# Patient Record
Sex: Female | Born: 1960 | Race: Black or African American | Hispanic: No | Marital: Single | Smoking: Former smoker
Health system: Southern US, Community
[De-identification: ages and names within clinical notes are randomized; demographics above are authoritative.]

## PROBLEM LIST (undated history)

## (undated) DIAGNOSIS — I5020 Unspecified systolic (congestive) heart failure: Secondary | ICD-10-CM

## (undated) DIAGNOSIS — F32A Depression, unspecified: Secondary | ICD-10-CM

## (undated) DIAGNOSIS — I10 Essential (primary) hypertension: Secondary | ICD-10-CM

## (undated) DIAGNOSIS — E063 Autoimmune thyroiditis: Secondary | ICD-10-CM

## (undated) DIAGNOSIS — I213 ST elevation (STEMI) myocardial infarction of unspecified site: Secondary | ICD-10-CM

## (undated) DIAGNOSIS — F1490 Cocaine use, unspecified, uncomplicated: Secondary | ICD-10-CM

## (undated) DIAGNOSIS — F329 Major depressive disorder, single episode, unspecified: Secondary | ICD-10-CM

## (undated) DIAGNOSIS — Z86010 Personal history of colonic polyps: Secondary | ICD-10-CM

## (undated) DIAGNOSIS — E785 Hyperlipidemia, unspecified: Secondary | ICD-10-CM

## (undated) DIAGNOSIS — T7840XA Allergy, unspecified, initial encounter: Secondary | ICD-10-CM

## (undated) DIAGNOSIS — I4901 Ventricular fibrillation: Secondary | ICD-10-CM

## (undated) HISTORY — DX: Depression, unspecified: F32.A

## (undated) HISTORY — DX: Ventricular fibrillation: I49.01

## (undated) HISTORY — DX: Major depressive disorder, single episode, unspecified: F32.9

## (undated) HISTORY — DX: Personal history of colonic polyps: Z86.010

## (undated) HISTORY — DX: Hyperlipidemia, unspecified: E78.5

## (undated) HISTORY — DX: Allergy, unspecified, initial encounter: T78.40XA

---

## 2000-12-18 ENCOUNTER — Emergency Department (HOSPITAL_COMMUNITY): Admission: EM | Admit: 2000-12-18 | Discharge: 2000-12-19 | Payer: Self-pay | Admitting: Emergency Medicine

## 2000-12-19 ENCOUNTER — Encounter: Payer: Self-pay | Admitting: Emergency Medicine

## 2001-04-22 ENCOUNTER — Emergency Department (HOSPITAL_COMMUNITY): Admission: EM | Admit: 2001-04-22 | Discharge: 2001-04-22 | Payer: Self-pay

## 2001-05-18 ENCOUNTER — Inpatient Hospital Stay (HOSPITAL_COMMUNITY): Admission: AD | Admit: 2001-05-18 | Discharge: 2001-05-18 | Payer: Self-pay | Admitting: *Deleted

## 2002-05-10 ENCOUNTER — Emergency Department (HOSPITAL_COMMUNITY): Admission: EM | Admit: 2002-05-10 | Discharge: 2002-05-10 | Payer: Self-pay | Admitting: Emergency Medicine

## 2008-02-04 ENCOUNTER — Emergency Department (HOSPITAL_COMMUNITY): Admission: EM | Admit: 2008-02-04 | Discharge: 2008-02-04 | Payer: Self-pay | Admitting: Emergency Medicine

## 2009-02-22 ENCOUNTER — Emergency Department (HOSPITAL_COMMUNITY): Admission: EM | Admit: 2009-02-22 | Discharge: 2009-02-22 | Payer: Self-pay | Admitting: Emergency Medicine

## 2010-05-17 ENCOUNTER — Encounter: Payer: Self-pay | Admitting: Family Medicine

## 2010-06-06 ENCOUNTER — Ambulatory Visit: Payer: Self-pay | Admitting: Family Medicine

## 2010-06-06 ENCOUNTER — Encounter: Payer: Self-pay | Admitting: Family Medicine

## 2010-06-06 DIAGNOSIS — F17200 Nicotine dependence, unspecified, uncomplicated: Secondary | ICD-10-CM

## 2010-06-06 DIAGNOSIS — E785 Hyperlipidemia, unspecified: Secondary | ICD-10-CM | POA: Insufficient documentation

## 2010-06-06 DIAGNOSIS — N951 Menopausal and female climacteric states: Secondary | ICD-10-CM

## 2010-06-06 DIAGNOSIS — F329 Major depressive disorder, single episode, unspecified: Secondary | ICD-10-CM

## 2010-06-06 DIAGNOSIS — F8189 Other developmental disorders of scholastic skills: Secondary | ICD-10-CM | POA: Insufficient documentation

## 2010-06-06 HISTORY — DX: Menopausal and female climacteric states: N95.1

## 2010-06-29 ENCOUNTER — Ambulatory Visit: Payer: Self-pay | Admitting: Family Medicine

## 2010-06-29 LAB — CONVERTED CEMR LAB: Pap Smear: NEGATIVE

## 2010-07-04 ENCOUNTER — Encounter: Payer: Self-pay | Admitting: Family Medicine

## 2010-09-26 NOTE — Letter (Signed)
Summary: Generic Letter  [facility]  [address]   [place], [place] [address]   Phone: [phone]  Fax: [phone]    07/04/2010  Brooke Rivera [address] [place], [place]  [address]  Dear [name],  Your pap smear results were normal.  We will repeat the test again in 2 years.    I saw that you missed your lab appointment to have your cholesterol checked.  Please call the office and make an appointment for first thing in the morning to have your lab drawn.  Remember, you should not eat breakfast before this lab test.  This test will help me know if you are on the right amount of cholesterol medicine.    Please also remember to call and have a mammogram scheduled. I will send you another letter with your cholesterol results after you have the labs drawn.  Please contact the office if you have any questions.    Sincerely,   Ardyth Gal MD  Appended Document: Generic Letter mailed

## 2010-09-26 NOTE — Miscellaneous (Signed)
Summary: [facility]  ROI   Imported By: Clydell Hakim 06/08/2010 11:50:58  _____________________________________________________________________  External Attachment:    Type:   Image     Comment:   External Document

## 2010-09-26 NOTE — Assessment & Plan Note (Signed)
Summary: New Patient   Vital Signs:  Patient profile:   50 year old female Height:      60.75 inches Weight:      129.6 pounds BMI:     24.78 Temp:     98.5 degrees F oral Pulse rate:   70 / minute BP sitting:   155 / 80  (right arm) Cuff size:   regular  Vitals Entered By: Garen Grams LPN (June 06, 2010 3:49 PM) CC: New Patient Is Patient Diabetic? No Pain Assessment Patient in pain? no        Primary Brooke Rivera:  Ardyth Gal MD  CC:  New Patient.  History of Present Illness: Brooke Rivera says that she did not like the doctor she used to see as they never scheduled her for a full physical.  She wants to be checked out.  She has no major complaints.    Pt. takes cymbalta because she has struggled with depression previously.  Upon questioning, pt. only takes cymbalta on days when she feels sad, not every day.    She has been smoking since she was 50 years old.  She has thought about quitting but is not ready to commit to it today.   Preventive Screening-Counseling & Management  Alcohol-Tobacco     Smoking Status: current     Smoking Cessation Counseling: yes     Packs/Day: 0.25  Caffeine-Diet-Exercise     Does Patient Exercise: yes  Safety-Violence-Falls     Seat Belt Use: yes     Fall Risk: no falls in past year  Past History:  Past Medical History: Depression Hyperlipidemia Learning disability, on disability  Past Surgical History: None  Family History: Sister with DM II and CVA.  Mother died of ? MRSA bacteremia or Cancer- pt reports she had "something in her blood"  Social History: Pt is on disability, she lives alone, rents her home and uses public transportation.  She is single.  She does not drink alchohol or do drugs.  She smokes 1/4 ppd.  She exercises, walking a lot. Smoking Status:  current Packs/Day:  0.25 Ethnicity:  Black Occupation:  None Education:  9-12 yrs Transportation:  Therapist, music Residence:  Renting Does  Patient Exercise:  yes Fall Risk:  no falls in past year Risk analyst Use:  yes Alcohol:  None Sex Orientation:  Heterosexual  Review of Systems  The patient denies anorexia, weight loss, chest pain, syncope, dyspnea on exertion, peripheral edema, abdominal pain, incontinence, and difficulty walking.    Physical Exam  General:  Well-developed,well-nourished,in no acute distress; alert,appropriate and cooperative throughout examination Head:  Normocephalic and atraumatic without obvious abnormalities. No apparent alopecia or balding. Eyes:  No corneal or conjunctival inflammation noted. EOMI. Perrla.  Nose:  External nasal examination shows no deformity or inflammation. Nasal mucosa are pink and moist without lesions or exudates. Mouth:  Oral mucosa and oropharynx without lesions or exudates.  Teeth in good repair. Neck:  No deformities, masses, or tenderness noted. Lungs:  Normal respiratory effort, chest expands symmetrically. Lungs are clear to auscultation, no crackles or wheezes. Heart:  Normal rate and regular rhythm. S1 and S2 normal without gallop, murmur, click, rub or other extra sounds. Abdomen:  Bowel sounds positive,abdomen soft and non-tender without masses, organomegaly or hernias noted. Msk:  No deformity or scoliosis noted of thoracic or lumbar spine.   Extremities:  No clubbing, cyanosis, edema, or deformity noted with normal full range of motion of all joints.  Impression & Recommendations:  Problem # 1:  DEPRESSION (ICD-311) Discussed with pt it was important to take this every day, not just when she feels sad.   Her updated medication list for this problem includes:    Cymbalta 30 Mg Cpep (Duloxetine hcl) ..... One by mouth daily take every day  Problem # 2:  HYPERLIPIDEMIA (ICD-272.4) Will have pt schedule a CPE, and check cholesterol at that time.  Her updated medication list for this problem includes:    Lovastatin 20 Mg Tabs (Lovastatin) ..... One by mouth  daily  Problem # 3:  TOBACCO USER (ICD-305.1) Encouraged Brooke Rivera to think about quitting smoking and let me know when she decides she is ready.   Problem # 4:  LEARNING DISABILITY (ICD-315.2) Pt seems to have difficulty understanding medications, but seems eager to take care of herself.  I am hoping that I will be able to help her understand how to take medications properly and to get her up to date on her health maintenence exams.   Problem # 5:  Screening Breast Cancer (ICD-V76.10) Gave pt. information and asked her to schedule a mammogram at her convenience.   Complete Medication List: 1)  Lovastatin 20 Mg Tabs (Lovastatin) .... One by mouth daily 2)  Cymbalta 30 Mg Cpep (Duloxetine hcl) .... One by mouth daily take every day 3)  Estradiol-norethindrone Acet 1-0.5 Mg Tabs (Estradiol-norethindrone acet) .... One by mouth daily  Other Orders: Influenza Vaccine NON MCR (33295) Tdap => 54yrs IM (18841) Admin 1st Vaccine (66063)  Patient Instructions: 1)  It was nice to meet you today.  I want you to think about quitting smoking and when you decide you are ready, set a day that you are going to quit. 2)  You should take your cymbalta every day, no matter how you are feeling.  3)  You will get a phone call to schedule a mammogram. 4)  Make an appointment for a Complete physical exam. Please also sign a medical release for so we can get your records and previous labs from the [facility] center.    Immunizations Administered:  Influenza Vaccine # 1:    Vaccine Type: Fluvax Non-MCR    Site: left deltoid    Mfr: GlaxoSmithKline    Dose: 0.5 ml    Route: IM    Given by: Jone Baseman CMA    Exp. Date: 02/21/2011    Lot #: [id number]    VIS given: 03/21/10 version given June 06, 2010.  Tetanus Vaccine:    Vaccine Type: Tdap    Site: right deltoid    Mfr: GlaxoSmithKline    Dose: 0.5 ml    Route: IM    Given by: Jone Baseman CMA    Exp. Date:  06/15/2012    Lot #: [id number]    VIS given: 07/14/08 version given June 06, 2010.  Flu Vaccine Consent Questions:    Do you have a history of severe allergic reactions to this vaccine? no    Any prior history of allergic reactions to egg and/or gelatin? no    Do you have a sensitivity to the preservative Thimersol? no    Do you have a past history of Guillan-Barre Syndrome? no    Do you currently have an acute febrile illness? no    Have you ever had a severe reaction to latex? no    Vaccine information given and explained to patient? yes    Are you currently pregnant? no

## 2010-09-26 NOTE — Consult Note (Signed)
Summary: [facility]  [facility]   Imported By: Bradly Bienenstock 07/04/2010 16:13:18  _____________________________________________________________________  External Attachment:    Type:   Image     Comment:   External Document

## 2010-09-26 NOTE — Assessment & Plan Note (Signed)
Summary: cpe/kh   Vital Signs:  Patient profile:   50 year old female Height:      60.75 inches Weight:      130 pounds BMI:     24.86 Temp:     98.1 degrees F oral Pulse rate:   79 / minute BP sitting:   144 / 86  (right arm) Cuff size:   regular  Vitals Entered By: Jimmy Footman, CMA (June 29, 2010 1:44 PM) CC: cpe w/ pap, Depression Is Patient Diabetic? No Pain Assessment Patient in pain? no        Primary Provider:  Ardyth Gal MD  CC:  cpe w/ pap and Depression.  History of Present Illness: Physical today  GU- bump on the lip of her vagina that has been there for 6-7 months.  Says it itches, but denies pain. Denies vaginal discharge or odor.  Says she has not been sexually active in a year. Denies dysuria.   Depression- taking cymbalta every day, mood better, not feeling sad now.  No SI.   Perimenopause- Hot flashes/Night sweats, making it difficult to sleep ocasionally. Periods still coming but with spotting and irregular.   HLD- pt. says she has been trying to eat healthier. Taking medicine every day. Exercising on pretty days, walks a few miles, says she does not go outside when it its cold out. Denies chest pain, shortness of breath.   Smoking- same, says medicine/nicotine replacement to quit are too expensive.  Not ready to quit.       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 One By Mouth Daily 2)  Cymbalta 30 Mg Cpep (Duloxetine Hcl) .... One By Mouth Daily Take Every Day  Allergies (verified): No Known Drug Allergies  Review of  Systems       See HPI.   Physical Exam  General:  Well-developed,well-nourished,in no acute distress; alert,appropriate and cooperative throughout examination Head:  Normocephalic and atraumatic without obvious abnormalities. No apparent alopecia or balding. Eyes:  No corneal or conjunctival inflammation noted. EOMI. Perrla. Funduscopic exam benign, without hemorrhages, exudates or papilledema. Vision grossly normal. Mouth:  Oral mucosa and oropharynx without lesions or exudates.  Poor dentition. Neck:  No deformities, masses, or tenderness noted. Breasts:  No mass, nodules, thickening, tenderness, bulging, retraction, inflamation, nipple discharge or skin changes noted.   Lungs:  Normal respiratory effort, chest expands symmetrically. Lungs are clear to auscultation, no crackles or wheezes. Heart:  Normal rate and regular rhythm. S1 and S2 normal without gallop, murmur, click, rub or other extra sounds. Abdomen:  Bowel sounds positive,abdomen soft and non-tender without masses, organomegaly or hernias noted. Genitalia:  Normal introitus for age, Sebaceous cyst about 1cm in diameter on edge of hairline on anterior left side of labia majora., no vaginal discharge, mucosa pink and moist, no vaginal or cervical lesions, no vaginal atrophy, no friaility or hemorrhage, normal uterus size and position, no adnexal masses or tenderness   Impression & Recommendations:  Problem # 1:  SCREENING FOR MALIGNANT NEOPLASM OF THE CERVIX (ICD-V76.2) Normal female exam except sebaceous cyst (see below).  Pap smear performed.  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  Pt. denies pain.  Told her it could be drained and excised, she deferred.  Will follow.  Orders: [facility] - Est  40-64 yrs ([address]62)  Problem # 3:  HYPERLIPIDEMIA (ICD-272.4) Pt exercising and trying to eat low cholesterol diet.  Have not been successful in getting records  from previous doctor of last lipid panel.  Will order fasting lipid panel and adjust medication if needed.  Her updated medication list for this problem includes:    Lovastatin 20 Mg Tabs (Lovastatin) ..... One by mouth daily  Orders: [facility] - Est  40-64 yrs ([address]396)Future Orders: Lipid-[facility] ([id number]) ... 06/29/2011  Problem # 4:  HOT FLASHES (ICD-627.2) Pt has previously been on estrogen replacement for hot flashes.  She says they are still bothering her but are not that bad since she has stopped the HRT.  Will not restart at this time since pt. tolerating menopause symptoms without medications.  The following medications were removed from the medication list:    Estradiol-norethindrone Acet 1-0.5 Mg Tabs (Estradiol-norethindrone acet) ..... One by mouth daily  Problem # 5:  DEPRESSION (ICD-311) Improved with medication compliance. Will continue current dosage and monitor.  Her updated medication list for this problem includes:    Cymbalta 30 Mg Cpep (Duloxetine hcl) ..... One by mouth daily take every day  Complete Medication List: 1)  Lovastatin 20 Mg Tabs (Lovastatin) .... One by mouth daily 2)  Cymbalta 30 Mg Cpep (Duloxetine hcl) .... One by mouth daily take every day  Other Orders: Pap Smear-[facility] ([id number])  Patient Instructions: 1)  It was good to see you.  Continue to exercise several times a week, and think about quitting smoking.   2)  I will send you a letter with your Pap results, it should take about two weeks to get the test results back. 3)  I want to check your cholesterol.  Please make an appointment to get fasting labs drawn- that means first thing in the morning with no breakfast before.   4)  Please schedule a mammogram as we discussed last visit.    Orders Added: 1)  Pap Smear-[facility] [[id number]] 2)  Lipid-[facility] [[id number]] 3)  [facility] - Est  40-64 yrs [[id number]]     Prevention & Chronic Care Immunizations   Influenza vaccine: Fluvax Non-MCR  (06/06/2010)     Tetanus booster: 06/06/2010: Tdap    Pneumococcal vaccine: Not documented  Colorectal Screening   Hemoccult: Not documented    Colonoscopy: Not documented  Other Screening   Pap smear: Not documented   Pap smear action/deferral: Ordered  (06/29/2010)    Mammogram: Not documented   Mammogram action/deferral: Ordered  (06/29/2010)   Smoking status: current  (06/29/2010)   Smoking cessation counseling: yes  (06/06/2010)  Lipids   Total Cholesterol: Not documented   LDL: Not documented   LDL Direct: Not documented   HDL: Not documented   Triglycerides: Not documented    SGOT (AST): Not documented   SGPT (ALT): Not documented   Alkaline phosphatase: Not documented   Total bilirubin: Not documented    Lipid flowsheet reviewed?: Yes   Progress toward LDL goal: Unchanged    Stage of readiness to change (lipid management): Action

## 2011-01-02 ENCOUNTER — Ambulatory Visit (INDEPENDENT_AMBULATORY_CARE_PROVIDER_SITE_OTHER): Payer: Medicare Other | Admitting: Family Medicine

## 2011-01-02 ENCOUNTER — Encounter: Payer: Self-pay | Admitting: Family Medicine

## 2011-01-02 VITALS — BP 157/98 | HR 74 | Temp 98.8°F | Ht 61.0 in | Wt 128.1 lb

## 2011-01-02 DIAGNOSIS — I10 Essential (primary) hypertension: Secondary | ICD-10-CM | POA: Insufficient documentation

## 2011-01-02 DIAGNOSIS — N951 Menopausal and female climacteric states: Secondary | ICD-10-CM

## 2011-01-02 DIAGNOSIS — E785 Hyperlipidemia, unspecified: Secondary | ICD-10-CM

## 2011-01-02 DIAGNOSIS — F17200 Nicotine dependence, unspecified, uncomplicated: Secondary | ICD-10-CM

## 2011-01-02 LAB — BASIC METABOLIC PANEL
BUN: 14 mg/dL (ref 6–23)
Chloride: 100 mEq/L (ref 96–112)
Creat: 0.99 mg/dL (ref 0.40–1.20)
Glucose, Bld: 91 mg/dL (ref 70–99)
Potassium: 3.7 mEq/L (ref 3.5–5.3)

## 2011-01-02 LAB — LIPID PANEL
Cholesterol: 251 mg/dL — ABNORMAL HIGH (ref 0–200)
Triglycerides: 169 mg/dL — ABNORMAL HIGH (ref <150)
VLDL: 34 mg/dL (ref 0–40)

## 2011-01-02 MED ORDER — NICOTINE 7 MG/24HR TD PT24: 1.0000 | MEDICATED_PATCH | TRANSDERMAL | Status: AC

## 2011-01-02 MED ORDER — HYDROCHLOROTHIAZIDE 25 MG PO TABS: 25.0000 mg | ORAL_TABLET | Freq: Every day | ORAL | Status: DC

## 2011-01-02 NOTE — Patient Instructions (Signed)
It was good to see you.  I am starting you on a blood pressure medication because have high blood pressure. Your blood pressure today was BP: 157/98 mmHg.  Remember your goal blood pressure is about 120/80.  Please be sure to take your medication every day.  I am going to check your cholesterol, blood sugar, and kidney function today, and I will send you a letter with the results.  Please make a visit to see me in about 3 months to make sure your blood pressure is better.   We talked about quitting smoking today, and you said you are planning to quit next Monday, May 14th.  I have sent a prescription to the pharmacy for nicotine patches.  Quitting smoking is hard, but I know you can do this!

## 2011-01-02 NOTE — Progress Notes (Signed)
  Subjective:    Patient ID: Brooke Rivera, female    DOB: Jul 08, 1961, 50 y.o.   MRN: [id number]  HPI  Pt presents for follow up and to continue to get up to date on her health maintenance.  She says that she never got her cholesterol drawn as she had a death in the family and never got around to it.  She says she has been trying to eat better so her cholesterol would be lower- she has been baking chicken instead of eating fried chicken.   She has never had high blood pressure before.  She denies chest pain, shortness of breath, headaches, palpitations, or other related symptoms.   Pt complains that she continues to have "hot flashes."  She says that at night she is warm and sweats and it keeps her up at night.  She went through menopause nearly ten years ago, and these "hot flashes" have been becoming more frequent within the past six months.  She does not associate the symptoms with any stress or sadness in her life right now.  Tobacco use- patient states she is ready to quit smoking.  She wants to quit the day after mothers day, and wants to try patches.   Pt complains that she continues to have a small cyst on her right labia.  She says it does not hurt but sometimes irritates her when she wipes.  She says she does not have an interest in having it lanced and drained, but just wants to make sure it looks OK to me.   Review of Systems  Constitutional: Negative for fever and unexpected weight change.  HENT: Negative for rhinorrhea.   Eyes: Negative for visual disturbance.  Respiratory: Negative for shortness of breath.   Cardiovascular: Negative for chest pain.  Gastrointestinal: Negative for abdominal pain.  Genitourinary: Negative for vaginal bleeding.  Musculoskeletal: Positive for arthralgias.  Neurological: Negative for dizziness.  Psychiatric/Behavioral: Negative for dysphoric mood.       Objective:   Physical Exam  Constitutional: She appears well-developed and  well-nourished. No distress.  Eyes: Conjunctivae and EOM are normal. Pupils are equal, round, and reactive to light.  Neck: No thyromegaly present.  Cardiovascular: Normal rate and regular rhythm.   No murmur heard. Pulmonary/Chest: Breath sounds normal. No respiratory distress.  Genitourinary: Vagina normal.       Pt has 1 cm diameter fluid filled cyst on left labia, unchanged from last visit          Assessment & Plan:

## 2011-01-03 ENCOUNTER — Encounter: Payer: Self-pay | Admitting: Family Medicine

## 2011-01-03 DIAGNOSIS — I10 Essential (primary) hypertension: Secondary | ICD-10-CM | POA: Insufficient documentation

## 2011-01-03 NOTE — Assessment & Plan Note (Signed)
Pt with two consecutive elevated readings in the office, as well as a family history.  Will start HCTZ and order BMET to have baseline sodium and creatine

## 2011-01-03 NOTE — Assessment & Plan Note (Signed)
Pt wants to quit.  She has set a goal for next Monday, the day after Mother's day.  She wants to use nicotine patches to help. Rx sent to pharmacy.

## 2011-01-03 NOTE — Assessment & Plan Note (Signed)
Patient complains of hot flashes frequently, however reports that she went through menopause about 10 years ago and has had no other signs of hormonal change such as vaginal bleeding.  I feel this may be due to her hypertension, and not hormones.  Will plan to start HTN medication and see if symptoms improve.

## 2011-01-03 NOTE — Assessment & Plan Note (Signed)
Pt with two elevated BP's in the office, and family history of HTN.  Will start HCTZ and have her follow up to ensure bp under better control.

## 2011-01-03 NOTE — Assessment & Plan Note (Addendum)
Pt with diagnosis of HLD from prior PCP however she never started statin.  Will check fasting lipid panel and plan to start medication if appropriate. Will check BMET to follow fasting glucose as well.

## 2011-01-16 ENCOUNTER — Encounter: Payer: Self-pay | Admitting: Family Medicine

## 2011-01-16 ENCOUNTER — Other Ambulatory Visit: Payer: Self-pay | Admitting: Family Medicine

## 2011-01-16 DIAGNOSIS — E785 Hyperlipidemia, unspecified: Secondary | ICD-10-CM

## 2011-01-16 MED ORDER — SIMVASTATIN 40 MG PO TABS: 40.0000 mg | ORAL_TABLET | Freq: Every day | ORAL | Status: DC

## 2011-04-04 ENCOUNTER — Ambulatory Visit: Payer: Medicaid Other | Admitting: Family Medicine

## 2011-04-04 ENCOUNTER — Ambulatory Visit (INDEPENDENT_AMBULATORY_CARE_PROVIDER_SITE_OTHER): Payer: Medicaid Other | Admitting: Family Medicine

## 2011-04-04 ENCOUNTER — Encounter: Payer: Self-pay | Admitting: Family Medicine

## 2011-04-04 VITALS — BP 124/81 | HR 93 | Temp 97.6°F | Wt 127.0 lb

## 2011-04-04 DIAGNOSIS — K219 Gastro-esophageal reflux disease without esophagitis: Secondary | ICD-10-CM

## 2011-04-04 DIAGNOSIS — E785 Hyperlipidemia, unspecified: Secondary | ICD-10-CM

## 2011-04-04 DIAGNOSIS — I10 Essential (primary) hypertension: Secondary | ICD-10-CM

## 2011-04-04 DIAGNOSIS — F17200 Nicotine dependence, unspecified, uncomplicated: Secondary | ICD-10-CM

## 2011-04-04 MED ORDER — NICOTINE 7 MG/24HR TD PT24: 1.0000 | MEDICATED_PATCH | TRANSDERMAL | Status: AC

## 2011-04-04 NOTE — Assessment & Plan Note (Signed)
Advised taking tums for infrequent pain, advised low fat diet, not eating too close to bedtime.  If symptoms worsen would consider omeprazole daily.

## 2011-04-04 NOTE — Assessment & Plan Note (Signed)
Well controlled on current medication, continue HCTZ.

## 2011-04-04 NOTE — Patient Instructions (Signed)
It was good to see you.  Your blood pressure today was BP: 124/81 mmHg.  Remember your goal blood pressure is about 120/80, Manufacturing engineer.  Please be sure to take your medication every day.    I have sent a prescription to your pharmacy for your new cholesterol medication and for nicotine patches.  Please make an appointment to see me in 3-6 months to recheck your cholesterol.

## 2011-04-04 NOTE — Assessment & Plan Note (Signed)
Pt. interested in quitting, Rx nicotine patches. Encouraged her.

## 2011-04-04 NOTE — Progress Notes (Signed)
  Subjective:    Patient ID: Brooke Rivera, female    DOB: [birth year], 50 y.o.   MRN: [id number]  HPI  Patient presents for follow up.  She did not receive my letter about her cholesterol.  Discussed her high cholesterol.  She says she has been trying to stay away from fried foods.   She is not having any side-effects from her blood pressure medications.  No chest pain or shortness of breath or palpitations.  She does complain about some epigastric pain if she eats and then lays down after.  It only happens once or twice a week, and it is usually at night time.  Not exertional.    Patient did not quit smoking as she had hoped to.  She says a pack of cigarettes lasts 4-5 days, but she wants to quit totally.    Review of Systems Negative except HPI.     Objective:   Physical Exam BP 124/81  Pulse 93  Temp(Src) 97.6 F (36.4 C) (Oral)  Wt 127 lb (57.607 kg) General appearance: alert, cooperative and no distress Throat: poor dentition, s/p tooth removal, no lesions or exudates Lungs: clear to auscultation bilaterally Heart: regular rate and rhythm, S1, S2 normal, no murmur, click, rub or gallop Abdomen: soft, non-tender; bowel sounds normal; no masses,  no organomegaly Extremities: extremities normal, atraumatic, no cyanosis or edema Pulses: 2+ and symmetric Skin: Skin color, texture, turgor normal. No rashes or lesions Neurologic: Grossly normal       Assessment & Plan:  HYPERLIPIDEMIA Start HCTZ, gave hand out on diet and exercise for high cholesterol.  Follow up in 3 months to re-check cholesterol.   Hypertension, benign Well controlled on current medication, continue HCTZ.   GERD (gastroesophageal reflux disease) Advised taking tums for infrequent pain, advised low fat diet, not eating too close to bedtime.  If symptoms worsen would consider omeprazole daily.   TOBACCO USER Pt. interested in quitting, Rx nicotine patches. Encouraged her.

## 2011-04-04 NOTE — Assessment & Plan Note (Signed)
Start HCTZ, gave hand out on diet and exercise for high cholesterol.  Follow up in 3 months to re-check cholesterol.

## 2011-05-24 LAB — URINALYSIS, ROUTINE W REFLEX MICROSCOPIC
Glucose, UA: NEGATIVE
Protein, ur: 30 — AB
Urobilinogen, UA: 1

## 2011-05-24 LAB — URINE MICROSCOPIC-ADD ON

## 2011-05-31 ENCOUNTER — Other Ambulatory Visit: Payer: Self-pay | Admitting: Family Medicine

## 2011-05-31 DIAGNOSIS — I10 Essential (primary) hypertension: Secondary | ICD-10-CM

## 2011-05-31 MED ORDER — HYDROCHLOROTHIAZIDE 25 MG PO TABS: 25.0000 mg | ORAL_TABLET | Freq: Every day | ORAL | Status: DC

## 2011-06-05 ENCOUNTER — Other Ambulatory Visit: Payer: Self-pay | Admitting: Family Medicine

## 2011-06-05 DIAGNOSIS — I10 Essential (primary) hypertension: Secondary | ICD-10-CM

## 2011-06-05 MED ORDER — HYDROCHLOROTHIAZIDE 25 MG PO TABS: 25.0000 mg | ORAL_TABLET | Freq: Every day | ORAL | Status: DC

## 2011-07-06 ENCOUNTER — Ambulatory Visit (INDEPENDENT_AMBULATORY_CARE_PROVIDER_SITE_OTHER): Payer: Medicaid Other | Admitting: Family Medicine

## 2011-07-06 ENCOUNTER — Encounter: Payer: Self-pay | Admitting: Family Medicine

## 2011-07-06 ENCOUNTER — Other Ambulatory Visit (HOSPITAL_COMMUNITY)
Admission: RE | Admit: 2011-07-06 | Discharge: 2011-07-06 | Disposition: A | Discharge status: Home / Self Care | Payer: Medicare Other | Source: Ambulatory Visit | Attending: Family Medicine | Admitting: Family Medicine

## 2011-07-06 VITALS — BP 110/74 | HR 88 | Temp 97.9°F | Ht 61.0 in | Wt 129.7 lb

## 2011-07-06 DIAGNOSIS — F3289 Other specified depressive episodes: Secondary | ICD-10-CM

## 2011-07-06 DIAGNOSIS — Z23 Encounter for immunization: Secondary | ICD-10-CM

## 2011-07-06 DIAGNOSIS — I10 Essential (primary) hypertension: Secondary | ICD-10-CM

## 2011-07-06 DIAGNOSIS — Z01419 Encounter for gynecological examination (general) (routine) without abnormal findings: Secondary | ICD-10-CM | POA: Insufficient documentation

## 2011-07-06 DIAGNOSIS — E785 Hyperlipidemia, unspecified: Secondary | ICD-10-CM

## 2011-07-06 DIAGNOSIS — Z0000 Encounter for general adult medical examination without abnormal findings: Secondary | ICD-10-CM

## 2011-07-06 DIAGNOSIS — F329 Major depressive disorder, single episode, unspecified: Secondary | ICD-10-CM

## 2011-07-06 DIAGNOSIS — Z124 Encounter for screening for malignant neoplasm of cervix: Secondary | ICD-10-CM

## 2011-07-06 MED ORDER — DULOXETINE HCL 30 MG PO CPEP: 30.0000 mg | ORAL_CAPSULE | Freq: Every day | ORAL | Status: DC

## 2011-07-06 NOTE — Assessment & Plan Note (Signed)
Pt with poor mood since stopping cymbalta.  Discussed that she should not abruptly stop mediation.  Cymbalta refilled.

## 2011-07-06 NOTE — Assessment & Plan Note (Signed)
Will check Direct LDL, continue Simvastatin.

## 2011-07-06 NOTE — Assessment & Plan Note (Signed)
Pap done today.  Flu Shot today. Recommend Mammogram and Colonoscopy, patient was given hand outs on how to schedule each.

## 2011-07-06 NOTE — Progress Notes (Signed)
  Subjective:    Patient ID: Brooke Rivera, female    DOB: 11-20-1960, 50 y.o.   MRN: [id number]  HPI  Ms. Brooke Rivera comes in for followup and health maintenance. She has never had a mammogram, or colonoscopy, and she is overdue for Pap smear. She denies family history of cancers, and denies any lumps or bumps in her breasts, denies our blood in her stool.  The patient is taking her simvastatin without difficulty. She denies any side effects. She also says she has been trying he healthier diet.  The patient says that she ran out of her Cymbalta. She says that since she stopped taking the medicine, she has had more sad days, where she wants to stay in bed. She would like to restart the Cymbalta.  Ms. Brooke Rivera says she has taken her blood pressure medicine without difficulty. She denies any chest pain, palpitations, dizziness.  Review of Systems  Constitutional: Negative for unexpected weight change.  HENT: Positive for congestion.   Eyes: Negative for visual disturbance.  Respiratory: Negative for shortness of breath.   Cardiovascular: Negative for chest pain.  Gastrointestinal: Negative for blood in stool.  Genitourinary: Negative for vaginal discharge and menstrual problem.  Skin: Negative for rash.  Neurological: Negative for dizziness.  Hematological: Negative for adenopathy.  Psychiatric/Behavioral: Positive for dysphoric mood.       Objective:   Physical Exam  BP 110/74  Pulse 88  Temp(Src) 97.9 F (36.6 C) (Oral)  Ht 5\' 1"  (1.549 m)  Wt 129 lb 11.2 oz (58.832 kg)  BMI 24.51 kg/m2 General appearance: alert, cooperative and no distress Eyes: conjunctivae/corneas clear. PERRL, EOM's intact. Fundi benign. Nose: mild congestion Throat: lips, mucosa, and tongue normal; teeth and gums normal Neck: no adenopathy, no JVD, supple, symmetrical, trachea midline and thyroid not enlarged, symmetric, no tenderness/mass/nodules Lungs: clear to auscultation bilaterally Breasts: normal  appearance, no masses or tenderness Heart: regular rate and rhythm, S1, S2 normal, no murmur, click, rub or gallop Abdomen: soft, non-tender; bowel sounds normal; no masses,  no organomegaly Pelvic: cervix normal in appearance, no adnexal masses or tenderness, no cervical motion tenderness, rectovaginal septum normal, uterus normal size, shape, and consistency and vagina normal without discharge Extremities: extremities normal, atraumatic, no cyanosis or edema      Assessment & Plan:

## 2011-07-06 NOTE — Patient Instructions (Addendum)
It was good to see you today.  I will send you a letter with the results of your pap and the cholesterol test.  I am going to put in a referral for you to have a Colonoscopy screening test.  The office will contact you about this.  Also, you need to have a mammogram.  Please call the [facility] or the Breast center to have that done.

## 2011-07-06 NOTE — Assessment & Plan Note (Signed)
Well controlled on HCTZ. Continue current dose.

## 2011-07-09 ENCOUNTER — Encounter: Payer: Self-pay | Admitting: Family Medicine

## 2011-07-12 ENCOUNTER — Encounter: Payer: Self-pay | Admitting: Family Medicine

## 2011-11-21 ENCOUNTER — Encounter (HOSPITAL_COMMUNITY): Payer: Self-pay | Admitting: *Deleted

## 2011-11-21 ENCOUNTER — Emergency Department (HOSPITAL_COMMUNITY): Payer: Medicare Other

## 2011-11-21 ENCOUNTER — Emergency Department (HOSPITAL_COMMUNITY)
Admission: EM | Admit: 2011-11-21 | Discharge: 2011-11-21 | Disposition: A | Discharge status: Home / Self Care | Payer: Medicare Other | Attending: Emergency Medicine | Admitting: Emergency Medicine

## 2011-11-21 DIAGNOSIS — R000 Tachycardia, unspecified: Secondary | ICD-10-CM | POA: Insufficient documentation

## 2011-11-21 DIAGNOSIS — I10 Essential (primary) hypertension: Secondary | ICD-10-CM | POA: Insufficient documentation

## 2011-11-21 DIAGNOSIS — R10819 Abdominal tenderness, unspecified site: Secondary | ICD-10-CM | POA: Insufficient documentation

## 2011-11-21 DIAGNOSIS — R109 Unspecified abdominal pain: Secondary | ICD-10-CM | POA: Insufficient documentation

## 2011-11-21 DIAGNOSIS — E785 Hyperlipidemia, unspecified: Secondary | ICD-10-CM | POA: Insufficient documentation

## 2011-11-21 HISTORY — DX: Essential (primary) hypertension: I10

## 2011-11-21 LAB — URINE MICROSCOPIC-ADD ON

## 2011-11-21 LAB — URINALYSIS, ROUTINE W REFLEX MICROSCOPIC
Bilirubin Urine: NEGATIVE
Glucose, UA: NEGATIVE mg/dL
Ketones, ur: NEGATIVE mg/dL
Protein, ur: NEGATIVE mg/dL
pH: 5.5 (ref 5.0–8.0)

## 2011-11-21 MED ORDER — DICYCLOMINE HCL 20 MG PO TABS: 20.0000 mg | ORAL_TABLET | Freq: Two times a day (BID) | ORAL | Status: AC

## 2011-11-21 MED ORDER — GI COCKTAIL ~~LOC~~
30.0000 mL | Freq: Once | ORAL | Status: AC
  Administered 2011-11-21: 30 mL via ORAL
  Filled 2011-11-21: qty 30

## 2011-11-21 MED ORDER — FAMOTIDINE 20 MG PO TABS
20.0000 mg | ORAL_TABLET | Freq: Once | ORAL | Status: AC
  Administered 2011-11-21: 20 mg via ORAL
  Filled 2011-11-21: qty 1

## 2011-11-21 NOTE — ED Provider Notes (Signed)
History     CSN: [id number]  Arrival date & time 11/21/11  1020   First MD Initiated Contact with Patient 11/21/11 1206      Chief Complaint  Patient presents with  . Abdominal Pain    (Consider location/radiation/quality/duration/timing/severity/associated sxs/prior treatment) Patient is a 51 y.o. female presenting with abdominal pain. The history is provided by the patient.  Abdominal Pain The primary symptoms of the illness include abdominal pain.   patient presents with lower abdominal pain x2 days. Pain is sharp and has been intermittent. Symptoms not made worse or better with anything and no associated urinary symptoms. No vaginal bleeding or discharge. No vomiting or diarrhea. No prior history of same. No prior surgical history. Does note some constipation and took Pepto-Bismol with no relief  Past Medical History  Diagnosis Date  . Hyperlipidemia   . Hypertension     History reviewed. No pertinent past surgical history.  Family History  Problem Relation Age of Onset  . Hyperlipidemia Mother     History  Substance Use Topics  . Smoking status: Current Everyday Smoker -- 0.3 packs/day    Types: Cigarettes  . Smokeless tobacco: Never Used  . Alcohol Use: Yes     occ    OB History    Grav Para Term Preterm Abortions TAB SAB Ect Mult Living                  Review of Systems  Gastrointestinal: Positive for abdominal pain.  All other systems reviewed and are negative.    Allergies  Review of patient's allergies indicates no known allergies.  Home Medications   Current Outpatient Rx  Name Route Sig Dispense Refill  . DULOXETINE HCL 30 MG PO CPEP Oral Take 1 capsule (30 mg total) by mouth daily. 30 capsule 5  . HYDROCHLOROTHIAZIDE 25 MG PO TABS Oral Take 1 tablet (25 mg total) by mouth daily. 30 tablet 5  . SIMVASTATIN 40 MG PO TABS Oral Take 1 tablet (40 mg total) by mouth at bedtime. 30 tablet 11    BP 129/85  Pulse 102  Temp(Src) 97.5 F (36.4  C) (Oral)  Resp 18  SpO2 100%  Physical Exam  Nursing note and vitals reviewed. Constitutional: She is oriented to person, place, and time. She appears well-developed and well-nourished.  Non-toxic appearance. No distress.  HENT:  Head: Normocephalic and atraumatic.  Eyes: Conjunctivae, EOM and lids are normal. Pupils are equal, round, and reactive to light.  Neck: Normal range of motion. Neck supple. No tracheal deviation present. No mass present.  Cardiovascular: Regular rhythm and normal heart sounds.  Tachycardia present.  Exam reveals no gallop.   No murmur heard. Pulmonary/Chest: Effort normal and breath sounds normal. No stridor. No respiratory distress. She has no decreased breath sounds. She has no wheezes. She has no rhonchi. She has no rales.  Abdominal: Soft. Normal appearance and bowel sounds are normal. She exhibits no distension. There is tenderness in the suprapubic area. There is no rigidity, no rebound, no guarding and no CVA tenderness.  Musculoskeletal: Normal range of motion. She exhibits no edema and no tenderness.  Neurological: She is alert and oriented to person, place, and time. She has normal strength. No cranial nerve deficit or sensory deficit. GCS eye subscore is 4. GCS verbal subscore is 5. GCS motor subscore is 6.  Skin: Skin is warm and dry. No abrasion and no rash noted.  Psychiatric: She has a normal mood and affect. Her speech  is normal and behavior is normal.    ED Course  Procedures (including critical care time)   Labs Reviewed  URINALYSIS, ROUTINE W REFLEX MICROSCOPIC   No results found.   No diagnosis found.    MDM  Labs and x-rays reviewed. Patient's abdomen reexamined, discharge and remains nonsurgical. Will be given prescription for anti-spasmatics and d/c        Toy Baker, MD 11/21/11 1316

## 2011-11-21 NOTE — Discharge Instructions (Signed)

## 2011-11-21 NOTE — Progress Notes (Signed)
Contacted by attending nurse, Silva Bandy, to provide medication assistance. After reviewing patient's chart I informed nurse that we cannot provide assistance because patient has both Medicare and Medicaid.

## 2011-11-21 NOTE — ED Notes (Signed)
Pt is here with lower abd pain for last 2 days.  Pt had some constipation and took Pepto and then pains started.  Pt sts bowel movement runny.  No urinary or vaginal symptoms.  LMP-menopause no menses

## 2012-01-01 ENCOUNTER — Ambulatory Visit: Payer: Medicare Other | Admitting: Family Medicine

## 2012-02-06 ENCOUNTER — Ambulatory Visit (INDEPENDENT_AMBULATORY_CARE_PROVIDER_SITE_OTHER): Payer: Medicare Other | Admitting: Family Medicine

## 2012-02-06 ENCOUNTER — Encounter: Payer: Self-pay | Admitting: Family Medicine

## 2012-02-06 VITALS — BP 128/83 | HR 80 | Temp 98.3°F | Ht 61.0 in | Wt 132.0 lb

## 2012-02-06 DIAGNOSIS — F17200 Nicotine dependence, unspecified, uncomplicated: Secondary | ICD-10-CM

## 2012-02-06 DIAGNOSIS — E785 Hyperlipidemia, unspecified: Secondary | ICD-10-CM

## 2012-02-06 DIAGNOSIS — I10 Essential (primary) hypertension: Secondary | ICD-10-CM

## 2012-02-06 DIAGNOSIS — Z0000 Encounter for general adult medical examination without abnormal findings: Secondary | ICD-10-CM

## 2012-02-06 LAB — BASIC METABOLIC PANEL
CO2: 26 mEq/L (ref 19–32)
Calcium: 9.7 mg/dL (ref 8.4–10.5)
Chloride: 103 mEq/L (ref 96–112)
Creat: 0.87 mg/dL (ref 0.50–1.10)
Glucose, Bld: 94 mg/dL (ref 70–99)

## 2012-02-06 LAB — LIPID PANEL
Cholesterol: 254 mg/dL — ABNORMAL HIGH (ref 0–200)
HDL: 46 mg/dL (ref >39)
Triglycerides: 132 mg/dL (ref <150)

## 2012-02-06 MED ORDER — HYDROCHLOROTHIAZIDE 12.5 MG PO TABS: 12.5000 mg | ORAL_TABLET | Freq: Every day | ORAL | Status: DC

## 2012-02-06 MED ORDER — SIMVASTATIN 40 MG PO TABS: 40.0000 mg | ORAL_TABLET | Freq: Every day | ORAL | Status: DC

## 2012-02-06 NOTE — Assessment & Plan Note (Signed)
BP has been elevated in past, but near normal today off meds.  Discussed risks with patient, she agrees to re-start HCTZ at 12.5 (lower) dose.

## 2012-02-06 NOTE — Patient Instructions (Signed)
It was good to see you.  Please try to schedule your Mammogram.  Also, I will put in a referral for you to see the Gastroenterologist so you can have your screening colonoscopy.    I have refilled your blood pressure and cholesterol medications.  Please get them filled as soon as you can.  I will send you a letter with your lab results.

## 2012-02-06 NOTE — Assessment & Plan Note (Signed)
Continue simvastatin, stressed importance of taking it every day.  Will check fasting labs today.

## 2012-02-06 NOTE — Progress Notes (Signed)
  Subjective:    Patient ID: Brooke Rivera, female    DOB: 14-May-1961, 51 y.o.   MRN: [id number]  HPI  Brooke Rivera comes in for 6 month follow up.    Tobacco use- patient is smoking a pack over 4-5 days.  She says she is trying to slowly cut back, and eventually wants to quit, but is not ready yet.  She is not intersted in nicotine replacement.   HLD- patient says she is sometimes taking her simvastatin.  She is not having side effects from it, but says it is hard for her to afford her medications.  LDL on simvastatin was 110 6 months ago.   HTN- patient self-discontinued HCTZ because she did not think she needed it.  She denies any chest pain, dyspnea, palpitations, or LE edema.    Review of Systems Pertinent items in HPI    Objective:   Physical Exam BP 128/83  Pulse 80  Temp 98.3 F (36.8 C) (Oral)  Ht 5\' 1"  (1.549 m)  Wt 132 lb (59.875 kg)  BMI 24.94 kg/m2 General appearance: alert, cooperative and no distress Eyes: conjunctivae/corneas clear. PERRL, EOM's intact. Fundi benign. Neck: no adenopathy, no JVD, supple, symmetrical, trachea midline and thyroid not enlarged, symmetric, no tenderness/mass/nodules Lungs: clear to auscultation bilaterally Heart: regular rate and rhythm, S1, S2 normal, no murmur, click, rub or gallop Extremities: extremities normal, atraumatic, no cyanosis or edema       Assessment & Plan:

## 2012-02-06 NOTE — Assessment & Plan Note (Signed)
Again, reccommended mammogram and colonoscopy. Will make referral for colonoscopy, she was given info to schedule mammogram.

## 2012-02-06 NOTE — Assessment & Plan Note (Addendum)
Patient slowly decreasing the amount she smokes, not ready to completely quit. Counseling provided today, will continue to encourage her.

## 2012-02-07 ENCOUNTER — Encounter: Payer: Self-pay | Admitting: Family Medicine

## 2012-08-08 ENCOUNTER — Encounter: Payer: Self-pay | Admitting: Family Medicine

## 2012-08-08 ENCOUNTER — Ambulatory Visit (INDEPENDENT_AMBULATORY_CARE_PROVIDER_SITE_OTHER): Payer: Medicare Other | Admitting: Family Medicine

## 2012-08-08 VITALS — BP 121/79 | HR 97 | Temp 97.9°F | Ht 61.0 in | Wt 136.0 lb

## 2012-08-08 DIAGNOSIS — F329 Major depressive disorder, single episode, unspecified: Secondary | ICD-10-CM

## 2012-08-08 DIAGNOSIS — E785 Hyperlipidemia, unspecified: Secondary | ICD-10-CM

## 2012-08-08 DIAGNOSIS — Z23 Encounter for immunization: Secondary | ICD-10-CM

## 2012-08-08 DIAGNOSIS — I10 Essential (primary) hypertension: Secondary | ICD-10-CM

## 2012-08-08 DIAGNOSIS — F17200 Nicotine dependence, unspecified, uncomplicated: Secondary | ICD-10-CM

## 2012-08-08 LAB — BASIC METABOLIC PANEL
BUN: 10 mg/dL (ref 6–23)
CO2: 28 mEq/L (ref 19–32)
Calcium: 10.4 mg/dL (ref 8.4–10.5)
Chloride: 98 mEq/L (ref 96–112)
Creat: 0.9 mg/dL (ref 0.50–1.10)

## 2012-08-08 LAB — LDL CHOLESTEROL, DIRECT: Direct LDL: 119 mg/dL — ABNORMAL HIGH

## 2012-08-08 NOTE — Progress Notes (Signed)
  Subjective:    Patient ID: Brooke Rivera, female    DOB: 10-16-1960, 51 y.o.   MRN: [id number]  HPI  Brooke Rivera comes in for follow up.  She is doing relatively well.    HTN: Taking HCTZ without difficulty.  Denies chest pain, dizziness, palpitations, LE edema.  Patient does not check blood pressures.    HLD: Patient is taking simvastatin without difficulty.  Last lipid profile was about 6 months ago.  Patient is is making lifestyle modifications, watching her diet and exercising.    Depression: not taking cymbalta, but mood has generally improved.  She still has days that she is feeling down, sad, but no SI/HI.    Tobacco abuse: Has been trying to cut back, smoking about 1 pack in 4 days.  Wants to quit.   I have reviewed the patient's medical history in detail and updated the computerized patient record.  Review of Systems Pertinent items in HPI    Objective:   Physical Exam BP 121/79  Pulse 97  Temp 97.9 F (36.6 C) (Oral)  Ht 5\' 1"  (1.549 m)  Wt 136 lb (61.689 kg)  BMI 25.70 kg/m2 General appearance: alert, cooperative and no distress Neck: Thyroid mildly enlarged but non-tender Lungs: clear to auscultation bilaterally Heart: regular rate and rhythm, S1, S2 normal, no murmur, click, rub or gallop Extremities: extremities normal, atraumatic, no cyanosis or edema Pulses: 2+ and symmetric       Assessment & Plan:

## 2012-08-08 NOTE — Patient Instructions (Signed)
It was good to see you.  Your blood pressure today was BP: 121/79 mmHg.  Remember your goal blood pressure is about 130/80.  Please be sure to take your medication every day.  Great job!  Please continue to cut back on your smoking and drinking.  Also, continue to exercise.    I will send you a letter with your lab results, or call you if anything is abnormal.

## 2012-08-08 NOTE — Assessment & Plan Note (Signed)
Well controlled on HCTZ, continue at current dose.

## 2012-08-08 NOTE — Assessment & Plan Note (Signed)
Seems overall improved, continue without medications.

## 2012-08-08 NOTE — Assessment & Plan Note (Signed)
Counseled to quit, offered resources, which she declines.

## 2012-08-08 NOTE — Assessment & Plan Note (Signed)
Will check LDL, encouraged to continue exercise and watching her diet.

## 2012-08-11 ENCOUNTER — Encounter: Payer: Self-pay | Admitting: Family Medicine

## 2012-10-29 ENCOUNTER — Other Ambulatory Visit: Payer: Self-pay | Admitting: *Deleted

## 2012-10-29 DIAGNOSIS — I10 Essential (primary) hypertension: Secondary | ICD-10-CM

## 2012-10-29 MED ORDER — HYDROCHLOROTHIAZIDE 12.5 MG PO TABS: 12.5000 mg | ORAL_TABLET | Freq: Every day | ORAL | Status: DC

## 2013-02-02 ENCOUNTER — Ambulatory Visit: Payer: Medicare Other | Admitting: Family Medicine

## 2013-02-24 ENCOUNTER — Other Ambulatory Visit: Payer: Self-pay | Admitting: Family Medicine

## 2013-03-11 ENCOUNTER — Ambulatory Visit (INDEPENDENT_AMBULATORY_CARE_PROVIDER_SITE_OTHER): Payer: Medicare Other | Admitting: Family Medicine

## 2013-03-11 VITALS — BP 129/84 | HR 73 | Ht 61.0 in | Wt 136.0 lb

## 2013-03-11 DIAGNOSIS — I10 Essential (primary) hypertension: Secondary | ICD-10-CM

## 2013-03-11 DIAGNOSIS — R093 Abnormal sputum: Secondary | ICD-10-CM

## 2013-03-11 DIAGNOSIS — R0989 Other specified symptoms and signs involving the circulatory and respiratory systems: Secondary | ICD-10-CM

## 2013-03-11 DIAGNOSIS — F329 Major depressive disorder, single episode, unspecified: Secondary | ICD-10-CM

## 2013-03-11 DIAGNOSIS — E785 Hyperlipidemia, unspecified: Secondary | ICD-10-CM

## 2013-03-11 DIAGNOSIS — Z0000 Encounter for general adult medical examination without abnormal findings: Secondary | ICD-10-CM

## 2013-03-11 MED ORDER — DULOXETINE HCL 30 MG PO CPEP: 30.0000 mg | ORAL_CAPSULE | Freq: Every day | ORAL | Status: DC

## 2013-03-11 MED ORDER — DESLORATADINE 5 MG PO TABS: 5.0000 mg | ORAL_TABLET | Freq: Every day | ORAL | Status: DC

## 2013-03-11 NOTE — Progress Notes (Signed)
Patient ID: Brooke Rivera, female   DOB: [birth year], 52 y.o.   MRN: [id number]  Subjective:   CC: F/u on Wheezing, depression, HLD, and HTN  HPI:   1. Wheezing - Pt reports feeling like she always has phlegm in her throat and wonders if she has asthma. This has been occurring 2 years. She has never tried allergy medicine, but thinks it is worse when weather changes and sometimes has nasal burning. She has occasional dyspnea and thinks this could be related to smoking. This is worst when she walks a little distance and when going up stairs. Sometimes she wakes up coughing at night. She denies fever, chills, major weight changes, h/o cardiovascular disease, chest pain, leg swelling. Uses 1 pillow to sleep.  2. Depression - Pt wants a refill. She ran out of cymbalta 3 months ago and symptoms have been worse, and she thinks it had been helping. Feels like she does not want to do anything, cries a lot, but denies SI/HI. Just did not think to get it refilled. Does not see psychiatrist or therapist. Cymbalta had been started 2010, and she has never needed to be hospitalized for depression.  3. Hypertension - Pt takes medication regularly except missed 2 days around July 4 holiday. Denies vision changes, dizziness. Does not check BP at home. Gets occasional headaches at home.  4. Hyperlipidemia - Takes medication daily. Denies medication SEs. Last checked 07/2012.   Review of Systems - Per HPI.   SH: - Since 52 years old, smoked 1 pack every 4-5 days. Has thought about quitting before. - Drinks Alcohol - two 40s in 1 week - Drugs - no drug use now; h/o crack that quit 10 uyears ago.   Objective:  Physical Exam BP 129/84  Pulse 73  Ht 5\' 1"  (1.549 m)  Wt 136 lb (61.689 kg)  BMI 25.71 kg/m2 GEN: NAD, slightly down-appearing CV: RRR no m/r/g CTAB no w/r/r. Normal effort, mildly decreased air movement bilateral bases EXTR: LE no edema or tenderness NEURO: Awake, alert, no focal deficits, normal  speech and gait PSYCH: Mood is slightly down-appearing but mostly euthymic, affect euthymic    Assessment:     52 y.o. female with h/o depression, HTN, and HLD here for f/u with complaint of "wheezing."    Plan:     # See problem list for problem-specific plans. - Schedule mammogram and colonoscopy

## 2013-03-11 NOTE — Patient Instructions (Addendum)
It was great to see you!  For your depression, take cymbalta daily and follow up with me in 3-6 weeks.  For your phlegm, try the allergy medicine daily and let me know if it is too expensive. The next time I see you, if it is not working, let me know and we can stop it. - Let me know when you are ready to quit smoking. This will help in all aspects of your health.  Take medications daily as prescribed.  You need a colonoscopy and a mammogram. My nurse can help schedule this.   As you leave, make an appointment to follow up with me in 3-6 weeks.  - Bring all medications in a bag to your visits.  Take care and seek immediate care sooner than our next visit if you develop any concerns. This includes if you have chest pain, shortness of breath, severe depression, or thoughts of wanting to hurt yourself or others.

## 2013-03-14 DIAGNOSIS — R0989 Other specified symptoms and signs involving the circulatory and respiratory systems: Secondary | ICD-10-CM | POA: Insufficient documentation

## 2013-03-14 NOTE — Assessment & Plan Note (Signed)
Stable on HCTZ, well-controlled. - Continue and f/u at next visit. - Recommended checking BP at [facility] occasionally

## 2013-03-14 NOTE — Assessment & Plan Note (Addendum)
PHQ-9 scoring in moderately-severe range. Has been off cymbalta 3 months due to running out. - Re-prescribed cymbalta, and encouraged letting me know prior to running out. - Return precautions reviewed - Return in 3 weeks to f/u, at which point consider increasing medication or referring to therapist based on response

## 2013-03-14 NOTE — Assessment & Plan Note (Addendum)
DDx includes COPD, allergy, GERD. - REcommended pt start taking allergy medication - REcommended letting me know when she would like to stop smoking and I could help with resources

## 2013-03-14 NOTE — Assessment & Plan Note (Addendum)
Stable.  - Continue simvastatin 40mg  daily - Recheck lipid panel in 6 months-1 year

## 2013-04-01 ENCOUNTER — Ambulatory Visit: Payer: Medicare Other | Admitting: Family Medicine

## 2013-06-15 ENCOUNTER — Encounter: Payer: Self-pay | Admitting: Family Medicine

## 2013-06-15 ENCOUNTER — Ambulatory Visit (INDEPENDENT_AMBULATORY_CARE_PROVIDER_SITE_OTHER): Payer: Medicare Other | Admitting: Family Medicine

## 2013-06-15 VITALS — BP 120/80 | HR 80 | Temp 97.7°F | Ht 61.0 in | Wt 133.0 lb

## 2013-06-15 DIAGNOSIS — E785 Hyperlipidemia, unspecified: Secondary | ICD-10-CM

## 2013-06-15 DIAGNOSIS — I10 Essential (primary) hypertension: Secondary | ICD-10-CM

## 2013-06-15 DIAGNOSIS — F329 Major depressive disorder, single episode, unspecified: Secondary | ICD-10-CM

## 2013-06-15 MED ORDER — SIMVASTATIN 40 MG PO TABS: 40.0000 mg | ORAL_TABLET | Freq: Every day | ORAL | Status: DC

## 2013-06-15 MED ORDER — DULOXETINE HCL 30 MG PO CPEP: 30.0000 mg | ORAL_CAPSULE | Freq: Every day | ORAL | Status: DC

## 2013-06-15 MED ORDER — HYDROCHLOROTHIAZIDE 12.5 MG PO TABS: 12.5000 mg | ORAL_TABLET | Freq: Every day | ORAL | Status: DC

## 2013-06-15 NOTE — Patient Instructions (Signed)
It was good to see you today.  I have refilled your blood pressure, cholesterol, and depression medication.  Take this daily.  Follow up in December for lab only visit (be fasting) and come to see me after that visit as an appt.  Please make an appt for a colonoscopy and a mammogram.  Be sure to see the dentist.  Seek immediate care if you develop chest pain, shortness of breath, dizziness, or severe depressive symptoms or thoughts of wanting to harm yourself or others.

## 2013-06-15 NOTE — Progress Notes (Signed)
Patient ID: Brooke Rivera, female   DOB: 04-10-1961, 52 y.o.   MRN: [id number] Subjective:   CC: Follow up  HPI:   Patient is here for follow-up. She reports being on no medications since July.  1. HTN - Patient reports running out of her medications since July. She had had medication but lost it during herrecent move. She has also been stressed recently due to move and admits to not prioritizing taking her medications. She thinks she needs refills today. She denies dyspnea or chest pain. She reports some lightheadedness on standing, though reportedly drinks liquids (water and Koolaid) throughout the day. Also reports "dots" in visual field and headaches x 2 weeks. Does not check BP at home.  2. Depression - Pt reports increased stress recently followed by improved situation due to "personal things that I got away from" which she does not wish to further describe. PHQ-9 today is 18. Reports that sometimes little things bother her but that cymbalta had been helping. She has not been taking this in a few months and thinks she needs refills. Denies SI/HI.  3. Chol - Patient ran out of this medication 3 months ago and would like refills.   For all meds, pt denies side effects.   Review of Systems - Per HPI.  SH: Tobacco use: 3 cig/day x years: 13 Drugs: no h/o drug use EtOH: <1 drink / week (glass beer); no liquor Lives alone currently, stable situation, here in [place]  FH: Cancer in mother though not colon cancer, pt unsure of which  Objective:  Physical Exam BP 120/80  Pulse 80  Temp(Src) 97.7 F (36.5 C) (Oral)  Ht 5\' 1"  (1.549 m)  Wt 133 lb (60.328 kg)  BMI 25.14 kg/m2 GEN: NAD CV: RRR, no m/r/g PULM: CTAB, no w/r/r, normal effort, Smells of tobacco smoke SKIN: Dry skin at ankles EXTR: No LE edema or calf tenderness HEENT: Multiple teeth missing     Assessment:     Brooke Rivera is a 52 y.o. female here for follow up of HTN, HLD, and Depression.    Plan:     # See  problem list for problem-specific plans. - Gave patient phone number to call for mammogram and colonoscopy - Declined flu shot - Recommended dental visit, last visit 1 year ago, reports brushing twice daily

## 2013-06-17 NOTE — Assessment & Plan Note (Addendum)
Moderately severe with PHQ-9 18 today with function "somewhat difficult". - Refilled med as had been off cymbalta for some time.  - Emphasized importance of taking daily and alerting provider PRIOR to medication running out - Return precautions reviewed

## 2013-06-17 NOTE — Assessment & Plan Note (Signed)
Well-controlled - Refilled HCTZ 12.5mg  daily - Encouraged medication compliance

## 2013-06-17 NOTE — Assessment & Plan Note (Signed)
Checked 01/2012 with chol 254 and LDL 182 - Refilled simvastatin 40mg  daily - Return fasting for lab visit for lipids in December, lab ordered - F/u with me afterwards

## 2013-08-04 ENCOUNTER — Other Ambulatory Visit: Payer: Medicare Other

## 2013-08-04 DIAGNOSIS — E785 Hyperlipidemia, unspecified: Secondary | ICD-10-CM

## 2013-08-04 LAB — LIPID PANEL
Cholesterol: 246 mg/dL — ABNORMAL HIGH (ref 0–200)
Total CHOL/HDL Ratio: 4 Ratio
VLDL: 24 mg/dL (ref 0–40)

## 2013-08-04 NOTE — Progress Notes (Signed)
FLP DONE TODAY Brooke Rivera 

## 2013-09-05 ENCOUNTER — Telehealth: Payer: Self-pay | Admitting: Family Medicine

## 2013-09-05 ENCOUNTER — Encounter: Payer: Self-pay | Admitting: Family Medicine

## 2013-09-05 NOTE — Telephone Encounter (Signed)
Error

## 2013-10-07 ENCOUNTER — Ambulatory Visit: Payer: Medicare Other | Admitting: Family Medicine

## 2013-11-25 ENCOUNTER — Telehealth: Payer: Self-pay | Admitting: Family Medicine

## 2013-11-25 NOTE — Telephone Encounter (Signed)
Pt ran out of her cholesterol med and blood pressure med about a month ago. She has been out of town. She has an appt 12-12-13 Can she get some pills to last her until then? Please advise

## 2013-12-16 ENCOUNTER — Ambulatory Visit: Payer: Medicare Other | Admitting: Family Medicine

## 2014-01-01 ENCOUNTER — Ambulatory Visit (INDEPENDENT_AMBULATORY_CARE_PROVIDER_SITE_OTHER): Payer: Medicare Other | Admitting: Family Medicine

## 2014-01-01 ENCOUNTER — Encounter: Payer: Self-pay | Admitting: Family Medicine

## 2014-01-01 VITALS — BP 124/77 | HR 97 | Temp 98.3°F | Wt 126.0 lb

## 2014-01-01 DIAGNOSIS — F329 Major depressive disorder, single episode, unspecified: Secondary | ICD-10-CM

## 2014-01-01 DIAGNOSIS — F3289 Other specified depressive episodes: Secondary | ICD-10-CM

## 2014-01-01 DIAGNOSIS — E785 Hyperlipidemia, unspecified: Secondary | ICD-10-CM

## 2014-01-01 DIAGNOSIS — H53419 Scotoma involving central area, unspecified eye: Secondary | ICD-10-CM

## 2014-01-01 DIAGNOSIS — H53459 Other localized visual field defect, unspecified eye: Secondary | ICD-10-CM

## 2014-01-01 DIAGNOSIS — F17200 Nicotine dependence, unspecified, uncomplicated: Secondary | ICD-10-CM

## 2014-01-01 DIAGNOSIS — I10 Essential (primary) hypertension: Secondary | ICD-10-CM

## 2014-01-01 HISTORY — DX: Scotoma involving central area, unspecified eye: H53.419

## 2014-01-01 MED ORDER — SIMVASTATIN 40 MG PO TABS: 40.0000 mg | ORAL_TABLET | Freq: Every day | ORAL | Status: DC

## 2014-01-01 MED ORDER — DULOXETINE HCL 30 MG PO CPEP: 30.0000 mg | ORAL_CAPSULE | Freq: Every day | ORAL | Status: DC

## 2014-01-01 NOTE — Patient Instructions (Signed)
Good to see you.  - Take your medications daily and let me know 2 weeks prior to running out. I have prescribed your cymbalta and zocor for 6 months.  - F/u with me in 2 months to follow up on your BP and cholesterol.  - If you develop worsening visual spots, headache, or BP outside of clinic that is >130/90, let me know and we will consider restarting HCTZ.   - Continue thinking about mammogram and colonoscopy.  - Also, most importantly, let me know if you are interested in our support with quitting smoking. It is the best thing you can do for your health!  Best,  Hilton Sinclair, MD

## 2014-01-01 NOTE — Progress Notes (Signed)
Patient ID: Brooke Rivera, female   DOB: 1960/09/14, 53 y.o.   MRN: [id number] Subjective:   CC: Folow up HTN and cholesterol  HPI:   HTN  Patient ran out of HCTZ 1 month ago. BP has been normal last multiple visits. She denies headaches, chest pain, dyspnea, or leg swelling. She also denies dizziness or syncope. She reports seeing little dots starting 2 days ago "every now and then" not lasting long. She does not check BP at home.  Cholesterol Pt was previously on mod intensity statin but has stopped taking it about 1 month ago because she ran out of medication.  Depression Pt takes cymbalta 30mg  daily and thinks it works well to control her depression. Denies SI/HI or depressive symptoms. Needs refill today.   Review of Systems - Per HPI.   Meds: reviewed No PMH changes Lives alone in [place].  Disability for learning disability Does not drive, catches bus to visits Smoking status: Smoker, 4 cig/day. 5/10 interest because tried before and failed.    Objective:  Physical Exam BP 124/77  Pulse 97  Temp(Src) 98.3 F (36.8 C) (Oral)  Wt 126 lb (57.153 kg) GEN: NAD, pleasant HEENT: Atraumatic, normocephalic, neck supple, EOMI, sclera clear, PERRL, o/p clear with poor dentition (a few missing teeth)  CV: RRR, no murmurs, rubs, or gallops, 1+ bilateral DP pulses PULM: CTAB, normal effort ABD: Soft, nontender, nondistended, NABS, no organomegaly SKIN: No rash or cyanosis; warm and well-perfused EXTR: No lower extremity edema or calf tenderness PSYCH: Mood and affect euthymic, normal rate and volume of speech NEURO: Awake, alert, no focal deficits grossly, normal speech and gait  Assessment:     Brooke Rivera is a 53 y.o. female here for f/u.    Plan:     HTN BP normal last multiple visits. No symptoms other than occasional scotomata (see below). - No meds for now. - Smoking cessation discussed. - Check BP at drug store; if elevated, call clinic and we can consider  restarting HCTZ. - F/u 2 mo.  Scotomata No obvious eye abnormalities with PERRL and EOMI. Scotomata are intermittent and do not last long.  - If visual changes occur/continue, seek immediate care.  - At f/u, consider eval by ophtho if still occurring.  Cholesterol Previous lipids drawn 07/2013 with risk 8.7%. - Restart moderate intensity statin simvastatin 40mg  qhs. - Recheck lipids 02/2014. Discussed calling ahead of time if medication nearly out.  Depressive disorder NOS Well-controlled on cymbalta 30mg  daily. No SI/HI. - Continue and refilled today for 6 month supply.  Tobacco abuse Continued smoking. Precontemplative. Has tried and failed in the past and is not motivated because feels this would happen again. - Suggested our support and resources and for pt to contact us when interested. - Emphasized health benefits.  # Health Maintenance: Due for Pap smear Nov 2015. - Colonoscopy: Pt has info at home but has not called yet. - Mammogram: Pt has info at home but has not called yet. - Flu shot October.  Follow-up: Follow up in 2 months for chol and HTN check.   Hilton Sinclair, MD [place]

## 2014-01-01 NOTE — Assessment & Plan Note (Signed)
Previous lipids drawn 07/2013 with risk 8.7%. - Restart moderate intensity statin simvastatin 40mg  qhs. - Recheck lipids 02/2014. Discussed calling ahead of time if medication nearly out.

## 2014-01-01 NOTE — Assessment & Plan Note (Signed)
Continued smoking. Precontemplative. Has tried and failed in the past and is not motivated because feels this would happen again. - Suggested our support and resources and for pt to contact us when interested. - Emphasized health benefits.

## 2014-01-01 NOTE — Assessment & Plan Note (Signed)
No obvious eye abnormalities with PERRL and EOMI. Scotomata are intermittent and do not last long.  - If visual changes occur/continue, seek immediate care.  - At f/u, consider eval by ophtho if still occurring.

## 2014-01-01 NOTE — Assessment & Plan Note (Signed)
BP normal last multiple visits. No symptoms other than occasional scotomata (see below). - No meds for now. - Smoking cessation discussed. - Check BP at drug store; if elevated, call clinic and we can consider restarting HCTZ. - F/u 2 mo.

## 2014-01-01 NOTE — Assessment & Plan Note (Signed)
Well-controlled on cymbalta 30mg  daily. No SI/HI. - Continue and refilled today for 6 month supply.

## 2014-06-29 ENCOUNTER — Telehealth: Payer: Self-pay | Admitting: Family Medicine

## 2014-06-29 NOTE — Telephone Encounter (Signed)
Pt called and would like to give her permission for Dr. Dianah Field to call and speak to Denyse Amass at [phone] concerning her medical issues. She is seeking help with her anxiety, and mental health issues. She was turned down by some outside sources and Denyse Amass who is a casework with a rehab ffice is trying to get her qualified again but needs some information/ I did speak to patient and she did giver her permission for the doctor to speak to [place].

## 2014-06-30 NOTE — Telephone Encounter (Signed)
Same response as below. Fleeger, Salome Spotted

## 2014-06-30 NOTE — Telephone Encounter (Signed)
Called listed number and phone just kept ringing and then went to a busy signal. Please call and find out what information Denyse Amass needs from me and I will be happy to help however I can.  Hilton Sinclair, MD

## 2014-09-30 ENCOUNTER — Ambulatory Visit: Payer: Medicare Other | Admitting: Family Medicine

## 2014-11-02 ENCOUNTER — Ambulatory Visit (INDEPENDENT_AMBULATORY_CARE_PROVIDER_SITE_OTHER): Payer: Medicare Other | Admitting: Family Medicine

## 2014-11-02 ENCOUNTER — Encounter: Payer: Self-pay | Admitting: Family Medicine

## 2014-11-02 VITALS — BP 120/77 | HR 92 | Temp 97.9°F | Ht 61.0 in | Wt 123.0 lb

## 2014-11-02 DIAGNOSIS — R221 Localized swelling, mass and lump, neck: Secondary | ICD-10-CM | POA: Diagnosis not present

## 2014-11-02 DIAGNOSIS — E039 Hypothyroidism, unspecified: Secondary | ICD-10-CM | POA: Insufficient documentation

## 2014-11-02 DIAGNOSIS — E079 Disorder of thyroid, unspecified: Secondary | ICD-10-CM | POA: Diagnosis not present

## 2014-11-02 NOTE — Patient Instructions (Signed)
Good to see you.  We are doing a thyroid ultrasound and checking some labwork. I will call with results of these to figure out the next steps.  Best,  Hilton Sinclair, MD

## 2014-11-02 NOTE — Assessment & Plan Note (Signed)
Concerning for thyromegaly given distribution and consistency. More prominent on the right side. No obvious palpable nodule. Reports symptoms of night sweats for about 10-15 years and weight fluctuations 3-4 years. Some obvious concern for malignancy here. Also consider other causes of goiter (thyroiditis, thyroid cysts, thyroid adenoma). No obstructive symptoms. -TSH, free T3, free T4 -Thyroid ultrasound ordered and scheduled. Pending these results, will proceed with TPO abs, thyroid uptake scan, or FNA bx. -Precepted with Dr. Nori Riis.

## 2014-11-02 NOTE — Progress Notes (Signed)
Patient ID: Brooke Rivera, female   DOB: 05-Nov-1960, 54 y.o.   MRN: [id number] Subjective:   CC: Neck swelling   HPI:   Patient presents to same-day clinic with 2 weeks of painless right-sided neck swelling. She describes noticing it randomly while showering 2 weeks ago, thinking it was larger than normal. She reports that is easily mobile. She denies any redness, warmth, purulence, or other symptoms like hoarseness or neck stiffness. She denies any preceding illness/URI. She does have an occasional cough which is normal for her and coughs up thick white mucus. She denies any neck range of motion issues. She does report intermittent weight fluctuation over the past 3-4 years with her baseline being 135 pounds 3-4 years ago but currently 105 to 115 pounds She also reports sweating and hot flashes that have been present since her 40s. She denies vaginal bleeding, diarrhea, constipation.   Review of Systems - Per HPI. PMH - HLD, tobacco use, depressive disorder, HTN, GERD Smoking status: Current every day smoker    Objective:  Physical Exam BP 120/77 mmHg  Pulse 92  Temp(Src) 97.9 F (36.6 C) (Oral)  Ht 5\' 1"  (1.549 m)  Wt 123 lb (55.792 kg)  BMI 23.25 kg/m2 GEN: NAD HEENT: Thyromegaly about 2 x 5 cm more prominent on the right side, nontender, easily mobile; no obvious palpable nodule; no hoarseness      Assessment:     Brooke Rivera is a 54 y.o. female here for evaluation of neck swelling .    Plan:     # See problem list and after visit summary for problem-specific plans.   Follow-up: Follow up - will call patient with results.   Hilton Sinclair, MD [place]

## 2014-11-03 ENCOUNTER — Other Ambulatory Visit: Payer: Medicare Other

## 2014-11-03 ENCOUNTER — Ambulatory Visit
Admission: RE | Admit: 2014-11-03 | Discharge: 2014-11-03 | Disposition: A | Discharge status: Home / Self Care | Payer: Medicare Other | Source: Ambulatory Visit | Attending: Family Medicine | Admitting: Family Medicine

## 2014-11-03 DIAGNOSIS — E049 Nontoxic goiter, unspecified: Secondary | ICD-10-CM | POA: Diagnosis not present

## 2014-11-03 DIAGNOSIS — E079 Disorder of thyroid, unspecified: Secondary | ICD-10-CM | POA: Diagnosis not present

## 2014-11-03 LAB — T4, FREE: Free T4: 0.96 ng/dL (ref 0.80–1.80)

## 2014-11-03 LAB — T3, FREE: T3 FREE: 2.8 pg/mL (ref 2.3–4.2)

## 2014-11-03 LAB — TSH: TSH: 4.463 u[IU]/mL (ref 0.350–4.500)

## 2014-11-03 NOTE — Progress Notes (Signed)
[facility] phlebotomist drew:  TSH, F-T3, F-T4

## 2014-11-09 ENCOUNTER — Telehealth: Payer: Self-pay | Admitting: Family Medicine

## 2014-11-09 NOTE — Telephone Encounter (Signed)
Called to discuss thyroid studies with patient. Let her know US showed diffusely enlarged thyroid gland (heterogeneous) with no obvious nodule. TSH high normal and free T3 and free T4 low normal. Pt having some sweating more t han normal x 2 weeks and feels constipated. Otherwise, only symptom is neck swelling that she reported. Let her know to f/u in 2 weeks for repeat TSH, free T3 and free T4, which we could follow with TPO abs if abnormal or symptomatic. Return sooner if worsened symptoms or immediately if any dyspnea or hoarseness suggesting growth. Pt voiced understanding.  Hilton Sinclair, MD

## 2014-12-08 ENCOUNTER — Ambulatory Visit (INDEPENDENT_AMBULATORY_CARE_PROVIDER_SITE_OTHER): Payer: Medicare Other | Admitting: Family Medicine

## 2014-12-08 ENCOUNTER — Encounter: Payer: Self-pay | Admitting: Family Medicine

## 2014-12-08 ENCOUNTER — Other Ambulatory Visit (HOSPITAL_COMMUNITY)
Admission: RE | Admit: 2014-12-08 | Discharge: 2014-12-08 | Disposition: A | Discharge status: Home / Self Care | Payer: Medicare Other | Source: Ambulatory Visit | Attending: Family Medicine | Admitting: Family Medicine

## 2014-12-08 VITALS — BP 125/86 | HR 61 | Temp 98.0°F | Ht 61.0 in | Wt 126.8 lb

## 2014-12-08 DIAGNOSIS — Z8659 Personal history of other mental and behavioral disorders: Secondary | ICD-10-CM

## 2014-12-08 DIAGNOSIS — Z124 Encounter for screening for malignant neoplasm of cervix: Secondary | ICD-10-CM | POA: Diagnosis not present

## 2014-12-08 DIAGNOSIS — Z202 Contact with and (suspected) exposure to infections with a predominantly sexual mode of transmission: Secondary | ICD-10-CM | POA: Diagnosis not present

## 2014-12-08 DIAGNOSIS — E010 Iodine-deficiency related diffuse (endemic) goiter: Secondary | ICD-10-CM

## 2014-12-08 DIAGNOSIS — N898 Other specified noninflammatory disorders of vagina: Secondary | ICD-10-CM | POA: Diagnosis not present

## 2014-12-08 DIAGNOSIS — F32A Depression, unspecified: Secondary | ICD-10-CM

## 2014-12-08 DIAGNOSIS — Z1151 Encounter for screening for human papillomavirus (HPV): Secondary | ICD-10-CM | POA: Diagnosis not present

## 2014-12-08 DIAGNOSIS — Z113 Encounter for screening for infections with a predominantly sexual mode of transmission: Secondary | ICD-10-CM | POA: Diagnosis not present

## 2014-12-08 DIAGNOSIS — R634 Abnormal weight loss: Secondary | ICD-10-CM | POA: Diagnosis not present

## 2014-12-08 DIAGNOSIS — E049 Nontoxic goiter, unspecified: Secondary | ICD-10-CM

## 2014-12-08 DIAGNOSIS — Z20828 Contact with and (suspected) exposure to other viral communicable diseases: Secondary | ICD-10-CM | POA: Diagnosis not present

## 2014-12-08 DIAGNOSIS — Z0189 Encounter for other specified special examinations: Secondary | ICD-10-CM

## 2014-12-08 DIAGNOSIS — F329 Major depressive disorder, single episode, unspecified: Secondary | ICD-10-CM

## 2014-12-08 DIAGNOSIS — R309 Painful micturition, unspecified: Secondary | ICD-10-CM | POA: Diagnosis not present

## 2014-12-08 DIAGNOSIS — Z0000 Encounter for general adult medical examination without abnormal findings: Secondary | ICD-10-CM

## 2014-12-08 LAB — POCT URINALYSIS DIPSTICK
BILIRUBIN UA: NEGATIVE
GLUCOSE UA: NEGATIVE
Ketones, UA: NEGATIVE
NITRITE UA: NEGATIVE
PH UA: 5.5
Protein, UA: 30
Spec Grav, UA: 1.025
Urobilinogen, UA: 0.2

## 2014-12-08 LAB — POCT UA - MICROSCOPIC ONLY: WBC, Ur, HPF, POC: >20

## 2014-12-08 LAB — T3, FREE: T3, Free: 2.7 pg/mL (ref 2.3–4.2)

## 2014-12-08 LAB — TSH: TSH: 15.642 u[IU]/mL — ABNORMAL HIGH (ref 0.350–4.500)

## 2014-12-08 LAB — T4, FREE: FREE T4: 0.67 ng/dL — AB (ref 0.80–1.80)

## 2014-12-08 MED ORDER — FLUCONAZOLE 150 MG PO TABS: 150.0000 mg | ORAL_TABLET | Freq: Once | ORAL | Status: DC

## 2014-12-08 MED ORDER — DULOXETINE HCL 30 MG PO CPEP: 30.0000 mg | ORAL_CAPSULE | Freq: Every day | ORAL | Status: DC

## 2014-12-08 MED ORDER — METRONIDAZOLE 500 MG PO TABS: 2000.0000 mg | ORAL_TABLET | Freq: Once | ORAL | Status: DC

## 2014-12-08 NOTE — Assessment & Plan Note (Signed)
PHQ9 score 15 - moderately severe depression. No SI or HI. Previously well-controlled on cymbalta which she has not been taking for some time due to running out. She would like to restart this. -Cymbalta reordered. -[email or web address] and Dr. Tod Persia card provided to patient and urged to make appointment for therapy. -Home health RN ordered per patient request due to inability to read and concern that she will not manage her medications appropriately -Follow-up thyroid studies reordered today. -Follow-up 1-2 weeks with me.
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
## 2014-12-08 NOTE — Patient Instructions (Signed)
It was good to see you today.  We are doing thyroid lab studies today and I will call you with the results. Seek immediate care if any shortness of breath. Otherwise follow-up with me in 1-2 weeks.  For sadness that you've been feeling, this is most likely related to depression. Make sure you're taking Cymbalta daily. Look on [email or web address] to find a therapist or consider meeting with Dr. Gwenlyn Saran here. I will give you her business card.  For your vaginal discharge, you have trichomonas in your urine sample. You also had yeast. I have sent in prescriptions for both of these. Make sure you take them. You should not take alcohol with these medications. We are also doing other tests and I will call if there is anything abnormal with them.  Be sure to follow up with me for a visit dedicated to a well woman visit. We had many other issues to talk about today and were unable to do this.  Best!  Hilton Sinclair, MD

## 2014-12-08 NOTE — Assessment & Plan Note (Addendum)
Mild hyperthyroid symptoms and mild increase in size of thyroid enlargement, still no palpable nodule. Normal vital signs. No shortness of breath. -Given borderline results of thyroid studies, will repeat today and check TPO-ABS given reported symptoms  -reasons for immediate evaluation including difficulty breathing or rapidly enlarging mass discussed. -Otherwise, follow up in 1 week. If mass effect, would consider thyroidectomy; if thyroid function abnormality, would consider referral to endocrinology.

## 2014-12-08 NOTE — Assessment & Plan Note (Signed)
Vaginal discharge and itching with yeast and Trichomonas seen on urinalysis. Many squamous cells so unable to evaluate UTI, but no dysuria or back pain. No fevers, chills, or pelvic pain. -GC, chlamydia, HIV, and RPR tested today. -Metronidazole prescribed to treat Trichomonas, Diflucan prescribed for yeast. -As patient cannot read, although printed instructions, also described instructions at length including that she would need to pick up and take these medications. Discussed avoiding alcohol. -F/u 1 week if no improvement. At that time, could trial repeat UA.

## 2014-12-09 ENCOUNTER — Telehealth: Payer: Self-pay | Admitting: *Deleted

## 2014-12-09 LAB — RPR

## 2014-12-09 LAB — GC/CHLAMYDIA PROBE AMP (~~LOC~~) NOT AT ARMC
CHLAMYDIA, DNA PROBE: NEGATIVE
Neisseria Gonorrhea: NEGATIVE

## 2014-12-09 LAB — HIV ANTIBODY (ROUTINE TESTING W REFLEX): HIV 1&2 Ab, 4th Generation: NONREACTIVE

## 2014-12-09 LAB — CYTOLOGY - PAP

## 2014-12-09 LAB — THYROID PEROXIDASE ANTIBODY: Thyroperoxidase Ab SerPl-aCnc: >900 IU/mL — ABNORMAL HIGH (ref <9)

## 2014-12-09 NOTE — Telephone Encounter (Signed)
Covering for Dr. Dianah Field, today. Called Jayda back to discuss; based on chart review, it appears Dr. Darene Lamer will continue to follow pt with home health services / provide orders. Orders placed by Dr. Darene Lamer (resident) under attending Dr. Lindell Noe based on [facility]; confirmed this with Jayda. Will forward message / number to Dr. Darene Lamer so she can call Alvis Lemmings back if there are any issues. --CMS

## 2014-12-09 NOTE — Telephone Encounter (Signed)
Jayda from [place] called to verify if Dianah Field will be provider to follow patient for home health services and providing orders. Please give her a call at [phone]. Derl Barrow, RN

## 2014-12-10 ENCOUNTER — Telehealth: Payer: Self-pay | Admitting: *Deleted

## 2014-12-10 ENCOUNTER — Other Ambulatory Visit: Payer: Self-pay

## 2014-12-10 DIAGNOSIS — R309 Painful micturition, unspecified: Secondary | ICD-10-CM | POA: Diagnosis not present

## 2014-12-10 DIAGNOSIS — R221 Localized swelling, mass and lump, neck: Secondary | ICD-10-CM | POA: Diagnosis not present

## 2014-12-10 DIAGNOSIS — N898 Other specified noninflammatory disorders of vagina: Secondary | ICD-10-CM

## 2014-12-10 MED ORDER — METRONIDAZOLE 500 MG PO TABS: 2000.0000 mg | ORAL_TABLET | Freq: Once | ORAL | Status: DC

## 2014-12-10 NOTE — Telephone Encounter (Signed)
Received call from [facility] pt was unaware that she was to take the metronidazole ALL four pills at one time, she has only been taking one a day.  Nira Conn wants to know if MD wants her to take the other two pills now or if a new rx needs to be sent in.  MD paged x 1. Jilleen Essner, Salome Spotted

## 2014-12-10 NOTE — Telephone Encounter (Signed)
MD is not available at this time. Precepted with Dr. Erin Hearing because of DX indication for metronidazole, he will call in 2 additional pills and pt should take those 2 and new rx @ the same time.  Heather @ [place] informed and she is at pts house now and will relay message. Jeannie Mallinger, Salome Spotted

## 2014-12-10 NOTE — Telephone Encounter (Signed)
Received a voice message from Mikle Bosworth stating pt is not able to pick up the two additional pills of metronidazole.  Nira Conn is also requesting verbal order for medical social worker for transportation and Chiropractor, speech therapy for swallowing assessment due to swelling of neck and occupational therapy for ADL safety.  There is a Education officer, museum trying to help patient get the two extra metronidazole pills.  Please give her a call at [phone].  Requesting a call back today.  Derl Barrow, RN

## 2014-12-10 NOTE — Patient Outreach (Signed)
[facility]) Care Management  12/10/2014  Brooke Rivera 17-Jun-1961 [id number]    Referral to [facility] for Community Case Management.   This RNCM was successful in making contact with patient via telephone, however, patient states she was busy with Nurse from [place].  However, upon further assessment, patient's needs would be better met by telephonic Disease Management.  Patient's case was assigned to Mariann Laster, RN to follow.

## 2014-12-10 NOTE — Telephone Encounter (Signed)
Rec take 4 tabs all at once.  Will send in 2 more tabs LC

## 2014-12-10 NOTE — Telephone Encounter (Signed)
Covering for Dr. Dianah Field. Reviewed pt's chart and called Heather back. Pt is unable to get the two additional metronidazole pills but has 2 left due to cost. Provided instructions to take the remaining two pills today (to give her a total of 3 taken today) and to follow up as needed if she has any further vaginitis-related issues. Provided verbal orders for Home Health as requested in [facility]. [place]'s note, above; per Nira Conn, this is in part to get case management assistance for financial help for medications, including Cymbalta. Will route this message to Dr. Shary Key, so she will be aware and can look out for any faxed home health orders to be signed. --CMS

## 2014-12-12 ENCOUNTER — Telehealth: Payer: Self-pay | Admitting: Family Medicine

## 2014-12-12 DIAGNOSIS — E039 Hypothyroidism, unspecified: Secondary | ICD-10-CM

## 2014-12-12 NOTE — Telephone Encounter (Addendum)
Called to discuss with patient:  TSH now high (15.6 from 4.6 1 mo ago), free T4 low (0.67 from 0.96 1 mo ago), free T3 low normal at 2.7, TPO-Abs high(>900).  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 Thyroid US (obtained due to large asymmetric goiter): ~6.5x3 right lobe, ~5x3 left lobe, thin septa bilaterally, no nodularity seen, isthmus ~1cm length - no suspicious features. Precepted with Dr Erin Hearing. Will start 41mcg levothyroxine daily and monitor symptoms. Pt has appt 26th when we will follow up her symptoms. We can recheck TSH and free T4 in 4-6 weeks. Hold off on surgery/FNA for now. Discussed reasons for immediate eval (rapid growth, difficulty breathing).  HIV, RPR GC/Chlamydia all neg. Normal pap smear with neg cotesting (though pos for trichomonas, being treated).  Hilton Sinclair, MD

## 2014-12-12 NOTE — Telephone Encounter (Signed)
Thank you Dr Venetia Maxon! Hilton Sinclair, MD

## 2014-12-13 MED ORDER — LEVOTHYROXINE SODIUM 25 MCG PO TABS: 25.0000 ug | ORAL_TABLET | Freq: Every day | ORAL | Status: DC

## 2014-12-14 DIAGNOSIS — R221 Localized swelling, mass and lump, neck: Secondary | ICD-10-CM | POA: Diagnosis not present

## 2014-12-14 DIAGNOSIS — R309 Painful micturition, unspecified: Secondary | ICD-10-CM | POA: Diagnosis not present

## 2014-12-15 DIAGNOSIS — E780 Pure hypercholesterolemia: Secondary | ICD-10-CM | POA: Diagnosis not present

## 2014-12-15 DIAGNOSIS — E069 Thyroiditis, unspecified: Secondary | ICD-10-CM | POA: Diagnosis not present

## 2014-12-15 DIAGNOSIS — R221 Localized swelling, mass and lump, neck: Secondary | ICD-10-CM | POA: Diagnosis not present

## 2014-12-15 DIAGNOSIS — R309 Painful micturition, unspecified: Secondary | ICD-10-CM | POA: Diagnosis not present

## 2014-12-15 DIAGNOSIS — F329 Major depressive disorder, single episode, unspecified: Secondary | ICD-10-CM | POA: Diagnosis not present

## 2014-12-15 DIAGNOSIS — I10 Essential (primary) hypertension: Secondary | ICD-10-CM | POA: Diagnosis not present

## 2014-12-15 NOTE — Patient Outreach (Signed)
[facility]) Care Management  12/15/2014  Betsie Peckman May 30, 1961 [id number]    Referral received from Primary Care MD, Dr. Dwyane Dee. Issue:  Patient in Not eligible for [facility] Care Management services at this time.   Assess patient to connect to resources.  Patient needs help with medications as patient cannot read and has poor understanding of med needs.  Issue:  Inbound message from Fran Lowes, RN,  [facility] nurse requesting call back regarding: patient having difficulty with affording medications.   Chart Review: Primary MD:  Dr. Gwen Her. [facility]. Medicare:  Yes MCD:  yes  THN (Next Generation Case)  Outreach call to [place] Nurse, Fran Lowes, RN [place] states patient went to pharmacy and had a prescriptions ready for thyroid medication, Cymbalta and antibiotic but only picked up the antibiotic.   RN CM discussed issues and recommended the following assessment:  -Confirm patient is still active with Medicare and Medicaid.  If patient is 54yo and disabled; should still have Medicare option.  -Advised Medicaid co-pays should only be $3-4.00 and should be affordable for patient.   -Consult pharmacy for review of prescribed medications which patient is not picking up or taking compliantly.  -Check Medicaid 2016 drug formulary to review any medications on list that may not be covered and consult with MD for possible change to other covered medication.  -Search website:  needymeds.org for any possible medication assistance needs. -RN assess self-management process for home medication management and assist with weekly medication box during interim to allow RN time to assess patient's ability and self-management interventions.  Consider pharmacy bubble packs from pharmacy.   -Request order from Primary MD for home health order for medication management and assess compliance  with keeping all scheduled MD appt's.   -RN assess for Barrier to care:  Transportation - Is patient able to get to pharmacy to pick up medications?  Consider pharmacy with home delivery options.  Consider SW referral for Transportation needs.  Request SW order for primary MD  -SW assessment  for  other resources which may free up needed money to cover cost of medications.   -SW assessment for financial assessment.    -SW assessment and assistance with low income subsidy (LIS) application form to assist with medication needs.  -SW to assist patient with form completion due to BARRIER TO CARE:  ([place]) -Please add SW assessment for transportation needs if home health identifies transportation issues.   Other:  Depression:  MD ordered resumption of Cymbalta on 12/08/2014 per patient request due to medication previously ordered but patient had not been taking.   MD recommended:  Psychologytoday.com and Dr. Tod Persia card provided to patient and was urged to make appointment for therapy.  (Did patient make this appt? And keep appt?).    RN CM left voice mail message requesting call back from [place] to provide additional update following MR review.   RN CM followed up with [place] contact, Bethena Roys [phone] who will provide [place] update to Nurse [facility].  RN CM updated Primary MD of the above consult note and case management recommendations.  Request to MD to request updated home health order following MD review and orders for the following needs:   Please add new home health order as follows:  - RN to assess, monitor and teach medication management.  - RN to assess, monitor and teach weekly medication box.  - RN  to assess for other pharmacy interventions such as pharmacy dosing bubble packs  -RN to assess, monitor and teach MD appt compliance.    -SW Referral  -SW assessment for other resources which may free up needed money to cover cost of medications.   -SW  assessment for financial assessment   -SW assessment and assistance with low income subsidy (LIS) application form to assist with medication needs.   -SW to assist patient with form completion due to BARRIER TO CARE: ([place])  -Please add SW assessment for transportation needs if home health identifies transportation issues.   Other: Depression: MD ordered resumption of Cymbalta on 12/08/2014 per patient request due to medication previously ordered but patient had not been taking. MD recommended: Psychologytoday.com and Dr. Tod Persia card provided to patient and was urged to make appointment for therapy. (Did patient make this appt? And keep appt?).    Mariann Laster, RN, BSN, [facility], CCM  [employer] Management Coordinator [phone] Office [phone] Direct [phone] Cell

## 2014-12-16 ENCOUNTER — Telehealth: Payer: Self-pay

## 2014-12-16 ENCOUNTER — Telehealth: Payer: Self-pay | Admitting: Family Medicine

## 2014-12-16 DIAGNOSIS — R221 Localized swelling, mass and lump, neck: Secondary | ICD-10-CM | POA: Diagnosis not present

## 2014-12-16 DIAGNOSIS — R309 Painful micturition, unspecified: Secondary | ICD-10-CM | POA: Diagnosis not present

## 2014-12-16 NOTE — Progress Notes (Signed)
Patient ID: Brooke Rivera, female   DOB: [birth year], 54 y.o.   MRN: [id number]  Case management agency called and notified front office that they did a phone visit on this patient.  Hilton Sinclair, MD

## 2014-12-16 NOTE — Telephone Encounter (Signed)
Needs a script for a shower chair to an DME of choice; plan of care: Caprice Red with [facility] says his plan is twice a week for one week for  energy conservation and adaptable equipment training; no need to call back unless she disagrees; pt not interested in exercises in home making training / thanks Fonda Kinder, ASA

## 2014-12-16 NOTE — Progress Notes (Signed)
Patient ID: Brooke Rivera, female   DOB: Oct 23, 1960, 54 y.o.   MRN: [id number]   Thank you,  I approve of these orders. Can you place them under my name?   Thank you,  Hilton Sinclair, MD PGY-3, [place]

## 2014-12-16 NOTE — Patient Outreach (Signed)
[facility]) Care Management  12/16/2014  Crystalyn Delia 30-Nov-1960 [id number]   Primary Care MD (Correction) Hilton Sinclair, MD [place]  [place] [address] Phone: [phone] Fax: [phone]  RN CM contacted Primary MD to confirm active with patient - last appt 12/08/14 - next appt 12/21/14 RN CM updated office contact:  Livia Snellen to notify MD that [facility] CM consult completed and please review Epic note 12/15/2014.   RN CM instructed will also send in-basket note to MD with home health recommendations to provide further assistance with needs in this case.   RN CM notified Home Health provider: Nurse Mikle Bosworth of Primary MD update.   Mariann Laster, RN, BSN, [facility], CCM  [employer] Management Coordinator [phone] Office [phone] Direct [phone] Cell

## 2014-12-16 NOTE — Telephone Encounter (Signed)
LM for Caprice Red to call back.  Please inform of message below.  Aries Townley,CMA

## 2014-12-16 NOTE — Telephone Encounter (Signed)
I am unclear about the dx why she would need a shower chair. Please call Tommi Emery to find this out. Thanks! Hilton Sinclair, MD

## 2014-12-16 NOTE — Patient Outreach (Signed)
[place] [facility]) Care Management  12/16/2014  Brooke Rivera September 21, 1960 [id number]   Epic in-basket communication with Dr. Verdis Frederickson T. Dianah Field, [place] provider:  Alvis Lemmings updated per MD request.  MD will update Home Health order as follows:  Please add new home health order as follows:  - RN to assess, monitor and teach medication management.  - RN to assess, monitor and teach weekly medication box.  - RN to assess for other pharmacy interventions such as pharmacy dosing bubble packs  -RN to assess, monitor and teach MD appt compliance.    -SW Referral  -SW assessment for other resources which may free up needed money to cover cost of medications.   -SW assessment for financial assessment   -SW assessment and assistance with low income subsidy (LIS) application form to assist with medication needs.   -SW to assist patient with form completion due to BARRIER TO CARE: ([place])  -Please add SW assessment for transportation needs if home health identifies transportation issues.   Other: Depression: MD ordered resumption of Cymbalta on 12/08/2014 per patient request due to medication previously ordered but patient had not been taking. MD recommended: [email or web address] and Dr. Tod Persia card provided to patient and was urged to make appointment for therapy. (Did patient make this appt? And keep appt?).   RN CM faxed copies via EPIC of [facility] consult notes dated 12/15/2014 and 12/16/2014 to home health provider: [facility] [address]  [place], [place] [address] [phone] fax [phone] office  Mariann Laster, RN, BSN, [facility], [place] Management Care Management Coordinator [phone] Office [phone] Direct [phone] Cell

## 2014-12-17 ENCOUNTER — Telehealth: Payer: Self-pay | Admitting: Family Medicine

## 2014-12-17 ENCOUNTER — Telehealth: Payer: Self-pay

## 2014-12-17 NOTE — Telephone Encounter (Signed)
I don't understand - needs a verbal order that it is okay for OT to see her for her last visit? If so, that is fine with me. [place] Team please help me clarify.  Hilton Sinclair, MD

## 2014-12-17 NOTE — Patient Outreach (Signed)
[place] [facility]) Care Management  12/17/2014  Jenniah Bhavsar 1960-11-22 [id number]   Care Coordination Note:  Inbound (call return) from  Contact: Arbutus Leas with [facility]  Issue: Review and assess for eligibility for community services.   Joelene Millin confirms patient is eligible for services but currently has MCD card activated through [facility] with practice name [facility].  States unable to confirm if patient has been seen by that practice.  States patient needs to contact her DSS case worker to change the following on her card:   Provider name:  Dr. Dalbert Mayotte;  [place]:  Mikel Cella to [place] and Practice name change: [facility] to [facility].  Joelene Millin will contact [place] to determine activity and determine if patient can be followed until changes are made and by which county.  Joelene Millin will follow-up and contact RN CM with updates next week.   Mariann Laster, RN, BSN, [facility], CCM  [employer] [employer] Management Coordinator [phone] Office [phone] Direct [phone] Cell

## 2014-12-17 NOTE — Telephone Encounter (Signed)
Need verbal orders: pt declined OT today. Needs ok to see her next week for her last visit

## 2014-12-17 NOTE — Patient Outreach (Signed)
[place] [facility]) Care Management  12/17/2014  Shanesha Bednarz [birth year] [id number]   Care Coordination Note:  Outreach call  Contact:  Arbutus Leas with [facility]  Issue:  Review and  assess for eligibility for community services.   RN CM left voice mail message requesting call back to discuss case.   Mariann Laster, RN, BSN, [facility], CCM  [facility] [employer] Management Coordinator [phone] Office [phone] Direct [phone] Cell

## 2014-12-17 NOTE — Patient Outreach (Signed)
[facility]) Care Management  12/17/2014  Brooke Rivera [birth year] [id number]   Care Coordination Note:   Inbound call from [place], home health nurse [place].  States unable to except order for new home health services from Dr. Hilton Sinclair who is identified as a resident MD.  States her record indicates Primary MD as Dr. Dalbert Mayotte, [place].   RN CM advised Home Health Nurse to contact Dr. Lindell Noe for confirmation and to finalize order for [facility] services.   Mariann Laster, RN, BSN, [facility], CCM  [facility] [employer] Management Coordinator [phone] Office [phone] Direct [phone] Cell

## 2014-12-20 NOTE — Telephone Encounter (Signed)
LM for Brooke Rivera to call us back to verify.  Gave verbal ok for this week but still need clarification on this order. Sena Clouatre,CMA

## 2014-12-20 NOTE — Telephone Encounter (Signed)
Caprice Red is aware. Deepti Gunawan,CMA

## 2014-12-21 ENCOUNTER — Ambulatory Visit (INDEPENDENT_AMBULATORY_CARE_PROVIDER_SITE_OTHER): Payer: Medicare Other | Admitting: Family Medicine

## 2014-12-21 ENCOUNTER — Encounter: Payer: Self-pay | Admitting: Family Medicine

## 2014-12-21 VITALS — BP 132/82 | HR 78 | Temp 97.7°F | Ht 61.0 in | Wt 125.0 lb

## 2014-12-21 DIAGNOSIS — E049 Nontoxic goiter, unspecified: Secondary | ICD-10-CM

## 2014-12-21 DIAGNOSIS — E010 Iodine-deficiency related diffuse (endemic) goiter: Secondary | ICD-10-CM

## 2014-12-21 DIAGNOSIS — R221 Localized swelling, mass and lump, neck: Secondary | ICD-10-CM | POA: Diagnosis not present

## 2014-12-21 DIAGNOSIS — R309 Painful micturition, unspecified: Secondary | ICD-10-CM | POA: Diagnosis not present

## 2014-12-21 DIAGNOSIS — E039 Hypothyroidism, unspecified: Secondary | ICD-10-CM | POA: Diagnosis not present

## 2014-12-21 DIAGNOSIS — F329 Major depressive disorder, single episode, unspecified: Secondary | ICD-10-CM

## 2014-12-21 DIAGNOSIS — F32A Depression, unspecified: Secondary | ICD-10-CM

## 2014-12-21 DIAGNOSIS — Z8659 Personal history of other mental and behavioral disorders: Secondary | ICD-10-CM

## 2014-12-21 MED ORDER — LEVOTHYROXINE SODIUM 50 MCG PO TABS: 50.0000 ug | ORAL_TABLET | Freq: Every day | ORAL | Status: DC

## 2014-12-21 MED ORDER — DULOXETINE HCL 30 MG PO CPEP: 30.0000 mg | ORAL_CAPSULE | Freq: Every day | ORAL | Status: DC

## 2014-12-21 NOTE — Assessment & Plan Note (Signed)
Depressive symptoms mildly worsened, no SI or HI. -Represcribed Cymbalta as patient stated there is some issue with initial prescription. -Follow-up in 2 weeks.

## 2014-12-21 NOTE — Patient Outreach (Signed)
[facility]) Care Management  12/21/2014  Brooke Rivera 27-Oct-1960 [id number]   Care Coordination Note: Interdisciplinary Team Meeting at Primary MD office during patient MD appt at 10:00. Next Generation Case Referral Date:  12/10/2014 Triage Consult call with MD Dr. Conni Slipper on 12/16/2014    RN CM met with office SW, Hunt Oris and provided update on patient's current plan of care and MDs home health order update on 12/16/2014.  RN CM advised of update from home health nurse that identified barrier to care is lack of consistent and available caregiver in the home to provide assistance with reading medications to assist with medication adherence needs.  RN CM provided update on [facility] referral who confirmed they will be able to provide community resources and will arrange for nurse [place] to contact patient to schedule home visit.  RN CM requested that Hunt Oris, SW speak with MD and request additional order be sent to home health SW to follow-up on resources in the community to address adult literacy needs.  Constance Holster stated she would contact home health provider:  Alvis Lemmings with this request.   RN CM will transition / refer case to [place] Case Management services.  RN CM advised Constance Holster SW services provided by Alvis Lemmings at this time and [facility] SW is not active at this time to avoid duplication of services.   RN CM discussed Depression plan of care:  MDs recommendation to see Dr. Gwenlyn Saran.  Constance Holster confirms Dr. Gwenlyn Saran is the Psychologist at [facility] and will make other referral if Dr. Gwenlyn Saran is unable to see patient.   Resources at this time:   Home health services with Alvis Lemmings, Nurse Pati Gallo for [facility] pending with [place].  Referral to [place] Case Management services on 12/21/2014  RN CM notified Alvis Lemmings of the above updates via [employer].   Mariann Laster, RN, BSN, [facility], CCM  [facility] [employer] Management Coordinator [phone] Office [phone] Direct [phone] Cell

## 2014-12-21 NOTE — Progress Notes (Signed)
Patient ID: Brooke Rivera, female   DOB: 1960-09-06, 54 y.o.   MRN: [id number] Subjective:   CC: Follow-up  HPI:   Thyroid symptoms The thyroid symptoms about at the same, however thyroid feels increasingly enlarged. She has some increased change of her voice. She denies any airway compromise symptoms except she feels slightly odd when she lays flat. She just picked up the Synthroid 3 days ago and has been taking it for the past 2 days. Denies any adverse effects. Denies fevers, chills. Reports continued night sweats and weight fluctuation.  Depressive symptoms Patient was unable to pick up Cymbalta because she was told by the pharmacy that it was not ready although I prescribed at last visit. She reports depressive symptoms have mildly worsened. She denies SI or HI.  Review of Systems - Per HPI.  PMH: Depressive disorder, thyromegaly, learning disability, hypertension, hyperlipidemia, GERD    Objective:  Physical Exam BP 132/82 mmHg  Pulse 78  Temp(Src) 97.7 F (36.5 C) (Oral)  Ht 5\' 1"  (1.549 m)  Wt 125 lb (56.7 kg)  BMI 23.63 kg/m2  SpO2 98% GEN: NAD Cardiovascular: Regular rate and rhythm Abdomen: Soft, nontender HEENT: Atraumatic, normocephalic, thyroid enlarged 15-16 cm, grossly more enlarged on the right, firm throughout but no nodule still palpated, no tenderness or erythema or warmth. Voice slightly muffled.  Pulm; Normal effort, No stridor or respiratory distress.  Extremities: No lower extremity edema or calf tenderness Psych: Mood and affect mildly flattened, no SI/HI    Assessment:     Brooke Rivera is a 54 y.o. female here for f/u of thyroiditis and depressive symptoms.    Plan:     # See problem list and after visit summary for problem-specific plans. -Regarding home health, spoke with social worker normal. She states patient is likely able to qualify for help with literacy classes. She is helping arrange this through home health [place]. She also has  P4CC help and home health at this time.  # Health Maintenance: Needs f/u for well woman visit.  Follow-up: Follow up in 2 weeks again for f/u of thyroid and depressive symptoms.    Hilton Sinclair, MD [place]

## 2014-12-21 NOTE — Patient Instructions (Signed)
Start taking 50 mcg of the levothyroxine daily instead of 25. I have reordered for once you need a refill. Follow up with me in 2 weeks. Just before our visit (in about 1.5 weeks), get another thyroid ultrasound. If you have trouble breathing, seek immediate care. Take ibuprofen 4-600mg  three times daily with food for 1-2 weeks.  Best,  Hilton Sinclair, MD

## 2014-12-21 NOTE — Patient Outreach (Addendum)
[place] [facility]) Care Management  12/21/2014  Matisha Termine 09-Nov-1960 [id number]    Care Coordination:  Phone call to [place] [place] to confirm fax #.  [address]  [place], [place] [address] [phone] office [phone] fax   [facility] Contact note 12/21/2014 faxed to [facility], Nurse Nira Conn.  Referral sent to Lurline Del to assign [place] services   Mariann Laster, RN, BSN, [facility], [place] Management Care Management Coordinator [phone] Office [phone] Direct [phone] Cell

## 2014-12-21 NOTE — Assessment & Plan Note (Addendum)
Thyromegaly/diffuse goiter with no nodules palpated or seen on ultrasound. Thyroid studies consistent with Hashimoto's thyroiditis. Enlargement is becoming frustrating to patient, changing voice slightly, but has not caused any respiratory symptoms. AFVSS. -Patient has only been taking Synthroid 25 g for 2 days. Will increase to 50 g and urged to take daily. -Can trial NSAIDs x 2 weeks. -Follow-up in 2 weeks. Ordered another thyroid ultrasound to be performed just prior to this follow-up. At that time, decide if refer to endo or ENT surgery, though not at this time as hashimoto's basically untreated and no airway compromise with regular course of thyroiditis. -Return precautions for immediate evaluation including respiratory distress reviewed  -Precepted with Dr. Lindell Noe

## 2014-12-22 DIAGNOSIS — R309 Painful micturition, unspecified: Secondary | ICD-10-CM | POA: Diagnosis not present

## 2014-12-22 DIAGNOSIS — R221 Localized swelling, mass and lump, neck: Secondary | ICD-10-CM | POA: Diagnosis not present

## 2014-12-23 ENCOUNTER — Other Ambulatory Visit: Payer: Medicare Other

## 2014-12-23 NOTE — Patient Outreach (Signed)
Initital telephone contact for initial community case management assessment.  Patient readily participated in assessment by answering questions and responding to assessments. Patient states she is currently in a Psycoschol Rehabilitation Class with [place].  Patient and this RNCM agreed to home visit on Tuesday, Dec 28, 2014 at 2pm for completion of initial assessment, care planning.

## 2014-12-27 ENCOUNTER — Other Ambulatory Visit (HOSPITAL_COMMUNITY): Payer: Medicare Other

## 2014-12-28 ENCOUNTER — Ambulatory Visit: Payer: Self-pay

## 2014-12-28 ENCOUNTER — Other Ambulatory Visit: Payer: Self-pay

## 2014-12-28 NOTE — Patient Outreach (Signed)
[facility]) Care Management  12/28/2014  Claudette Wermuth [birth year] [id number]   This RNCM arrived at patient's home for scheduled home visit. Patient did not answer the door.  Call was placed telephone number of record.   Patient answered, identified herself by giving date of birth, address.  Patient then stated she forgot about the scheduled home visit for today.  She stated she was not at home, was out paying bills. Patient and this RNCM agreed to talk later in the week to schedule home visit.

## 2014-12-30 ENCOUNTER — Other Ambulatory Visit: Payer: Self-pay

## 2014-12-30 ENCOUNTER — Ambulatory Visit: Payer: Medicare Other

## 2014-12-30 DIAGNOSIS — R309 Painful micturition, unspecified: Secondary | ICD-10-CM | POA: Diagnosis not present

## 2014-12-30 DIAGNOSIS — R221 Localized swelling, mass and lump, neck: Secondary | ICD-10-CM | POA: Diagnosis not present

## 2014-12-30 NOTE — Patient Outreach (Signed)
Unsuccessful attempt made to contact patient to schedule home visit originally scheduled for 12/28/2014, however, patient was unavailable.  Outgoing message indicated patient was not accepting calls at this time.  Will attempt to contact patient on Monday, May 9th.

## 2014-12-31 ENCOUNTER — Ambulatory Visit (HOSPITAL_COMMUNITY)
Admission: RE | Admit: 2014-12-31 | Discharge: 2014-12-31 | Disposition: A | Discharge status: Home / Self Care | Payer: Medicare Other | Source: Ambulatory Visit | Attending: Family Medicine | Admitting: Family Medicine

## 2014-12-31 DIAGNOSIS — E010 Iodine-deficiency related diffuse (endemic) goiter: Secondary | ICD-10-CM | POA: Insufficient documentation

## 2014-12-31 DIAGNOSIS — E049 Nontoxic goiter, unspecified: Secondary | ICD-10-CM

## 2014-12-31 DIAGNOSIS — E039 Hypothyroidism, unspecified: Secondary | ICD-10-CM | POA: Diagnosis not present

## 2014-12-31 DIAGNOSIS — E048 Other specified nontoxic goiter: Secondary | ICD-10-CM | POA: Diagnosis present

## 2015-01-02 ENCOUNTER — Telehealth: Payer: Self-pay | Admitting: Family Medicine

## 2015-01-02 NOTE — Telephone Encounter (Signed)
Called to discuss thyroid US which showed stable thyromegaly, diffuse heterogeneity of echotexture slightly more prominent reflecting likely evolving chronic thyroiditis. She was unavailable and could not leave VM. Will discuss at her f/u appt with me scheduled 5/10.  Hilton Sinclair, MD

## 2015-01-03 ENCOUNTER — Other Ambulatory Visit: Payer: Self-pay

## 2015-01-03 NOTE — Patient Outreach (Signed)
Unsuccessful attempt x2 made to contact patient to schedule home visit as patient was not available during time of previously scheduled home visit. HIPPA compliant message left for patient with this RNCM's contact information. If no return call, this RNCM will make another attempt to contact patient on Friday, Jan 07, 2015

## 2015-01-04 ENCOUNTER — Ambulatory Visit: Payer: Medicare Other | Admitting: Family Medicine

## 2015-01-06 ENCOUNTER — Other Ambulatory Visit: Payer: Medicare Other

## 2015-01-07 ENCOUNTER — Other Ambulatory Visit: Payer: Self-pay

## 2015-01-07 NOTE — Patient Outreach (Signed)
[facility] home visit scheduled with patient for Jan 13, 2015 at 1230 pm to discuss

## 2015-01-12 NOTE — Progress Notes (Signed)
I was preceptor for this visit.  Tiffany Talarico, MD 

## 2015-01-13 ENCOUNTER — Other Ambulatory Visit: Payer: Self-pay

## 2015-01-13 DIAGNOSIS — I10 Essential (primary) hypertension: Secondary | ICD-10-CM

## 2015-01-13 NOTE — Patient Outreach (Signed)
[facility]) Care Management  01/13/2015  Brooke Rivera 03/14/1961 [id number]   This RNCM completed initial home visit to assess community case management needs. Patient states she has been without her medications since our last conversation because she cannot afford her medication co-pays.  Patient states her disability card expired, card is used to cover the cost of medicaton co-pays.      Referrals made as a result of assessment during this home visit: In home aide service-patient is in need of custodial care assist Outpatient therapy counseling-patient is currently in a Psychosocial Rehabilitation Program, however, patient states she does not get individual sessions which she has currently requested.    Pharmacy consult-[facility] consult to assist with obtaining her medications.  Patient states she is out of medications and is out of medications.    Literacy program offered through [employer] is illiterate, is interested in learning to read and write.

## 2015-01-20 ENCOUNTER — Encounter: Payer: Self-pay | Admitting: Clinical

## 2015-01-20 ENCOUNTER — Other Ambulatory Visit: Payer: Self-pay | Admitting: Pharmacist

## 2015-01-20 DIAGNOSIS — F332 Major depressive disorder, recurrent severe without psychotic features: Secondary | ICD-10-CM | POA: Diagnosis not present

## 2015-01-20 NOTE — Progress Notes (Signed)
CSW has placed PCS form in PCP box as suggested by [facility], RN Loni Muse who is working with pt.  Hunt Oris, MSW, [place]

## 2015-01-20 NOTE — Patient Outreach (Signed)
[place]) Care Management  [place]   01/20/2015  Emmanuel Ercole 1960/10/06 [id number]  Subjective: Brooke Rivera is a 54 y.o. female who was referred to [place] for medication assistance. Patient reports to [place], that she has a difficult time affording her medications and that she is currently out of all of them. Patient has Medicaid.  I called patient today to follow up. She reports that she has no money right now because she lost her card. She had to borrow money from someone to pay her rent. She doesn't have the money for the $3 copays until she gets her card, which she thinks will be next week.   Objective:   Current Medications: Current Outpatient Prescriptions  Medication Sig Dispense Refill  . desloratadine (CLARINEX) 5 MG tablet Take 1 tablet (5 mg total) by mouth daily. (Patient not taking: Reported on 01/13/2015) 30 tablet 2  . DULoxetine (CYMBALTA) 30 MG capsule Take 1 capsule (30 mg total) by mouth daily. (Patient not taking: Reported on 01/13/2015) 90 capsule 1  . fluconazole (DIFLUCAN) 150 MG tablet Take 1 tablet (150 mg total) by mouth once. Repeat in 3 days if symptoms not gone (Patient not taking: Reported on 01/13/2015) 2 tablet 0  . hydrochlorothiazide (HYDRODIURIL) 12.5 MG tablet Take 1 tablet (12.5 mg total) by mouth daily. 30 tablet 5  . levothyroxine (SYNTHROID, LEVOTHROID) 50 MCG tablet Take 1 tablet (50 mcg total) by mouth daily before breakfast. (Patient not taking: Reported on 01/13/2015) 60 tablet 1  . metroNIDAZOLE (FLAGYL) 500 MG tablet Take 4 tablets (2,000 mg total) by mouth once. (Patient not taking: Reported on 01/13/2015) 2 tablet 0  . simvastatin (ZOCOR) 40 MG tablet Take 1 tablet (40 mg total) by mouth at bedtime. (Patient not taking: Reported on 01/13/2015) 90 tablet 1   No current facility-administered medications for this visit.    Functional Status: No flowsheet data  found.  Fall/Depression Screening: PHQ 2/9 Scores 01/13/2015 12/21/2014 12/08/2014 11/02/2014 06/15/2013 03/14/2013  PHQ - 2 Score 1 4 4 2 4 4   PHQ- 9 Score - 12 15 - 18 19    Assessment: 1. Medication assistance: patient can typically afford her medications but currently does not have her card to pay for the medications. She should have her card next week.  Plan: 1. Medication assistance: will discuss with Dr. Dianah Field as to whether we should wait until the patient has funds next week to get her medications or if we have any indigent funds available at [place] to assist patient with her medications. Will follow up with patient once a plan of action is decided upon.   Nicoletta Ba, PharmD, [place] [facility] Resident [phone]

## 2015-01-23 NOTE — Progress Notes (Signed)
Working with Nicoletta Ba to help patient obtain medications. She will be contacted early this coming week and if still no funds, we will check with Indigent Funds from our clinic to see if we can help her with 1 month of rx's.  Hilton Sinclair, MD

## 2015-01-25 ENCOUNTER — Other Ambulatory Visit: Payer: Self-pay | Admitting: Pharmacist

## 2015-01-25 ENCOUNTER — Telehealth: Payer: Self-pay | Admitting: Family Medicine

## 2015-01-25 DIAGNOSIS — Z599 Problem related to housing and economic circumstances, unspecified: Secondary | ICD-10-CM

## 2015-01-25 DIAGNOSIS — Z598 Other problems related to housing and economic circumstances: Secondary | ICD-10-CM

## 2015-01-25 DIAGNOSIS — F332 Major depressive disorder, recurrent severe without psychotic features: Secondary | ICD-10-CM | POA: Diagnosis not present

## 2015-01-25 DIAGNOSIS — F32A Depression, unspecified: Secondary | ICD-10-CM

## 2015-01-25 DIAGNOSIS — E038 Other specified hypothyroidism: Secondary | ICD-10-CM

## 2015-01-25 DIAGNOSIS — F329 Major depressive disorder, single episode, unspecified: Secondary | ICD-10-CM

## 2015-01-25 DIAGNOSIS — F819 Developmental disorder of scholastic skills, unspecified: Secondary | ICD-10-CM

## 2015-01-25 NOTE — Patient Outreach (Signed)
[place] [facility]) Care Management  Brooke Rivera   01/25/2015  Brooke Rivera August 10, 1961 [id number]  Subjective: Brooke Rivera is a 54 y.o. female who was referred to [place] for medication assistance. I have discussed the case with Dr. Dianah Field and we decided to use the clinic indigent funds to help the patient with her medications. However, I spoke to the patient today and she reports that she has no transportation to come to [place] or to go to the pharmacy. She reports that her daughter was going to help her go to the pharmacy later this week.   Objective:   Current Medications: Current Outpatient Prescriptions  Medication Sig Dispense Refill  . desloratadine (CLARINEX) 5 MG tablet Take 1 tablet (5 mg total) by mouth daily. (Patient not taking: Reported on 01/13/2015) 30 tablet 2  . DULoxetine (CYMBALTA) 30 MG capsule Take 1 capsule (30 mg total) by mouth daily. (Patient not taking: Reported on 01/13/2015) 90 capsule 1  . fluconazole (DIFLUCAN) 150 MG tablet Take 1 tablet (150 mg total) by mouth once. Repeat in 3 days if symptoms not gone (Patient not taking: Reported on 01/13/2015) 2 tablet 0  . hydrochlorothiazide (HYDRODIURIL) 12.5 MG tablet Take 1 tablet (12.5 mg total) by mouth daily. 30 tablet 5  . levothyroxine (SYNTHROID, LEVOTHROID) 50 MCG tablet Take 1 tablet (50 mcg total) by mouth daily before breakfast. (Patient not taking: Reported on 01/13/2015) 60 tablet 1  . metroNIDAZOLE (FLAGYL) 500 MG tablet Take 4 tablets (2,000 mg total) by mouth once. (Patient not taking: Reported on 01/13/2015) 2 tablet 0  . simvastatin (ZOCOR) 40 MG tablet Take 1 tablet (40 mg total) by mouth at bedtime. (Patient not taking: Reported on 01/13/2015) 90 tablet 1   No current facility-administered medications for this visit.    Functional Status: No flowsheet data found.  Fall/Depression Screening: PHQ 2/9 Scores 01/13/2015  12/21/2014 12/08/2014 11/02/2014 06/15/2013 03/14/2013  PHQ - 2 Score 1 4 4 2 4 4   PHQ- 9 Score - 12 15 - 18 19    Assessment: 1. Medication assistance: patient can typically afford her medications but currently does not have her card to pay for the medications. She should have her card this week but reports that she still does not have it. She also does not have transportation to come to the clinic or to go to the pharmacy.  Plan: 1. Medication assistance: will discuss with Dr. Dianah Field as to how to proceed. Will also share the information with Brooke Rivera, Social Worker, for further assistance. Told patient that I would call her back once I have heard back from the physician.   Nicoletta Ba, PharmD, [place] [facility] Resident [phone]

## 2015-01-25 NOTE — Telephone Encounter (Signed)
HH RN, aide, and SW orders placed with face-to-face.  Hilton Sinclair, MD

## 2015-01-25 NOTE — Patient Outreach (Signed)
[place] [facility]) Care Management  01/25/2015  Tyronda Vizcarrondo March 23, 1961 [id number]   This RNCM was successful in making telephone contact with patient to update her on application for [employer] [place] offered through [facility] Program.  Patient advised that application has been completed by Hunt Oris, [place] and was awaiting signature by patient's primary care physician.  Patient and RNCM agreed on home visit on Friday, January 28, 2015 for assistance in completing application for [employer] offered by [facility].

## 2015-01-26 ENCOUNTER — Telehealth: Payer: Self-pay | Admitting: Family Medicine

## 2015-01-26 ENCOUNTER — Encounter: Payer: Self-pay | Admitting: Pharmacist

## 2015-01-26 ENCOUNTER — Encounter: Payer: Self-pay | Admitting: Family Medicine

## 2015-01-26 NOTE — Addendum Note (Signed)
Addended by: Conni Slipper T on: 01/26/2015 02:51 PM   Modules accepted: Orders

## 2015-01-26 NOTE — Progress Notes (Signed)
I used the clinic indigent funds to pick up two of the patient's prescriptions (duloxetine and levothyroxine) at [facility] (the patient's preferred pharmacy). I delivered both of the medications to the patient's home and gave them directly to the patient. I offered medication counseling on both of her medications but she refused. I requested that she follow up with Dr. Dianah Field as soon as she was able to.   I took with me $6.00 from the clinic funds but the prescriptions only cost $2.40. I will return the change to the funds.  Nicoletta Ba, PharmD, BCPS [phone]

## 2015-01-26 NOTE — Telephone Encounter (Signed)
Error

## 2015-01-26 NOTE — Progress Notes (Signed)
Completed and returned to Hunt Oris.  Hilton Sinclair, MD

## 2015-01-26 NOTE — Progress Notes (Signed)
Thank you so much!  Hilton Sinclair, MD

## 2015-01-26 NOTE — Progress Notes (Signed)
Thank you so much Erline Levine for your willingness to help this patient receive her medications.  Hunt Oris, MSW, [place]

## 2015-01-27 DIAGNOSIS — F332 Major depressive disorder, recurrent severe without psychotic features: Secondary | ICD-10-CM | POA: Diagnosis not present

## 2015-01-28 ENCOUNTER — Other Ambulatory Visit: Payer: Self-pay

## 2015-01-28 NOTE — Progress Notes (Signed)
PCS form faxed to [employer].  Hunt Oris, MSW, [place]

## 2015-01-28 NOTE — Patient Outreach (Signed)
[facility]) Care Management  01/28/2015  Brooke Rivera 1961/08/10 [id number]   Arrive at patient's home for scheduled, routine home visit. Patient did not answer door. Call made to her number as listed, [phone]. Patient identified herself using HIPPA identifiers. Patient told this RNCM she was at bank getting money out, she would be at home in 5 minutes. This RNCM waited 20 minutes before calling patient again. Patient stated she was out trying to pay bills.  This RNCM discussed with patient the content of visit was discuss with her the intent to make a referral to [facility]'s literacy program.  Patient was in agreement and expressed thanks for the referral. Discussed with patient this RNCM's intent to discharge since case management goals  Have been met. Patient stated agreement and noted she would call if further case management needs arise.    Plan: Discharge patient by sending message in basket to Lurline Del with rationale for discharge being all case management goals have been met.

## 2015-01-31 ENCOUNTER — Encounter: Payer: Self-pay | Admitting: Family Medicine

## 2015-01-31 ENCOUNTER — Telehealth: Payer: Self-pay

## 2015-01-31 DIAGNOSIS — F332 Major depressive disorder, recurrent severe without psychotic features: Secondary | ICD-10-CM | POA: Diagnosis not present

## 2015-01-31 NOTE — Patient Outreach (Signed)
[facility]) Care Management  01/31/2015  Brooke Rivera 1960/11/24 [id number]   Telephonic Care Coordination Note:  Inbound call received from [facility], Contact:  Oneita Kras [phone] Mariann Laster states plan to follow-up with patient to make sure patient continues to get needed medications.   Patient has Medicaid and should be able to get medications with low co-pay.  [place] completed on 01/25/2015.  H/o clinic indigent funds used for needed meds on 01/26/2015.   H/o transportation has been an issue for patient in picking up meds.  H/o patient discharged from [place] services on 01/28/2015   RN CM advised that based on history;  patient would benefit for continued Case Management intervention with [facility] to monitor continued compliance with medication compliance. Mariann Laster agrees to plan and confirms access to Epic MR.   Mariann Laster, RN, BSN, [facility], CCM  [employer] Management Coordinator [phone] Office [phone] Direct [phone] Cell

## 2015-02-04 DIAGNOSIS — F332 Major depressive disorder, recurrent severe without psychotic features: Secondary | ICD-10-CM | POA: Diagnosis not present

## 2015-02-08 NOTE — Patient Outreach (Signed)
[facility]) Care Management  02/08/2015  Lincoln Kleiner 12-16-1960 [id number]   Notification from Erenest Rasher, RN to close case due to goal met.  Ronnell Freshwater. [place], [place] Management [place] Assistant Phone: [phone] Fax: [phone]

## 2015-02-21 DIAGNOSIS — F332 Major depressive disorder, recurrent severe without psychotic features: Secondary | ICD-10-CM | POA: Diagnosis not present

## 2015-02-24 DIAGNOSIS — F332 Major depressive disorder, recurrent severe without psychotic features: Secondary | ICD-10-CM | POA: Diagnosis not present

## 2015-03-07 DIAGNOSIS — F332 Major depressive disorder, recurrent severe without psychotic features: Secondary | ICD-10-CM | POA: Diagnosis not present

## 2015-03-10 DIAGNOSIS — F332 Major depressive disorder, recurrent severe without psychotic features: Secondary | ICD-10-CM | POA: Diagnosis not present

## 2015-03-14 DIAGNOSIS — F332 Major depressive disorder, recurrent severe without psychotic features: Secondary | ICD-10-CM | POA: Diagnosis not present

## 2015-03-18 DIAGNOSIS — F332 Major depressive disorder, recurrent severe without psychotic features: Secondary | ICD-10-CM | POA: Diagnosis not present

## 2015-03-21 DIAGNOSIS — F332 Major depressive disorder, recurrent severe without psychotic features: Secondary | ICD-10-CM | POA: Diagnosis not present

## 2015-03-25 DIAGNOSIS — F332 Major depressive disorder, recurrent severe without psychotic features: Secondary | ICD-10-CM | POA: Diagnosis not present

## 2015-03-28 DIAGNOSIS — F332 Major depressive disorder, recurrent severe without psychotic features: Secondary | ICD-10-CM | POA: Diagnosis not present

## 2015-03-31 DIAGNOSIS — F332 Major depressive disorder, recurrent severe without psychotic features: Secondary | ICD-10-CM | POA: Diagnosis not present

## 2015-04-13 ENCOUNTER — Other Ambulatory Visit: Payer: Self-pay | Admitting: Pharmacist

## 2015-04-13 NOTE — Patient Outreach (Signed)
[facility]) Care Management  [place]   04/13/2015  Brooke Rivera [birth year] [id number]  Brooke Rivera is a 54 y.o. female who was being followed by [place] for medication assistance. No further pharmacy issues have been identified. Will close out of pharmacy program and send a letter to patient and provider.  Nicoletta Ba, PharmD, [facility] [phone]

## 2015-04-18 DIAGNOSIS — F332 Major depressive disorder, recurrent severe without psychotic features: Secondary | ICD-10-CM | POA: Diagnosis not present

## 2015-04-21 DIAGNOSIS — F332 Major depressive disorder, recurrent severe without psychotic features: Secondary | ICD-10-CM | POA: Diagnosis not present

## 2015-04-25 DIAGNOSIS — F332 Major depressive disorder, recurrent severe without psychotic features: Secondary | ICD-10-CM | POA: Diagnosis not present

## 2015-04-28 DIAGNOSIS — F332 Major depressive disorder, recurrent severe without psychotic features: Secondary | ICD-10-CM | POA: Diagnosis not present

## 2015-05-05 ENCOUNTER — Encounter: Payer: Self-pay | Admitting: Obstetrics and Gynecology

## 2015-05-05 ENCOUNTER — Ambulatory Visit (INDEPENDENT_AMBULATORY_CARE_PROVIDER_SITE_OTHER): Payer: Medicare Other | Admitting: Obstetrics and Gynecology

## 2015-05-05 VITALS — BP 117/95 | HR 78 | Temp 98.1°F | Ht 61.0 in | Wt 124.3 lb

## 2015-05-05 DIAGNOSIS — R1310 Dysphagia, unspecified: Secondary | ICD-10-CM | POA: Diagnosis not present

## 2015-05-05 DIAGNOSIS — E039 Hypothyroidism, unspecified: Secondary | ICD-10-CM | POA: Diagnosis not present

## 2015-05-05 DIAGNOSIS — E049 Nontoxic goiter, unspecified: Secondary | ICD-10-CM

## 2015-05-05 DIAGNOSIS — E042 Nontoxic multinodular goiter: Secondary | ICD-10-CM | POA: Diagnosis not present

## 2015-05-05 MED ORDER — LEVOTHYROXINE SODIUM 75 MCG PO TABS: 75.0000 ug | ORAL_TABLET | Freq: Every day | ORAL | Status: DC

## 2015-05-05 NOTE — Patient Instructions (Signed)
Please take medication daily until told otherwise  Someone will call you about an appointment in the next couple of weeks  Monitor for signs of worsening symptoms such as worsening ability to swallow, weight loss, fever, difficulty breathing and seek help immediately  Will contact you about your test results   Goiter Goiter is an enlarged thyroid gland. The thyroid gland sits at the base of the front of the neck. The gland produces hormones that regulate mood, body temperature, pulse rate, and digestion. Most goiters are painless and are not a cause for serious concern. Goiters and conditions that cause goiters can be treated if necessary.  CAUSES  Common causes of goiter include:  Graves disease (causes too much hormone to be produced [hyperthyroidism]).  Hashimoto disease (causes too little hormone to be produced [hypothyroidism]).  Thyroiditis (inflammation of the thyroid sometimes caused by virus or pregnancy).  Nodular goiter (small bumps form; sometimes called toxic nodular goiter).  Pregnancy.  Thyroid cancer (very few goiters with nodules are cancerous).  Certain medications.  Radiation exposure.  Iodine deficiency (more common in developing countries in inland populations). RISK FACTORS Risk factors for goiter include:  A family history of goiter.  Female gender.  Inadequate iodine in the diet.  Age older than 76 years. SYMPTOMS  Many goiters do not cause symptoms. When symptoms do occur, they may include:  Swelling in the lower part of the neck. This swelling can range from a very small bump to a large lump.  A tight feeling in the throat.  A hoarse voice. Less commonly, a goiter may result in:  Coughing.  Wheezing.  Difficulty swallowing.  Difficulty breathing.  Bulging neck veins.  Dizziness. When a goiter is the result of hyperthyroidism, symptoms may include:  Rapid or irregular heartbeat.  Sickness in your stomach  (nausea).  Vomiting.  Diarrhea.  Shaking.  Irritable feeling.  Bulging eyes.  Weight loss.  Heat sensitivity.  Anxiety. When a goiter is the result of hypothyroidism, symptoms may include:  Tiredness.  Dry skin.  Constipation.  Weight gain.  Irregular menstrual cycle.  Depressed mood.  Sensitivity to cold. DIAGNOSIS  Tests used to diagnose goiter include:  A physical exam.  Blood tests, including thyroid hormone levels and antibody testing.  Ultrasonography, computerized X-ray scan (computed tomography, CT) or computerized magnetic scan (magnetic resonance imaging, MRI).  Thyroid scan (imaging along with safe radioactive injection).  Tissue sample taken (biopsy) of nodules. This is sometimes done to confirm that the nodules are not cancerous. TREATMENT  Treatment will depend on the cause of the goiter. Treatment may include:  Monitoring. In some cases, no treatment is necessary, and your doctor will monitor your condition at regular checkups.  Medications and supplements. Thyroid medication (thyroid hormone replacement) is available for hyperthyroidism and hypothyroidism.  If inflammation is the cause, over-the-counter medication or steroid medication may be recommended.  Goiters caused by iodine deficiency can be treated with iodine supplements or changes in diet.  Radioactive iodine treatment. Radioactive iodine is injected into the blood. It travels to the thyroid gland, kills thyroid cells, and reduces the size of the gland. This is only used when the thyroid gland is overactive. Lifelong thyroid hormone medication is often necessary after this treatment.  Surgery. A procedure to remove all or part of the gland may be recommended in severe cases or when cancer is the cause. Hormones can be taken to replace the hormones normally produced by the thyroid. HOME CARE INSTRUCTIONS   Take  medications as directed.  Follow your caregiver's recommendations for  any dietary changes.  Follow up with your caregiver for further examination and testing, as directed. PREVENTION   If you have a family history of goiter, discuss screening with your doctor.  Make sure you are getting enough iodine in your diet.  Use of iodized table salt can help prevent iodine deficiency. Document Released: 01/31/2010 Document Revised: 12/28/2013 Document Reviewed: 01/31/2010 [facility] Patient Information 2015 [place], [place]. This information is not intended to replace advice given to you by your health care provider. Make sure you discuss any questions you have with your health care provider.

## 2015-05-05 NOTE — Progress Notes (Signed)
     Subjective: Chief Complaint  Patient presents with  . Thyroid Problem    HPI: Brooke Rivera is a 54 y.o. presenting to clinic today to discuss the following:  #Thyroid: -patient states she feels her thyroid is worsening -says that it is harder now and larger -She has stopped taking her synthroid due to feeling like it was not helping her symtpoms -- stopped taking in June -harder for her to swallow -says that she is starting to get more headaches and when she turns her head a certain way she feels throbbing in her neck -thinks her voice is changing -symptoms: fatigue, cold -weight stable by our vitals but patient thinks she has been losing weight -denies: fevers or chills, tremors, airway compromise   ROS in HPI.  Past Medical, Surgical, Social, and Family History Reviewed & Updated per EMR.   Objective: BP 117/95 mmHg  Pulse 78  Temp(Src) 98.1 F (36.7 C) (Oral)  Ht 5\' 1"  (1.549 m)  Wt 124 lb 4.8 oz (56.382 kg)  BMI 23.50 kg/m2  Physical Exam  Constitutional: She is well-developed, well-nourished, and in no distress.  HENT:  Head: Normocephalic and atraumatic.  Mouth/Throat: Oropharynx is clear and moist and mucous membranes are normal.  Neck: Decreased range of motion present. Thyroid mass and thyromegaly present.  Thyroid enlarged, grossly more enlarged on the right, firm throughout with possible nodule, no tenderness or erythema or warmth. Voice slightly muffled.   Cardiovascular: Normal rate.   Neurological: She is alert. She has normal sensation and normal strength. She displays no tremor.    Assessment/Plan: Please see problem based Assessment and Plan   Orders Placed This Encounter  Procedures  . TSH  . T4, free  . T3, Free  . Ambulatory referral to ENT    Referral Priority:  Routine    Referral Type:  Consultation    Referral Reason:  Specialty Services Required    Requested Specialty:  Otolaryngology    Number of Visits Requested:  1     Meds ordered this encounter  Medications  . levothyroxine (SYNTHROID, LEVOTHROID) 75 MCG tablet    Sig: Take 1 tablet (75 mcg total) by mouth daily before breakfast.    Dispense:  30 tablet    Refill:  0    Luiz Blare, DO PGY-2, [place]

## 2015-05-06 LAB — T4, FREE: Free T4: 0.75 ng/dL — ABNORMAL LOW (ref 0.80–1.80)

## 2015-05-06 LAB — TSH: TSH: 9.213 u[IU]/mL — AB (ref 0.350–4.500)

## 2015-05-06 LAB — T3, FREE: T3 FREE: 2.3 pg/mL (ref 2.3–4.2)

## 2015-05-09 DIAGNOSIS — F332 Major depressive disorder, recurrent severe without psychotic features: Secondary | ICD-10-CM | POA: Diagnosis not present

## 2015-05-10 ENCOUNTER — Encounter: Payer: Self-pay | Admitting: Obstetrics and Gynecology

## 2015-05-10 NOTE — Assessment & Plan Note (Signed)
Patient with diffuse goiter with possible nodules. Thyroid US done suggestive of Hashimoto's thyroiditis on 11/2014. She was started on synthroid but has not been compliant. Now with possible enlargement and obstructing symptoms (voice changes, headaches, difficulty swallowing but without respiratory compromise). Vitals stable. Cannot rule out possible malignancy. -Will recollect thyroid studies -encouraged patient to continue medication. Increased Synthroid to 52mcg -will place referral to ENT at this time for possible thyroid removal and further work-up. -return precautions discussed

## 2015-05-12 DIAGNOSIS — F332 Major depressive disorder, recurrent severe without psychotic features: Secondary | ICD-10-CM | POA: Diagnosis not present

## 2015-05-16 DIAGNOSIS — F332 Major depressive disorder, recurrent severe without psychotic features: Secondary | ICD-10-CM | POA: Diagnosis not present

## 2015-05-19 DIAGNOSIS — F332 Major depressive disorder, recurrent severe without psychotic features: Secondary | ICD-10-CM | POA: Diagnosis not present

## 2015-05-23 DIAGNOSIS — F332 Major depressive disorder, recurrent severe without psychotic features: Secondary | ICD-10-CM | POA: Diagnosis not present

## 2015-05-27 DIAGNOSIS — F332 Major depressive disorder, recurrent severe without psychotic features: Secondary | ICD-10-CM | POA: Diagnosis not present

## 2015-05-30 DIAGNOSIS — F332 Major depressive disorder, recurrent severe without psychotic features: Secondary | ICD-10-CM | POA: Diagnosis not present

## 2015-06-03 DIAGNOSIS — F332 Major depressive disorder, recurrent severe without psychotic features: Secondary | ICD-10-CM | POA: Diagnosis not present

## 2015-06-16 ENCOUNTER — Other Ambulatory Visit (HOSPITAL_COMMUNITY): Payer: Self-pay | Admitting: Otolaryngology

## 2015-06-16 DIAGNOSIS — R1310 Dysphagia, unspecified: Secondary | ICD-10-CM | POA: Diagnosis not present

## 2015-06-16 DIAGNOSIS — E049 Nontoxic goiter, unspecified: Secondary | ICD-10-CM | POA: Diagnosis not present

## 2015-06-16 DIAGNOSIS — R0600 Dyspnea, unspecified: Secondary | ICD-10-CM | POA: Diagnosis not present

## 2015-07-01 ENCOUNTER — Encounter (HOSPITAL_COMMUNITY): Payer: Self-pay

## 2015-07-01 ENCOUNTER — Encounter (HOSPITAL_COMMUNITY)
Admission: RE | Admit: 2015-07-01 | Discharge: 2015-07-01 | Disposition: A | Discharge status: Home / Self Care | Payer: Medicare Other | Source: Ambulatory Visit | Attending: Anesthesiology | Admitting: Anesthesiology

## 2015-07-01 ENCOUNTER — Encounter (HOSPITAL_COMMUNITY)
Admission: RE | Admit: 2015-07-01 | Discharge: 2015-07-01 | Disposition: A | Discharge status: Home / Self Care | Payer: Medicare Other | Source: Ambulatory Visit | Attending: Otolaryngology | Admitting: Otolaryngology

## 2015-07-01 DIAGNOSIS — F17200 Nicotine dependence, unspecified, uncomplicated: Secondary | ICD-10-CM | POA: Diagnosis not present

## 2015-07-01 DIAGNOSIS — Z01818 Encounter for other preprocedural examination: Secondary | ICD-10-CM | POA: Insufficient documentation

## 2015-07-01 DIAGNOSIS — E049 Nontoxic goiter, unspecified: Secondary | ICD-10-CM | POA: Insufficient documentation

## 2015-07-01 DIAGNOSIS — J9811 Atelectasis: Secondary | ICD-10-CM | POA: Diagnosis not present

## 2015-07-01 DIAGNOSIS — Z9889 Other specified postprocedural states: Secondary | ICD-10-CM | POA: Diagnosis not present

## 2015-07-01 HISTORY — DX: Autoimmune thyroiditis: E06.3

## 2015-07-01 LAB — CBC
HCT: 39.9 % (ref 36.0–46.0)
HEMOGLOBIN: 12.5 g/dL (ref 12.0–15.0)
MCH: 22.9 pg — ABNORMAL LOW (ref 26.0–34.0)
MCHC: 31.3 g/dL (ref 30.0–36.0)
MCV: 73.1 fL — ABNORMAL LOW (ref 78.0–100.0)
Platelets: 492 10*3/uL — ABNORMAL HIGH (ref 150–400)
RBC: 5.46 MIL/uL — AB (ref 3.87–5.11)
RDW: 16.2 % — ABNORMAL HIGH (ref 11.5–15.5)
WBC: 9.4 10*3/uL (ref 4.0–10.5)

## 2015-07-01 LAB — BASIC METABOLIC PANEL
ANION GAP: 7 (ref 5–15)
BUN: 8 mg/dL (ref 6–20)
CALCIUM: 9.1 mg/dL (ref 8.9–10.3)
CO2: 30 mmol/L (ref 22–32)
Chloride: 101 mmol/L (ref 101–111)
Creatinine, Ser: 1 mg/dL (ref 0.44–1.00)
Glucose, Bld: 90 mg/dL (ref 65–99)
Potassium: 4 mmol/L (ref 3.5–5.1)
SODIUM: 138 mmol/L (ref 135–145)

## 2015-07-01 NOTE — Pre-Procedure Instructions (Addendum)
Brooke Rivera  07/01/2015      [facility] [place] [place] - [address] [place] [address] Phone: [phone] Fax: [phone]    Your procedure is scheduled on 07/08/15.  Report to [facility] short stay admitting at  [place].M.  Call this number if you have problems the morning of surgery:  [phone]   Remember:  Do not eat food or drink liquids after midnight.  Take these medicines the morning of surgery with A SIP OF WATER levothyroxine   STOP all herbel meds, nsaids (aleve,naproxen,advil,ibuprofen) 5 days prior to surgery starting 07/03/15 including vitamins, aspirin  Do not wear jewelry, make-up or nail polish.  Do not wear lotions, powders, or perfumes.  You may wear deodorant.  Do not shave 48 hours prior to surgery.  Men may shave face and neck.  Do not bring valuables to the hospital.  [facility] is not responsible for any belongings or valuables.  Contacts, dentures or bridgework may not be worn into surgery.  Leave your suitcase in the car.  After surgery it may be brought to your room.  For patients admitted to the hospital, discharge time will be determined by your treatment team.  Patients discharged the day of surgery will not be allowed to drive home.   Name and phone number of your driver:    Special instructions:   Special Instructions: [place] - Preparing for Surgery  Before surgery, you can play an important role.  Because skin is not sterile, your skin needs to be as free of germs as possible.  You can reduce the number of germs on you skin by washing with CHG (chlorahexidine gluconate) soap before surgery.  CHG is an antiseptic cleaner which kills germs and bonds with the skin to continue killing germs even after washing.  Please DO NOT use if you have an allergy to CHG or antibacterial soaps.  If your skin becomes reddened/irritated stop using the CHG and inform your nurse when you arrive at  Short Stay.  Do not shave (including legs and underarms) for at least 48 hours prior to the first CHG shower.  You may shave your face.  Please follow these instructions carefully:   1.  Shower with CHG Soap the night before surgery and the morning of Surgery.  2.  If you choose to wash your hair, wash your hair first as usual with your normal shampoo.  3.  After you shampoo, rinse your hair and body thoroughly to remove the Shampoo.  4.  Use CHG as you would any other liquid soap.  You can apply chg directly  to the skin and wash gently with scrungie or a clean washcloth.  5.  Apply the CHG Soap to your body ONLY FROM THE NECK DOWN.  Do not use on open wounds or open sores.  Avoid contact with your eyes ears, mouth and genitals (private parts).  Wash genitals (private parts)       with your normal soap.  6.  Wash thoroughly, paying special attention to the area where your surgery will be performed.  7.  Thoroughly rinse your body with warm water from the neck down.  8.  DO NOT shower/wash with your normal soap after using and rinsing off the CHG Soap.  9.  Pat yourself dry with a clean towel.            10.  Wear clean pajamas.  11.  Place clean sheets on your bed the night of your first shower and do not sleep with pets.  Day of Surgery  Do not apply any lotions/deodorants the morning of surgery.  Please wear clean clothes to the hospital/surgery center.  Please read over the following fact sheets that you were given. Pain Booklet, Coughing and Deep Breathing and Surgical Site Infection Prevention

## 2015-07-06 NOTE — H&P (Signed)
07/08/2015 9:16 AM  Brooke Rivera  PREOPERATIVE HISTORY AND PHYSICAL  CHIEF COMPLAINT: compressive thyroid goiter  HISTORY: This is a 54 year old who presents with compressive thyroid goiter.  She now presents for total thyroidectomy.  Dr. Simeon Craft, [place] has discussed the risks (bleeding, death, infection, perioperative MI, vocal cord paralysis, need for tracheotomy, hoarseness, hypocalcemia,  etc.), benefits, and alternatives of this procedure. The patient understands the risks and would like to proceed with the procedure. The chances of success of the procedure are >50% and the patient understands this. I personally performed an examination of the patient within 24 hours of the procedure.  PAST MEDICAL HISTORY: Past Medical History  Diagnosis Date  . Hyperlipidemia   . Hypertension   . Goiter, lymphadenoid     thyroid  . Depression     rx not taking at this time  .   PAST SURGICAL HISTORY: No past surgical history on file.  MEDICATIONS: No current facility-administered medications on file prior to encounter.   Current Outpatient Prescriptions on File Prior to Encounter  Medication Sig Dispense Refill  . desloratadine (CLARINEX) 5 MG tablet Take 1 tablet (5 mg total) by mouth daily. (Patient not taking: Reported on 01/13/2015) 30 tablet 2  . DULoxetine (CYMBALTA) 30 MG capsule Take 1 capsule (30 mg total) by mouth daily. (Patient not taking: Reported on 01/13/2015) 90 capsule 1  . hydrochlorothiazide (HYDRODIURIL) 12.5 MG tablet Take 1 tablet (12.5 mg total) by mouth daily. (Patient taking differently: Take 12.5 mg by mouth daily. None taken in 2-3 months  Dr knows per patient) 30 tablet 5  . levothyroxine (SYNTHROID, LEVOTHROID) 75 MCG tablet Take 1 tablet (75 mcg total) by mouth daily before breakfast. (Patient not taking: Reported on 06/30/2015) 30 tablet 0  . simvastatin (ZOCOR) 40 MG tablet Take 1 tablet (40 mg total) by mouth at bedtime. (Patient not taking: Reported on  01/13/2015) 90 tablet 1    ALLERGIES: Allergies  Allergen Reactions  . Other Other (See Comments)    Redness face ,itching with cabbage    SOCIAL HISTORY: Social History   Social History  . Marital Status: Single    Spouse Name: N/A  . Number of Children: N/A  . Years of Education: N/A   Occupational History  . Not on file.   Social History Main Topics  . Smoking status: Current Every Day Smoker -- 0.25 packs/day    Types: Cigarettes  . Smokeless tobacco: Never Used  . Alcohol Use: 8.4 oz/week    14 Cans of beer per week     Comment: occ per patient on 07/01/15  . Drug Use: No  . Sexual Activity: Not on file   Other Topics Concern  . Not on file   Social History Narrative   FAMILY HISTORY: Family History  Problem Relation Age of Onset  . Hyperlipidemia Mother   . Cancer Mother   . Cancer Father     REVIEW OF SYSTEMS:  HEENT: dysphagia, neck tightness, otherwise per HPI x 12 systems   PHYSICAL EXAM:  GENERAL:  nad VITAL SIGNS:   Filed Vitals:   07/08/15 0900  BP: 166/80  Pulse: 73  Temp: 97.6 F (36.4 C)  Resp: 18   SKIN: warm, dry  HEENT/NECK/LYMPH::  Oral cavity clear, large, symmetric, very firm bilaterally enlarged thyroid lobes ABDOMEN: soft  MUSCULOSKELETAL: normal strength PSYCH:  normal affect NEUROLOGIC:  Cn 2-12 intact and symmetric  DIAGNOSTIC STUDIES: thyroid ultrasound shows fairly symmetric ~ 6cm thyroid lobes with no  focal nodules.  ASSESSMENT AND PLAN: Plan to proceed with total thyroidectomy for compressive thyroid goiter. Patient understands the risks, benefits, and alternatives. Informed written consent signed witnessed and on chart 07/08/15  9:18 AM  Brooke Rivera

## 2015-07-08 ENCOUNTER — Encounter (HOSPITAL_COMMUNITY): Payer: Self-pay | Admitting: Anesthesiology

## 2015-07-08 ENCOUNTER — Observation Stay (HOSPITAL_COMMUNITY)
Admission: RE | Admit: 2015-07-08 | Discharge: 2015-07-09 | Disposition: A | Discharge status: Home / Self Care | Payer: Medicare Other | Source: Ambulatory Visit | Attending: Otolaryngology | Admitting: Otolaryngology

## 2015-07-08 ENCOUNTER — Inpatient Hospital Stay (HOSPITAL_COMMUNITY): Payer: Medicare Other | Admitting: Anesthesiology

## 2015-07-08 ENCOUNTER — Observation Stay (HOSPITAL_COMMUNITY): Payer: Medicare Other

## 2015-07-08 ENCOUNTER — Encounter (HOSPITAL_COMMUNITY)
Admission: RE | Disposition: A | Discharge status: Home / Self Care | Payer: Self-pay | Source: Ambulatory Visit | Attending: Otolaryngology

## 2015-07-08 DIAGNOSIS — F17210 Nicotine dependence, cigarettes, uncomplicated: Secondary | ICD-10-CM | POA: Diagnosis not present

## 2015-07-08 DIAGNOSIS — Z9889 Other specified postprocedural states: Secondary | ICD-10-CM

## 2015-07-08 DIAGNOSIS — I10 Essential (primary) hypertension: Secondary | ICD-10-CM | POA: Insufficient documentation

## 2015-07-08 DIAGNOSIS — E063 Autoimmune thyroiditis: Secondary | ICD-10-CM | POA: Diagnosis not present

## 2015-07-08 DIAGNOSIS — E785 Hyperlipidemia, unspecified: Secondary | ICD-10-CM | POA: Diagnosis not present

## 2015-07-08 DIAGNOSIS — J9811 Atelectasis: Secondary | ICD-10-CM | POA: Diagnosis not present

## 2015-07-08 DIAGNOSIS — E049 Nontoxic goiter, unspecified: Secondary | ICD-10-CM | POA: Diagnosis present

## 2015-07-08 DIAGNOSIS — E042 Nontoxic multinodular goiter: Principal | ICD-10-CM | POA: Insufficient documentation

## 2015-07-08 HISTORY — PX: THYROIDECTOMY: SHX17

## 2015-07-08 LAB — COMPREHENSIVE METABOLIC PANEL
ALT: 18 U/L (ref 14–54)
ANION GAP: 7 (ref 5–15)
AST: 23 U/L (ref 15–41)
Albumin: 3.6 g/dL (ref 3.5–5.0)
Alkaline Phosphatase: 67 U/L (ref 38–126)
BUN: 7 mg/dL (ref 6–20)
CALCIUM: 8.8 mg/dL — AB (ref 8.9–10.3)
CHLORIDE: 102 mmol/L (ref 101–111)
CO2: 27 mmol/L (ref 22–32)
Creatinine, Ser: 0.97 mg/dL (ref 0.44–1.00)
GFR calc non Af Amer: >60 mL/min (ref >60)
Glucose, Bld: 147 mg/dL — ABNORMAL HIGH (ref 65–99)
POTASSIUM: 3.8 mmol/L (ref 3.5–5.1)
SODIUM: 136 mmol/L (ref 135–145)
Total Bilirubin: 0.2 mg/dL — ABNORMAL LOW (ref 0.3–1.2)
Total Protein: 7.9 g/dL (ref 6.5–8.1)

## 2015-07-08 LAB — CBC
HEMATOCRIT: 38.4 % (ref 36.0–46.0)
HEMOGLOBIN: 12.3 g/dL (ref 12.0–15.0)
MCH: 23.2 pg — ABNORMAL LOW (ref 26.0–34.0)
MCHC: 32 g/dL (ref 30.0–36.0)
MCV: 72.5 fL — ABNORMAL LOW (ref 78.0–100.0)
Platelets: 452 10*3/uL — ABNORMAL HIGH (ref 150–400)
RBC: 5.3 MIL/uL — ABNORMAL HIGH (ref 3.87–5.11)
RDW: 16.3 % — AB (ref 11.5–15.5)
WBC: 14 10*3/uL — AB (ref 4.0–10.5)

## 2015-07-08 LAB — PHOSPHORUS: PHOSPHORUS: 3.8 mg/dL (ref 2.5–4.6)

## 2015-07-08 LAB — MAGNESIUM: Magnesium: 1.9 mg/dL (ref 1.7–2.4)

## 2015-07-08 SURGERY — THYROIDECTOMY
Anesthesia: General | Site: Neck | Laterality: Bilateral

## 2015-07-08 MED ORDER — ACETAMINOPHEN 650 MG RE SUPP: 650.0000 mg | Freq: Four times a day (QID) | RECTAL | Status: DC | PRN

## 2015-07-08 MED ORDER — DIPHENHYDRAMINE HCL 50 MG/ML IJ SOLN: 25.0000 mg | Freq: Four times a day (QID) | INTRAMUSCULAR | Status: DC | PRN

## 2015-07-08 MED ORDER — MIDAZOLAM HCL 5 MG/5ML IJ SOLN
INTRAMUSCULAR | Status: DC | PRN
  Administered 2015-07-08: 2 mg via INTRAVENOUS

## 2015-07-08 MED ORDER — HEMOSTATIC AGENTS (NO CHARGE) OPTIME
TOPICAL | Status: DC | PRN
  Administered 2015-07-08: 1 via TOPICAL

## 2015-07-08 MED ORDER — DEXAMETHASONE SODIUM PHOSPHATE 10 MG/ML IJ SOLN
10.0000 mg | Freq: Once | INTRAMUSCULAR | Status: AC
  Administered 2015-07-09: 10 mg via INTRAVENOUS
  Filled 2015-07-08: qty 1

## 2015-07-08 MED ORDER — ACETAMINOPHEN 325 MG PO TABS: 650.0000 mg | ORAL_TABLET | Freq: Four times a day (QID) | ORAL | Status: DC | PRN

## 2015-07-08 MED ORDER — FENTANYL CITRATE (PF) 250 MCG/5ML IJ SOLN
INTRAMUSCULAR | Status: AC
  Filled 2015-07-08: qty 5

## 2015-07-08 MED ORDER — MORPHINE SULFATE (PF) 2 MG/ML IV SOLN: 2.0000 mg | INTRAVENOUS | Status: DC | PRN

## 2015-07-08 MED ORDER — LACTATED RINGERS IV SOLN
INTRAVENOUS | Status: DC | PRN
  Administered 2015-07-08 (×2): via INTRAVENOUS

## 2015-07-08 MED ORDER — 0.9 % SODIUM CHLORIDE (POUR BTL) OPTIME
TOPICAL | Status: DC | PRN
  Administered 2015-07-08: 1000 mL

## 2015-07-08 MED ORDER — IBUPROFEN 600 MG PO TABS: 600.0000 mg | ORAL_TABLET | Freq: Four times a day (QID) | ORAL | Status: DC | PRN

## 2015-07-08 MED ORDER — PROPOFOL 10 MG/ML IV BOLUS
INTRAVENOUS | Status: AC
  Filled 2015-07-08: qty 20

## 2015-07-08 MED ORDER — HYDROCODONE-ACETAMINOPHEN 5-325 MG PO TABS
1.0000 | ORAL_TABLET | ORAL | Status: DC | PRN
  Administered 2015-07-08 (×2): 2 via ORAL
  Filled 2015-07-08 (×2): qty 2

## 2015-07-08 MED ORDER — LIDOCAINE-EPINEPHRINE 1 %-1:100000 IJ SOLN
INTRAMUSCULAR | Status: AC
  Filled 2015-07-08: qty 1

## 2015-07-08 MED ORDER — DIPHENHYDRAMINE HCL 25 MG PO CAPS
25.0000 mg | ORAL_CAPSULE | Freq: Four times a day (QID) | ORAL | Status: DC | PRN
  Administered 2015-07-09: 25 mg via ORAL
  Filled 2015-07-08: qty 1

## 2015-07-08 MED ORDER — PROPOFOL 10 MG/ML IV BOLUS
INTRAVENOUS | Status: DC | PRN
  Administered 2015-07-08: 50 mg via INTRAVENOUS
  Administered 2015-07-08: 150 mg via INTRAVENOUS
  Administered 2015-07-08: 30 mg via INTRAVENOUS

## 2015-07-08 MED ORDER — MIDAZOLAM HCL 2 MG/2ML IJ SOLN
INTRAMUSCULAR | Status: AC
  Filled 2015-07-08: qty 4

## 2015-07-08 MED ORDER — BISACODYL 5 MG PO TBEC: 5.0000 mg | DELAYED_RELEASE_TABLET | Freq: Every day | ORAL | Status: DC | PRN

## 2015-07-08 MED ORDER — ARTIFICIAL TEARS OP OINT
TOPICAL_OINTMENT | OPHTHALMIC | Status: DC | PRN
  Administered 2015-07-08: 1 via OPHTHALMIC

## 2015-07-08 MED ORDER — ONDANSETRON HCL 4 MG/2ML IJ SOLN: 4.0000 mg | Freq: Four times a day (QID) | INTRAMUSCULAR | Status: DC | PRN

## 2015-07-08 MED ORDER — CEFAZOLIN SODIUM 1-5 GM-% IV SOLN
1.0000 g | Freq: Three times a day (TID) | INTRAVENOUS | Status: AC
  Administered 2015-07-08 – 2015-07-09 (×3): 1 g via INTRAVENOUS
  Filled 2015-07-08 (×4): qty 50

## 2015-07-08 MED ORDER — CALCIUM CARBONATE-VITAMIN D 500-200 MG-UNIT PO TABS
2.0000 | ORAL_TABLET | Freq: Three times a day (TID) | ORAL | Status: DC
  Administered 2015-07-08 – 2015-07-09 (×3): 2 via ORAL
  Filled 2015-07-08 (×3): qty 2

## 2015-07-08 MED ORDER — LACTATED RINGERS IV SOLN
INTRAVENOUS | Status: DC
  Administered 2015-07-08: 10:00:00 via INTRAVENOUS

## 2015-07-08 MED ORDER — CLINDAMYCIN PHOSPHATE 600 MG/50ML IV SOLN
600.0000 mg | Freq: Once | INTRAVENOUS | Status: AC
  Administered 2015-07-08: 600 mg via INTRAVENOUS
  Filled 2015-07-08 (×2): qty 50

## 2015-07-08 MED ORDER — ONDANSETRON HCL 4 MG/2ML IJ SOLN
INTRAMUSCULAR | Status: DC | PRN
  Administered 2015-07-08: 4 mg via INTRAVENOUS

## 2015-07-08 MED ORDER — HYDROCHLOROTHIAZIDE 25 MG PO TABS
12.5000 mg | ORAL_TABLET | Freq: Every day | ORAL | Status: DC
  Administered 2015-07-08 – 2015-07-09 (×2): 12.5 mg via ORAL
  Filled 2015-07-08 (×2): qty 1

## 2015-07-08 MED ORDER — LIDOCAINE HCL (CARDIAC) 20 MG/ML IV SOLN
INTRAVENOUS | Status: DC | PRN
  Administered 2015-07-08: 70 mg via INTRAVENOUS

## 2015-07-08 MED ORDER — FENTANYL CITRATE (PF) 100 MCG/2ML IJ SOLN
INTRAMUSCULAR | Status: DC | PRN
  Administered 2015-07-08 (×2): 100 ug via INTRAVENOUS
  Administered 2015-07-08 (×2): 50 ug via INTRAVENOUS
  Administered 2015-07-08: 100 ug via INTRAVENOUS

## 2015-07-08 MED ORDER — ONDANSETRON 4 MG PO TBDP: 4.0000 mg | ORAL_TABLET | Freq: Four times a day (QID) | ORAL | Status: DC | PRN

## 2015-07-08 MED ORDER — SIMVASTATIN 40 MG PO TABS
40.0000 mg | ORAL_TABLET | Freq: Every day | ORAL | Status: DC
  Administered 2015-07-08: 40 mg via ORAL
  Filled 2015-07-08: qty 1

## 2015-07-08 MED ORDER — SUCCINYLCHOLINE CHLORIDE 20 MG/ML IJ SOLN
INTRAMUSCULAR | Status: DC | PRN
  Administered 2015-07-08: 110 mg via INTRAVENOUS

## 2015-07-08 MED ORDER — KCL IN DEXTROSE-NACL 10-5-0.45 MEQ/L-%-% IV SOLN
INTRAVENOUS | Status: DC
  Administered 2015-07-08: 17:00:00 via INTRAVENOUS
  Administered 2015-07-09: 1 mL via INTRAVENOUS
  Filled 2015-07-08 (×3): qty 1000

## 2015-07-08 MED ORDER — LIDOCAINE-EPINEPHRINE 1 %-1:100000 IJ SOLN
INTRAMUSCULAR | Status: DC | PRN
  Administered 2015-07-08: 20 mL

## 2015-07-08 MED ORDER — DEXAMETHASONE SODIUM PHOSPHATE 10 MG/ML IJ SOLN
10.0000 mg | Freq: Once | INTRAMUSCULAR | Status: AC
  Administered 2015-07-08: 10 mg via INTRAVENOUS
  Filled 2015-07-08: qty 1

## 2015-07-08 MED ORDER — SODIUM CHLORIDE 0.9 % IV SOLN
10.0000 mg | INTRAVENOUS | Status: DC | PRN
  Administered 2015-07-08: 20 ug/min via INTRAVENOUS

## 2015-07-08 MED ORDER — LEVOTHYROXINE SODIUM 75 MCG PO TABS
75.0000 ug | ORAL_TABLET | Freq: Every day | ORAL | Status: DC
  Administered 2015-07-09: 75 ug via ORAL
  Filled 2015-07-08 (×2): qty 1
  Filled 2015-07-08: qty 3

## 2015-07-08 SURGICAL SUPPLY — 72 items
APPLIER CLIP 9.375 SM OPEN (CLIP) ×3
ATTRACTOMAT 16X20 MAGNETIC DRP (DRAPES) IMPLANT
BLADE SURG 15 STRL LF DISP TIS (BLADE) IMPLANT
BLADE SURG 15 STRL SS (BLADE)
BLADE SURG ROTATE 9660 (MISCELLANEOUS) IMPLANT
CANISTER SUCTION 2500CC (MISCELLANEOUS) ×3 IMPLANT
CLEANER TIP ELECTROSURG 2X2 (MISCELLANEOUS) ×3 IMPLANT
CLIP APPLIE 9.375 SM OPEN (CLIP) ×1 IMPLANT
CONT SPEC 4OZ CLIKSEAL STRL BL (MISCELLANEOUS) ×3 IMPLANT
CORDS BIPOLAR (ELECTRODE) ×3 IMPLANT
COVER SURGICAL LIGHT HANDLE (MISCELLANEOUS) ×3 IMPLANT
CRADLE DONUT ADULT HEAD (MISCELLANEOUS) IMPLANT
DERMABOND ADVANCED (GAUZE/BANDAGES/DRESSINGS) ×2
DERMABOND ADVANCED .7 DNX12 (GAUZE/BANDAGES/DRESSINGS) ×1 IMPLANT
DRAIN CHANNEL 15F RND FF W/TCR (WOUND CARE) IMPLANT
DRAIN HEMOVAC 1/8 X 5 (WOUND CARE) ×6 IMPLANT
DRAIN JACKSON PRATT 10MM FLAT (MISCELLANEOUS) IMPLANT
DRAIN JACKSON RD 7FR 3/32 (WOUND CARE) IMPLANT
DRAPE PROXIMA HALF (DRAPES) IMPLANT
ELECT COATED BLADE 2.86 ST (ELECTRODE) ×3 IMPLANT
ELECT REM PT RETURN 9FT ADLT (ELECTROSURGICAL) ×3
ELECTRODE REM PT RTRN 9FT ADLT (ELECTROSURGICAL) ×1 IMPLANT
EVACUATOR SILICONE 100CC (DRAIN) ×6 IMPLANT
GAUZE SPONGE 4X4 16PLY XRAY LF (GAUZE/BANDAGES/DRESSINGS) ×3 IMPLANT
GLOVE BIO SURGEON STRL SZ 6.5 (GLOVE) ×6 IMPLANT
GLOVE BIO SURGEONS STRL SZ 6.5 (GLOVE) ×3
GLOVE BIOGEL PI IND STRL 6.5 (GLOVE) ×3 IMPLANT
GLOVE BIOGEL PI IND STRL 7.5 (GLOVE) ×1 IMPLANT
GLOVE BIOGEL PI IND STRL 8.5 (GLOVE) ×1 IMPLANT
GLOVE BIOGEL PI INDICATOR 6.5 (GLOVE) ×6
GLOVE BIOGEL PI INDICATOR 7.5 (GLOVE) ×2
GLOVE BIOGEL PI INDICATOR 8.5 (GLOVE) ×2
GLOVE SURG SS PI 6.5 STRL IVOR (GLOVE) ×6 IMPLANT
GLOVE SURG SS PI 7.5 STRL IVOR (GLOVE) ×3 IMPLANT
GLOVE SURG SS PI 8.5 STRL IVOR (GLOVE) ×2
GLOVE SURG SS PI 8.5 STRL STRW (GLOVE) ×1 IMPLANT
GOWN STRL REUS W/ TWL LRG LVL3 (GOWN DISPOSABLE) ×3 IMPLANT
GOWN STRL REUS W/TWL LRG LVL3 (GOWN DISPOSABLE) ×6
HEMOSTAT SURGICEL 2X14 (HEMOSTASIS) ×3 IMPLANT
KIT BASIN OR (CUSTOM PROCEDURE TRAY) ×3 IMPLANT
KIT ROOM TURNOVER OR (KITS) ×3 IMPLANT
LOCATOR NERVE 3 VOLT (DISPOSABLE) IMPLANT
MARKER SKIN DUAL TIP RULER LAB (MISCELLANEOUS) ×3 IMPLANT
NEEDLE HYPO 25GX1X1/2 BEV (NEEDLE) ×3 IMPLANT
NS IRRIG 1000ML POUR BTL (IV SOLUTION) ×3 IMPLANT
PAD ARMBOARD 7.5X6 YLW CONV (MISCELLANEOUS) ×6 IMPLANT
PENCIL BUTTON HOLSTER BLD 10FT (ELECTRODE) ×3 IMPLANT
SHEARS HARMONIC 9CM CVD (BLADE) ×3 IMPLANT
SPONGE GAUZE 4X4 12PLY STER LF (GAUZE/BANDAGES/DRESSINGS) ×3 IMPLANT
SPONGE INTESTINAL PEANUT (DISPOSABLE) IMPLANT
STAPLER VISISTAT 35W (STAPLE) ×3 IMPLANT
SUT CHROMIC 4 0 PS 2 18 (SUTURE) ×6 IMPLANT
SUT ETHILON 3 0 FSL (SUTURE) ×6 IMPLANT
SUT ETHILON 3 0 PS 1 (SUTURE) ×3 IMPLANT
SUT MNCRL AB 4-0 PS2 18 (SUTURE) IMPLANT
SUT PLAIN 5 0 P 3 18 (SUTURE) ×3 IMPLANT
SUT SILK 2 0 (SUTURE) ×4
SUT SILK 2 0 SH CR/8 (SUTURE) ×6 IMPLANT
SUT SILK 2-0 18XBRD TIE 12 (SUTURE) ×2 IMPLANT
SUT VIC AB 3-0 SH 27 (SUTURE) ×2
SUT VIC AB 3-0 SH 27X BRD (SUTURE) ×1 IMPLANT
SUT VIC AB 3-0 SH 8-18 (SUTURE) ×6 IMPLANT
SUT VICRYL 4 0 EN S 48 D7796 (SUTURE) IMPLANT
SYRINGE 10CC LL (SYRINGE) ×3 IMPLANT
TOWEL OR 17X24 6PK STRL BLUE (TOWEL DISPOSABLE) ×3 IMPLANT
TRAY ENT MC OR (CUSTOM PROCEDURE TRAY) ×3 IMPLANT
TRAY FOLEY CATH 16FR SILVER (SET/KITS/TRAYS/PACK) IMPLANT
TUBE ENDOTRAC EMG 7X10.2 (MISCELLANEOUS) IMPLANT
TUBE ENDOTRAC EMG 8X11.3 (MISCELLANEOUS) IMPLANT
TUBE ENDOTRACH  EMG 6MMTUBE EN (MISCELLANEOUS)
TUBE ENDOTRACH EMG 6MMTUBE EN (MISCELLANEOUS) IMPLANT
WATER STERILE IRR 1000ML POUR (IV SOLUTION) ×3 IMPLANT

## 2015-07-08 NOTE — Op Note (Signed)
07/08/2015 Surgeon: Ruby Cola Assistant:Louise Sallee Provencal, PA-C Procedure performed: [phone] total thyroidectomy with identification and preservation of the recurrent laryngeal nerve bilaterally Preoperative Diagnosis: compressive thyroid goiter Postoperative Diagnosis: compressive thyroid goiter EBL: less than 233mL Anesthesia: General Via Endotracheal with nerve monitoring ETtube Complications: none Operative Findings: identified and preserved the right and left recurrent laryngeal nerves, identified and preserved the right inferior and the left superior and inferior parathyroids. firm and diffusely enlarged thyroid gland Specimens: right and left thyroid lobes and pretracheal lymph node. Drains: bilateral 93mm flat JP drains  Operative Details: The patient was brought to the operating room and prepped and draped in the usual sterile fashion. A low collar incision was made in the standard fashion in a natural skin crease and was carried down through the platysma muscles. The strap muscles were divided in the midline until the thyroid was identified. Next dissection first proceeded with dissection of the right lobe of the thyroid. This was done from inferior pole to superior pole fashion using blunt dissection and harmonic scalpel to identify and divide the inferior thyroid arteries. I identified and preserved the right inferior parathyroid glands.I hugged the capsule and dissected the right lobe superiorly. The right thyroid lobe was then reflected up until it was attached to the trachea and ligament of Gwenlyn Found was identified. I identified and preserved the right recurrent laryngeal nerve and traced this up until it inserted into the right cricothyroid membrane. I then used the Bovie and harmonic to remove the right lobe of the thyroid from the anterior trachea and I passed off the right thyroid lobe. There was a small weak area in the tracheal cartilage in the right side of the second tracheal ring  on the right from the compression from the thyroid lobe that I reinforced with a running 3-0 vicryl suture. No full-thickness opening in the trachea was noted. I then switched to the left thyroid lobe. Dissection continued the same way as the right lobe proceeding from inferior to superior pole fashion using blunt dissection and the harmonic. I identified and divided the left inferior and superior thyroid arteries/veins, and I identified and preserved the left inferior and superior parathyroid glands. Dissection continued medially until reaching the trachea and identifying the  ligament of Berry. I identified and preserved the left recurrent laryngeal nerve and dissected and preserved this as it inserted into the left cricothyroid membrane. Once the left lobe of the thyroid was freed from the anterior trachea, it was passed from the field as left thyroid lobe. I also removed a midline pretracheal node and sent this for pathology. Surgicel was placed in the wound bilaterally after I again confirmed that the bilateral recurrent laryngeal nerves were intact and stimulating normally. Hemostasis was noted bilaterally. The surgicel was left in place and the strap muscles were reapproximated loosely with interrupted 3-0 Vicryl sutures. The platysma was reapproximated as well with several interrupted 3-0 Vicryl sutures and the skin was closed with dermabond. Bilateral 10 mm flat JP drains were placed deep to the strap muscles bilaterally and brought out inferior to the skin incision. The patient was awakened, extubated, and taken to post-anesthesia care unit in stable condition.   Dr. Thomes Lolling MDPhD was present and performed the entire procedure.   2:46 PM  07/08/2015  Ruby Cola, MD

## 2015-07-08 NOTE — Anesthesia Preprocedure Evaluation (Signed)
Anesthesia Evaluation  Patient identified by MRN, date of birth, ID band Patient awake    Reviewed: Allergy & Precautions, NPO status , Patient's Chart, lab work & pertinent test results  Airway Mallampati: II  TM Distance: >3 FB Neck ROM: Full    Dental   Pulmonary Current Smoker,    breath sounds clear to auscultation       Cardiovascular hypertension,  Rhythm:Regular Rate:Normal     Neuro/Psych    GI/Hepatic Neg liver ROS, GERD  ,  Endo/Other  Hypothyroidism   Renal/GU negative Renal ROS     Musculoskeletal   Abdominal   Peds  Hematology   Anesthesia Other Findings   Reproductive/Obstetrics                             Anesthesia Physical Anesthesia Plan  ASA: III  Anesthesia Plan: General   Post-op Pain Management:    Induction: Intravenous  Airway Management Planned: Oral ETT  Additional Equipment:   Intra-op Plan:   Post-operative Plan: Extubation in OR  Informed Consent:   Dental advisory given  Plan Discussed with: Anesthesiologist and CRNA  Anesthesia Plan Comments:         Anesthesia Quick Evaluation

## 2015-07-08 NOTE — Transfer of Care (Signed)
Immediate Anesthesia Transfer of Care Note  Patient: Brooke Rivera  Procedure(s) Performed: Procedure(s): BILATERAL THYROIDECTOMY (Bilateral)  Patient Location: PACU  Anesthesia Type:General  Level of Consciousness: awake, sedated, patient cooperative and responds to stimulation  Airway & Oxygen Therapy: Patient Spontanous Breathing and Patient connected to face mask oxygen  Post-op Assessment: Report given to RN, Post -op Vital signs reviewed and stable, Patient moving all extremities and Patient moving all extremities X 4  Post vital signs: Reviewed and stable  Last Vitals:  Filed Vitals:   07/08/15 0900  BP: 166/80  Pulse: 73  Temp: 36.4 C  Resp: 18    Complications: No apparent anesthesia complications

## 2015-07-08 NOTE — Progress Notes (Signed)
Subjective: S/p total thyroidectomy for compressive goiter. Awake, alert, conversant  Objective: Vital signs in last 24 hours: Temp:  [97.6 F (36.4 C)-97.9 F (36.6 C)] 97.9 F (36.6 C) (11/11 1438) Pulse Rate:  [73-94] 90 (11/11 1452) Resp:  [18-23] 18 (11/11 1452) BP: (143-166)/(77-84) 143/84 mmHg (11/11 1452) SpO2:  [100 %] 100 % (11/11 1452) Weight:  [58.514 kg (129 lb)] 58.514 kg (129 lb) (11/11 0900)  Neck supple, JP drains holding bulb suction bilaterally with sanguinous drainage, incision clean dry and intact, able to speak in complete sentences  @LABLAST2 (wbc:2,hgb:2,hct:2,plt:2) No results for input(s): NA, K, CL, CO2, GLUCOSE, BUN, CREATININE, CALCIUM in the last 72 hours.  Medications:  Scheduled Meds: . calcium-vitamin D  2 tablet Oral TID  .  ceFAZolin (ANCEF) IV  1 g Intravenous 3 times per day  . hydrochlorothiazide  12.5 mg Oral Daily  . [START ON 07/09/2015] levothyroxine  75 mcg Oral QAC breakfast  . simvastatin  40 mg Oral QHS   Continuous Infusions: . dextrose 5 % and 0.45 % NaCl with KCl 10 mEq/L    . lactated ringers 50 mL/hr at 07/08/15 0954   PRN Meds:.acetaminophen **OR** acetaminophen, bisacodyl, diphenhydrAMINE **OR** diphenhydrAMINE, HYDROcodone-acetaminophen, ibuprofen, morphine injection, ondansetron **OR** ondansetron (ZOFRAN) IV  Assessment/Plan: Stable s/p total thyroidectomy for compressive thyroid goiter with intraoperative identification and preservation of the recurrent laryngeal nerve bilaterally. Started home synthroid dose and added TID calcium with vitamin D. Labs today and in the morning to monitor calcium levels. Her Rx for hydrocodone is on chart, Rx for synthroid 111mcg daily, antibiotic, and zofran PRN sent to pharmacy. Will monitor JP output and home when calcium level stable and drain output sufficiently low.   LOS: 0 days   Ruby Cola 07/08/2015, 3:11 PM

## 2015-07-08 NOTE — Progress Notes (Signed)
07/08/2015 5:58 PM  [name] [id number]  Post-Op Check    Temp:  [97.6 F (36.4 C)-98 F (36.7 C)] 98 F (36.7 C) (11/11 1530) Pulse Rate:  [73-94] 80 (11/11 1530) Resp:  [18-23] 21 (11/11 1530) BP: (143-166)/(77-90) 145/83 mmHg (11/11 1521) SpO2:  [100 %] 100 % (11/11 1530) Weight:  [58.514 kg (129 lb)] 58.514 kg (129 lb) (11/11 0900),     Intake/Output Summary (Last 24 hours) at 07/08/15 1758 Last data filed at 07/08/15 1719  Gross per 24 hour  Intake 2028.33 ml  Output    721 ml  Net 1307.33 ml   Drain: 20 ml  Results for orders placed or performed during the hospital encounter of 07/08/15 (from the past 24 hour(s))  CBC     Status: Abnormal   Collection Time: 07/08/15  5:20 PM  Result Value Ref Range   WBC 14.0 (H) 4.0 - 10.5 K/uL   RBC 5.30 (H) 3.87 - 5.11 MIL/uL   Hemoglobin 12.3 12.0 - 15.0 g/dL   HCT 38.4 36.0 - 46.0 %   MCV 72.5 (L) 78.0 - 100.0 fL   MCH 23.2 (L) 26.0 - 34.0 pg   MCHC 32.0 30.0 - 36.0 g/dL   RDW 16.3 (H) 11.5 - 15.5 %   Platelets 452 (H) 150 - 400 K/uL    SUBJECTIVE:  Mild pain. No SOB.  Ambulatory.  spont void  OBJECTIVE:  Voice very raspy but no stridor.  Neck flat, drains functioning  IMPRESSION:  Satisfactory check  PLAN:  Possible drains out and discharge in AM.  MOnitor calcium  Brooke Rivera

## 2015-07-08 NOTE — Discharge Instructions (Signed)
Follow up with Dr. Simeon Craft in 1 week. Incision has dermabond so keep dry for 48 hours then may gently clean with soap and water. Rx for 173mcg daily synthroid dose, antibiotic, and zofran sent to pharmacy, hydrocodone Rx is on patient chart. Start over the counter Oscal with D TID (calcium with vitamin D 500/200 tablet) 2 tablets three times daily.call if any facial or hand/muscle spasms or pain, or other signs or symptoms of low calcium levels.

## 2015-07-08 NOTE — Anesthesia Procedure Notes (Addendum)
Procedure Name: Intubation Date/Time: 07/08/2015 11:21 AM Performed by: Jacquiline Doe A Pre-anesthesia Checklist: Patient identified, Timeout performed, Patient being monitored, Emergency Drugs available and Suction available Patient Re-evaluated:Patient Re-evaluated prior to inductionOxygen Delivery Method: Circle system utilized Preoxygenation: Pre-oxygenation with 100% oxygen Intubation Type: IV induction and Cricoid Pressure applied Ventilation: Mask ventilation without difficulty Laryngoscope Size: Mac and 3 Grade View: Grade I Tube type: Oral (NIM OETT .) Tube size: 7.0 mm Number of attempts: 1 Airway Equipment and Method: Stylet Placement Confirmation: ETT inserted through vocal cords under direct vision,  breath sounds checked- equal and bilateral and positive ETCO2 Secured at: 21 cm Tube secured with: Tape Dental Injury: Teeth and Oropharynx as per pre-operative assessment  Comments: NIM OETT .

## 2015-07-08 NOTE — Anesthesia Postprocedure Evaluation (Signed)
  Anesthesia Post-op Note  Patient: Brooke Rivera  Procedure(s) Performed: Procedure(s): BILATERAL THYROIDECTOMY (Bilateral)  Patient Location: PACU  Anesthesia Type:General  Level of Consciousness: awake  Airway and Oxygen Therapy: Patient Spontanous Breathing  Post-op Pain: mild  Post-op Assessment: Post-op Vital signs reviewed              Post-op Vital Signs: Reviewed  Last Vitals:  Filed Vitals:   07/08/15 1452  BP: 143/84  Pulse: 90  Temp:   Resp: 18    Complications: No apparent anesthesia complications

## 2015-07-09 DIAGNOSIS — E785 Hyperlipidemia, unspecified: Secondary | ICD-10-CM | POA: Diagnosis not present

## 2015-07-09 DIAGNOSIS — E042 Nontoxic multinodular goiter: Secondary | ICD-10-CM | POA: Diagnosis not present

## 2015-07-09 DIAGNOSIS — I10 Essential (primary) hypertension: Secondary | ICD-10-CM | POA: Diagnosis not present

## 2015-07-09 DIAGNOSIS — F17210 Nicotine dependence, cigarettes, uncomplicated: Secondary | ICD-10-CM | POA: Diagnosis not present

## 2015-07-09 LAB — COMPREHENSIVE METABOLIC PANEL
ALT: 17 U/L (ref 14–54)
AST: 20 U/L (ref 15–41)
Albumin: 3.6 g/dL (ref 3.5–5.0)
Alkaline Phosphatase: 68 U/L (ref 38–126)
Anion gap: 8 (ref 5–15)
CHLORIDE: 98 mmol/L — AB (ref 101–111)
CO2: 31 mmol/L (ref 22–32)
CREATININE: 0.9 mg/dL (ref 0.44–1.00)
Calcium: 9.8 mg/dL (ref 8.9–10.3)
GFR calc Af Amer: >60 mL/min (ref >60)
GFR calc non Af Amer: >60 mL/min (ref >60)
Glucose, Bld: 100 mg/dL — ABNORMAL HIGH (ref 65–99)
Potassium: 4.8 mmol/L (ref 3.5–5.1)
SODIUM: 137 mmol/L (ref 135–145)
Total Bilirubin: 0.3 mg/dL (ref 0.3–1.2)
Total Protein: 8.5 g/dL — ABNORMAL HIGH (ref 6.5–8.1)

## 2015-07-09 LAB — CBC
HEMATOCRIT: 40.9 % (ref 36.0–46.0)
HEMOGLOBIN: 13 g/dL (ref 12.0–15.0)
MCH: 23.3 pg — AB (ref 26.0–34.0)
MCHC: 31.8 g/dL (ref 30.0–36.0)
MCV: 73.3 fL — ABNORMAL LOW (ref 78.0–100.0)
Platelets: 472 10*3/uL — ABNORMAL HIGH (ref 150–400)
RBC: 5.58 MIL/uL — ABNORMAL HIGH (ref 3.87–5.11)
RDW: 16.4 % — ABNORMAL HIGH (ref 11.5–15.5)
WBC: 13.7 10*3/uL — ABNORMAL HIGH (ref 4.0–10.5)

## 2015-07-09 LAB — PHOSPHORUS: Phosphorus: 4.2 mg/dL (ref 2.5–4.6)

## 2015-07-09 LAB — MAGNESIUM: Magnesium: 2 mg/dL (ref 1.7–2.4)

## 2015-07-09 NOTE — Progress Notes (Signed)
Pt scheduled for discharge home this pm. Discharge education provided, no concerns voiced

## 2015-07-09 NOTE — Discharge Summary (Signed)
07/09/2015 12:49 PM  [name] [id number]  Post-Op Day 1    Temp:  [97.8 F (36.6 C)-98.5 F (36.9 C)] 98.5 F (36.9 C) (11/12 0507) Pulse Rate:  [64-94] 72 (11/12 0507) Resp:  [16-23] 18 (11/12 0507) BP: (143-183)/(77-90) 164/87 mmHg (11/12 0507) SpO2:  [96 %-100 %] 100 % (11/12 0507) Weight:  [60.1 kg (132 lb 7.9 oz)] 60.1 kg (132 lb 7.9 oz) (11/11 1550),     Intake/Output Summary (Last 24 hours) at 07/09/15 1249 Last data filed at 07/09/15 0730  Gross per 24 hour  Intake 3389.58 ml  Output   3941 ml  Net -551.42 ml   Drain:  90 ml  Results for orders placed or performed during the hospital encounter of 07/08/15 (from the past 24 hour(s))  Comprehensive metabolic panel     Status: Abnormal   Collection Time: 07/08/15  5:20 PM  Result Value Ref Range   Sodium 136 135 - 145 mmol/L   Potassium 3.8 3.5 - 5.1 mmol/L   Chloride 102 101 - 111 mmol/L   CO2 27 22 - 32 mmol/L   Glucose, Bld 147 (H) 65 - 99 mg/dL   BUN 7 6 - 20 mg/dL   Creatinine, Ser 0.97 0.44 - 1.00 mg/dL   Calcium 8.8 (L) 8.9 - 10.3 mg/dL   Total Protein 7.9 6.5 - 8.1 g/dL   Albumin 3.6 3.5 - 5.0 g/dL   AST 23 15 - 41 U/L   ALT 18 14 - 54 U/L   Alkaline Phosphatase 67 38 - 126 U/L   Total Bilirubin 0.2 (L) 0.3 - 1.2 mg/dL   GFR calc non Af Amer >60 >60 mL/min   GFR calc Af Amer >60 >60 mL/min   Anion gap 7 5 - 15  Magnesium     Status: None   Collection Time: 07/08/15  5:20 PM  Result Value Ref Range   Magnesium 1.9 1.7 - 2.4 mg/dL  Phosphorus     Status: None   Collection Time: 07/08/15  5:20 PM  Result Value Ref Range   Phosphorus 3.8 2.5 - 4.6 mg/dL  CBC     Status: Abnormal   Collection Time: 07/08/15  5:20 PM  Result Value Ref Range   WBC 14.0 (H) 4.0 - 10.5 K/uL   RBC 5.30 (H) 3.87 - 5.11 MIL/uL   Hemoglobin 12.3 12.0 - 15.0 g/dL   HCT 38.4 36.0 - 46.0 %   MCV 72.5 (L) 78.0 - 100.0 fL   MCH 23.2 (L) 26.0 - 34.0 pg   MCHC 32.0 30.0 - 36.0 g/dL   RDW 16.3 (H) 11.5 - 15.5 %   Platelets 452 (H) 150 - 400 K/uL  Comprehensive metabolic panel     Status: Abnormal   Collection Time: 07/09/15  4:49 AM  Result Value Ref Range   Sodium 137 135 - 145 mmol/L   Potassium 4.8 3.5 - 5.1 mmol/L   Chloride 98 (L) 101 - 111 mmol/L   CO2 31 22 - 32 mmol/L   Glucose, Bld 100 (H) 65 - 99 mg/dL   BUN <5 (L) 6 - 20 mg/dL   Creatinine, Ser 0.90 0.44 - 1.00 mg/dL   Calcium 9.8 8.9 - 10.3 mg/dL   Total Protein 8.5 (H) 6.5 - 8.1 g/dL   Albumin 3.6 3.5 - 5.0 g/dL   AST 20 15 - 41 U/L   ALT 17 14 - 54 U/L   Alkaline Phosphatase 68 38 - 126 U/L   Total Bilirubin  0.3 0.3 - 1.2 mg/dL   GFR calc non Af Amer >60 >60 mL/min   GFR calc Af Amer >60 >60 mL/min   Anion gap 8 5 - 15  Magnesium     Status: None   Collection Time: 07/09/15  4:49 AM  Result Value Ref Range   Magnesium 2.0 1.7 - 2.4 mg/dL  Phosphorus     Status: None   Collection Time: 07/09/15  4:49 AM  Result Value Ref Range   Phosphorus 4.2 2.5 - 4.6 mg/dL  CBC     Status: Abnormal   Collection Time: 07/09/15  4:49 AM  Result Value Ref Range   WBC 13.7 (H) 4.0 - 10.5 K/uL   RBC 5.58 (H) 3.87 - 5.11 MIL/uL   Hemoglobin 13.0 12.0 - 15.0 g/dL   HCT 40.9 36.0 - 46.0 %   MCV 73.3 (L) 78.0 - 100.0 fL   MCH 23.3 (L) 26.0 - 34.0 pg   MCHC 31.8 30.0 - 36.0 g/dL   RDW 16.4 (H) 11.5 - 15.5 %   Platelets 472 (H) 150 - 400 K/uL    SUBJECTIVE:  Min pain.  Breathing OK. Voice is baseline per patient.  Voiding OK.  OBJECTIVE:  Neck wound flat.  Voice raspy but not stridorous.  Drains removed without difficulty.  IMPRESSION:  Satisfactory check  PLAN:  Discharge to home and care of family  Admit: 11 NOV Discharge:  12 NOV Final Diagnosis:  Multinodular goiter Procedure:  Total Thyroidectomy, 11 NOV Comp:  None Cond: ambulatory, taking po diet.  Breathing well. Ca++ stable Recheck: 1 week Dr, Simeon Craft Rx:  Hydrocodone, Calcium supplements Instructions written and given  Hosp Course:  Underwent total thyroidectomy on day  of admission.  Observed overnight. Min drainage.  Min pain.  Taking po.  Voiding well.  Voice raspy but baseline per patient. No breathing difficulty.  AM Ca++ 9.8.  Drains removed and pt discharged on POD 1.    Jodi Marble

## 2015-07-09 NOTE — Progress Notes (Signed)
Pt discharged home this pm. Condition stable

## 2015-07-11 ENCOUNTER — Encounter (HOSPITAL_COMMUNITY): Payer: Self-pay | Admitting: Otolaryngology

## 2015-08-01 DIAGNOSIS — E063 Autoimmune thyroiditis: Secondary | ICD-10-CM | POA: Diagnosis not present

## 2015-08-01 DIAGNOSIS — J3800 Paralysis of vocal cords and larynx, unspecified: Secondary | ICD-10-CM | POA: Diagnosis not present

## 2015-08-01 DIAGNOSIS — R490 Dysphonia: Secondary | ICD-10-CM | POA: Diagnosis not present

## 2015-08-25 DIAGNOSIS — F332 Major depressive disorder, recurrent severe without psychotic features: Secondary | ICD-10-CM | POA: Diagnosis not present

## 2015-08-31 ENCOUNTER — Ambulatory Visit: Payer: Medicare Other | Admitting: Obstetrics and Gynecology

## 2015-09-08 DIAGNOSIS — F332 Major depressive disorder, recurrent severe without psychotic features: Secondary | ICD-10-CM | POA: Diagnosis not present

## 2015-09-15 DIAGNOSIS — F332 Major depressive disorder, recurrent severe without psychotic features: Secondary | ICD-10-CM | POA: Diagnosis not present

## 2015-09-30 ENCOUNTER — Ambulatory Visit: Payer: Medicare Other | Admitting: Obstetrics and Gynecology

## 2015-11-09 ENCOUNTER — Other Ambulatory Visit: Payer: Self-pay | Admitting: Obstetrics and Gynecology

## 2015-11-09 MED ORDER — LEVOTHYROXINE SODIUM 100 MCG PO TABS: 100.0000 ug | ORAL_TABLET | Freq: Every day | ORAL | Status: DC

## 2015-11-09 NOTE — Telephone Encounter (Signed)
Pt needs refill on her thyroid medicine. She doesn't know the name of the medication.  Uses the [facility]

## 2015-11-09 NOTE — Telephone Encounter (Signed)
Spoke with patient and she "thinks she is on 14mcg".  Will forward to MD.  Patient has been scheduled for 11-18-15 at 1:30pm. [name] Hartsell,CMA

## 2015-11-09 NOTE — Telephone Encounter (Signed)
Please call patient and ask her to follow-up with me. Unsure of what dose she is taking of synthroid since her surgery. Also dose may need to be adjusted so she should be following up with someone every few months. Dr. Simeon Craft wanted to see her after surgery.

## 2015-11-18 ENCOUNTER — Ambulatory Visit (INDEPENDENT_AMBULATORY_CARE_PROVIDER_SITE_OTHER): Payer: Medicare Other | Admitting: Obstetrics and Gynecology

## 2015-11-18 ENCOUNTER — Encounter: Payer: Self-pay | Admitting: Obstetrics and Gynecology

## 2015-11-18 VITALS — BP 153/84 | HR 75 | Temp 97.8°F | Wt 137.9 lb

## 2015-11-18 DIAGNOSIS — F17200 Nicotine dependence, unspecified, uncomplicated: Secondary | ICD-10-CM

## 2015-11-18 DIAGNOSIS — E890 Postprocedural hypothyroidism: Secondary | ICD-10-CM

## 2015-11-18 NOTE — Assessment & Plan Note (Signed)
Patient s/p thyroidectomy in Nov. 2016 for diffuse goiter. She has not followed up with surgeon but states follow-up in April. Asymptomatic. Possible post-op complication includes voice changes; concern for laryngeal nerve damage during surgery. Continue synthroid 136mcg. Will obtain TSH and PTH today. Adjust medications as indicated based on results.

## 2015-11-18 NOTE — Assessment & Plan Note (Signed)
Current everyday smoker. Not ready to quit and not motivated. Voice changes concerning for possible laryngeal cancer but lower on differential after neck surgery. Quit line information given. Emphasized health benefits of quitting.

## 2015-11-18 NOTE — Progress Notes (Signed)
     Subjective: Chief Complaint  Patient presents with  . Follow-up    Thyroid     HPI: Brooke Rivera is a 55 y.o. presenting to clinic today to discuss the following:  #Thyroid Follow-up: -S/p thyroidectomy 06/2015 -has not had follow-up with surgeon -still taking synthroid dose that was given prior to surgery- 178mcg -ever since surgery has been having voice changes which is concerning to her -has gained some weight; says this is due to improved appetitie ROS: denies fatigue, cold sensitivity, palpatations, tremors  #Tobacco Dependence:  -Current every day smoker; states a pack last 3-4d -not ready to quit  ROS noted in HPI.  Past Medical, Surgical, Social, and Family History Reviewed & Updated per EMR.   Objective: BP 150/88 mmHg  Pulse 77  Temp(Src) 97.8 F (36.6 C) (Oral)  Wt 137 lb 14.4 oz (62.551 kg) Vitals and nursing notes reviewed  Physical Exam  Constitutional: She is well-developed, well-nourished, and in no distress.  HENT:  Mouth/Throat: Oropharynx is clear and moist.  Neck: Normal range of motion. Neck supple.  Surgical scar on neck well-healed  Cardiovascular: Normal rate, regular rhythm and normal heart sounds.   Pulmonary/Chest: Effort normal and breath sounds normal.    Assessment/Plan: Please see problem based Assessment and Plan   Orders Placed This Encounter  Procedures  . TSH  . PTH, Intact and Calcium     Luiz Blare, DO 11/18/2015, 1:47 PM PGY-2, [place]

## 2015-11-18 NOTE — Patient Instructions (Addendum)
Will contact you about lab results and any medication adjustments Consider smoking cessation - Quit line 1-800-QUIT-NOW.  Follow-up with surgeon  Hypothyroidism Hypothyroidism is a disorder of the thyroid. The thyroid is a large gland that is located in the lower front of the neck. The thyroid releases hormones that control how the body works. With hypothyroidism, the thyroid does not make enough of these hormones. CAUSES Causes of hypothyroidism may include:  Viral infections.  Pregnancy.  Your own defense system (immune system) attacking your thyroid.  Certain medicines.  Birth defects.  Past radiation treatments to your head or neck.  Past treatment with radioactive iodine.  Past surgical removal of part or all of your thyroid.  Problems with the gland that is located in the center of your brain (pituitary). SIGNS AND SYMPTOMS Signs and symptoms of hypothyroidism may include:  Feeling as though you have no energy (lethargy).  Inability to tolerate cold.  Weight gain that is not explained by a change in diet or exercise habits.  Dry skin.  Coarse hair.  Menstrual irregularity.  Slowing of thought processes.  Constipation.  Sadness or depression. DIAGNOSIS  Your health care provider may diagnose hypothyroidism with blood tests and ultrasound tests. TREATMENT Hypothyroidism is treated with medicine that replaces the hormones that your body does not make. After you begin treatment, it may take several weeks for symptoms to go away. HOME CARE INSTRUCTIONS   Take medicines only as directed by your health care provider.  If you start taking any new medicines, tell your health care provider.  Keep all follow-up visits as directed by your health care provider. This is important. As your condition improves, your dosage needs may change. You will need to have blood tests regularly so that your health care provider can watch your condition. SEEK MEDICAL CARE  IF:  Your symptoms do not get better with treatment.  You are taking thyroid replacement medicine and:  You sweat excessively.  You have tremors.  You feel anxious.  You lose weight rapidly.  You cannot tolerate heat.  You have emotional swings.  You have diarrhea.  You feel weak. SEEK IMMEDIATE MEDICAL CARE IF:   You develop chest pain.  You develop an irregular heartbeat.  You develop a rapid heartbeat.   This information is not intended to replace advice given to you by your health care provider. Make sure you discuss any questions you have with your health care provider.   Document Released: 08/13/2005 Document Revised: 09/03/2014 Document Reviewed: 12/29/2013 Elsevier Interactive Patient Education [employer].

## 2015-11-18 NOTE — Progress Notes (Addendum)
Dr. Gerarda Fraction requested a [place].   Presenting Issue: Ms. Plett presents with tobacco use  Report of symptoms: No mental health issues reported; patient has some distress about difficulty speaking following surgery on her throat.  Duration of CURRENT symptoms: Not assessed.  Age of onset of first mood disturbance: Not assessed  Impact on function: None.  Psychiatric History - Diagnoses: Tobacco use; depressive disorder; learning disorder (per chart) - Hospitalizations:None - Pharmacotherapy: None - Outpatient therapy: None  Family history of psychiatric issues: Not assessed.  Current and history of substance use: Patient reports smoking one pack every 3-4 days for "a long time"; patient was unable to give a more specific about her use history. Patient also consumes alcohol 2-3 nights per week, and tends to consume approximately 3 beers when she drinks.  Medical conditions that might explain or contribute to symptoms: Not assessed.  Assessment / Plan / Recommendations:Patient and [facility] discussed history and circumstances of patient's tobacco use. Patient reported that she only tends to smoke when she drinks alcohol, which she usually does at a bar. When she drinks, she typically consumes about 3 beers, and drinks at the bar. She reported that she typically goes to the bar and smokes outside with a small group of friends. [facility] used motivational interviewing strategies to enhance motivation for change. Patient reported that she smokes because it is a habit (she was unable to report any other motivating factors), and that she knows that it is "bad for you". However, she did not endorse significant motivation to quit, and stated "if I quit, I quit, and if I don't, I don't". [facility] provided education about negative effects of smoking on the throat and vocal cords. Patient was surprised to learn that smoking can affect the vocal cords. [facility] provided information for following up if  patient endorses desire to quit in the future.

## 2015-11-19 LAB — TSH: TSH: 92.13 m[IU]/L — AB

## 2015-11-21 ENCOUNTER — Other Ambulatory Visit: Payer: Self-pay | Admitting: Obstetrics and Gynecology

## 2015-11-21 ENCOUNTER — Telehealth: Payer: Self-pay | Admitting: Obstetrics and Gynecology

## 2015-11-21 LAB — PTH, INTACT AND CALCIUM
Calcium: 9.5 mg/dL (ref 8.4–10.5)
PTH: 25 pg/mL (ref 14–64)

## 2015-11-21 MED ORDER — LEVOTHYROXINE SODIUM 175 MCG PO TABS: 175.0000 ug | ORAL_TABLET | Freq: Every day | ORAL | Status: DC

## 2015-11-21 NOTE — Telephone Encounter (Signed)
Called patient about her thyroid labs. She is not on the right dose of medication. Sent in a Rx for synthroid 175 mcg. She needs to follow-up in 8 weeks for a re-check. Dose may still need to be adjusted.

## 2016-01-31 ENCOUNTER — Other Ambulatory Visit: Payer: Self-pay | Admitting: *Deleted

## 2016-01-31 MED ORDER — LEVOTHYROXINE SODIUM 175 MCG PO TABS: 175.0000 ug | ORAL_TABLET | Freq: Every day | ORAL | Status: DC

## 2016-01-31 NOTE — Telephone Encounter (Signed)
Patient really needs appointment to follow-up thyroid levels after last medication adjustment. Limited quantity given.

## 2016-05-23 ENCOUNTER — Encounter: Payer: Self-pay | Admitting: Family Medicine

## 2016-05-23 ENCOUNTER — Ambulatory Visit (INDEPENDENT_AMBULATORY_CARE_PROVIDER_SITE_OTHER): Payer: Medicare Other | Admitting: Family Medicine

## 2016-05-23 VITALS — BP 150/91 | HR 70 | Temp 98.5°F | Wt 141.0 lb

## 2016-05-23 DIAGNOSIS — I10 Essential (primary) hypertension: Secondary | ICD-10-CM

## 2016-05-23 DIAGNOSIS — E785 Hyperlipidemia, unspecified: Secondary | ICD-10-CM

## 2016-05-23 MED ORDER — LEVOTHYROXINE SODIUM 175 MCG PO TABS: 175.0000 ug | ORAL_TABLET | Freq: Every day | ORAL | 0 refills | Status: DC

## 2016-05-23 MED ORDER — SIMVASTATIN 40 MG PO TABS: 40.0000 mg | ORAL_TABLET | Freq: Every day | ORAL | 3 refills | Status: DC

## 2016-05-23 MED ORDER — HYDROCHLOROTHIAZIDE 12.5 MG PO TABS: 12.5000 mg | ORAL_TABLET | Freq: Every day | ORAL | 3 refills | Status: DC

## 2016-05-23 NOTE — Assessment & Plan Note (Signed)
Restart simvastatin 40mg  daily.

## 2016-05-23 NOTE — Patient Instructions (Addendum)
Thank you for coming in today to be seen for your blood pressure.  - Please restart hydrochlorothiazide 12.5mg  daily and we will check labs in 4 weeks when you return. - I have also refilled your levothyroxine and simvastatin as you requested.  Please eat a low salt, low fat healthy diet.   Take care and seek immediate care sooner if you develop any concerns.

## 2016-05-23 NOTE — Assessment & Plan Note (Signed)
Restart HCTZ 12.5mg  daily. Check labs in 4 weeks.

## 2016-05-23 NOTE — Progress Notes (Signed)
    Subjective:  Brooke Rivera is a 55 y.o. female who presents to the [facility] today with a chief complaint of high blood pressure.  HPI: States has been off of HCTZ for approximately the last year because was told her BP was good. She therefore has not been watching her diet and eating fried chicken in pork chops. For the past 1 week has noticed frequent HA unrelieved by tylenol, seeing spots, dizziness. Denies LOC, numbness/tingling in extremities  ROS: Per HPI  Objective:  Physical Exam: BP (!) 150/91   Pulse 70   Temp 98.5 F (36.9 C) (Oral)   Wt 141 lb (64 kg)   SpO2 96%   BMI 26.64 kg/m   Gen: NAD, resting comfortably CV: RRR with no murmurs appreciated Pulm: NWOB, CTAB with no crackles, wheezes, or rhonchi GI: Normal bowel sounds present. Soft, Nontender, Nondistended. MSK: no edema, cyanosis, or clubbing noted Skin: warm, dry Neuro: grossly normal, moves all extremities Psych: Normal affect and thought content   Assessment/Plan:  Hypertension, benign Restart HCTZ 12.5mg  daily. Check labs in 4 weeks.  Hyperlipidemia Restart simvastatin 40mg  daily.  Bufford Lope, DO PGY-1, [facility] 05/23/2016 3:33 PM

## 2016-07-23 DIAGNOSIS — Z5181 Encounter for therapeutic drug level monitoring: Secondary | ICD-10-CM | POA: Diagnosis not present

## 2016-07-25 DIAGNOSIS — Z5181 Encounter for therapeutic drug level monitoring: Secondary | ICD-10-CM | POA: Diagnosis not present

## 2016-08-30 ENCOUNTER — Ambulatory Visit: Payer: Medicare Other | Admitting: Obstetrics and Gynecology

## 2016-08-30 NOTE — Progress Notes (Deleted)
     Subjective: No chief complaint on file.    HPI: Brooke Rivera is a 56 y.o. presenting to clinic today to discuss the following:  # # # 56 y.o. year old female presents for well woman/preventative visit and annual GYN examination.  Acute Concerns:  Diet:  Exercise:  Sexual History: Birth history:  LMP:  Birth Control:  POA/Living Will:  Social:  Social History   Social History  . Marital status: Single    Spouse name: N/A  . Number of children: N/A  . Years of education: N/A   Social History Main Topics  . Smoking status: Current Every Day Smoker    Packs/day: 0.25    Types: Cigarettes  . Smokeless tobacco: Never Used  . Alcohol use 8.4 oz/week    14 Cans of beer per week     Comment: occ per patient on 07/01/15  . Drug use: No  . Sexual activity: Not on file   Other Topics Concern  . Not on file   Social History Narrative  . No narrative on file    Health Maintenance Due  Topic Date Due  . Hepatitis C Screening  1960/10/26  . MAMMOGRAM  05/17/2009  . COLONOSCOPY  05/17/2009  . INFLUENZA VACCINE  03/27/2016     ROS noted in HPI.  Past Medical, Surgical, Social, and Family History Reviewed & Updated per EMR. History  Smoking Status  . Current Every Day Smoker  . Packs/day: 0.25  . Types: Cigarettes  Smokeless Tobacco  . Never Used      Objective: There were no vitals taken for this visit. Vitals and nursing notes reviewed  Physical Exam   No results found for this or any previous visit (from the past 72 hour(s)).  Assessment/Plan: 56 y.o. female presents for annual well woman/preventative exam and GYN exam. Please see problem specific assessment and plan.      No orders of the defined types were placed in this encounter.   No orders of the defined types were placed in this encounter.    Luiz Blare, DO 08/30/2016, 6:53 AM PGY-3, [place]

## 2016-09-03 ENCOUNTER — Ambulatory Visit (INDEPENDENT_AMBULATORY_CARE_PROVIDER_SITE_OTHER): Payer: Medicare Other | Admitting: Obstetrics and Gynecology

## 2016-09-03 ENCOUNTER — Encounter: Payer: Self-pay | Admitting: Obstetrics and Gynecology

## 2016-09-03 VITALS — BP 130/90 | HR 75 | Temp 97.8°F | Ht 61.0 in | Wt 153.0 lb

## 2016-09-03 DIAGNOSIS — E785 Hyperlipidemia, unspecified: Secondary | ICD-10-CM

## 2016-09-03 DIAGNOSIS — Z0000 Encounter for general adult medical examination without abnormal findings: Secondary | ICD-10-CM

## 2016-09-03 DIAGNOSIS — Z23 Encounter for immunization: Secondary | ICD-10-CM | POA: Diagnosis not present

## 2016-09-03 DIAGNOSIS — Z9889 Other specified postprocedural states: Secondary | ICD-10-CM

## 2016-09-03 DIAGNOSIS — I10 Essential (primary) hypertension: Secondary | ICD-10-CM | POA: Diagnosis not present

## 2016-09-03 DIAGNOSIS — Z1159 Encounter for screening for other viral diseases: Secondary | ICD-10-CM

## 2016-09-03 DIAGNOSIS — R252 Cramp and spasm: Secondary | ICD-10-CM

## 2016-09-03 DIAGNOSIS — Z9009 Acquired absence of other part of head and neck: Secondary | ICD-10-CM

## 2016-09-03 DIAGNOSIS — Z1211 Encounter for screening for malignant neoplasm of colon: Secondary | ICD-10-CM

## 2016-09-03 DIAGNOSIS — E890 Postprocedural hypothyroidism: Secondary | ICD-10-CM

## 2016-09-03 HISTORY — DX: Cramp and spasm: R25.2

## 2016-09-03 LAB — CBC
HCT: 39.8 % (ref 35.0–45.0)
HEMOGLOBIN: 12.6 g/dL (ref 11.7–15.5)
MCH: 23.9 pg — AB (ref 27.0–33.0)
MCHC: 31.7 g/dL — AB (ref 32.0–36.0)
MCV: 75.5 fL — ABNORMAL LOW (ref 80.0–100.0)
MPV: 8.7 fL (ref 7.5–12.5)
PLATELETS: 310 10*3/uL (ref 140–400)
RBC: 5.27 MIL/uL — AB (ref 3.80–5.10)
RDW: 18.2 % — ABNORMAL HIGH (ref 11.0–15.0)
WBC: 5.7 10*3/uL (ref 3.8–10.8)

## 2016-09-03 LAB — TSH: TSH: 131.69 m[IU]/L — AB

## 2016-09-03 MED ORDER — LEVOTHYROXINE SODIUM 112 MCG PO TABS: 112.0000 ug | ORAL_TABLET | Freq: Every day | ORAL | 0 refills | Status: DC

## 2016-09-03 NOTE — Assessment & Plan Note (Signed)
Preventative visit today. Patient given information to call and schedule mammogram. Order placed for colonoscopy. Checked blood work today to include: CMP, lipid panel, CBC, Hep C, TSH. Flu vaccine given today. Handout given with additional information.

## 2016-09-03 NOTE — Progress Notes (Deleted)
56 y.o. year old female presents for well woman/preventative visit and annual GYN examination.  Acute Concerns:  Diet:  Exercise:  Sexual History: Birth history:  LMP:  Birth Control:  POA/Living Will:  Social:  Social History   Social History  . Marital status: Single    Spouse name: N/A  . Number of children: N/A  . Years of education: N/A   Social History Main Topics  . Smoking status: Current Every Day Smoker    Packs/day: 0.25    Types: Cigarettes  . Smokeless tobacco: Never Used  . Alcohol use 8.4 oz/week    14 Cans of beer per week     Comment: occ per patient on 07/01/15  . Drug use: No  . Sexual activity: Not on file   Other Topics Concern  . Not on file   Social History Narrative  . No narrative on file    Immunization:  Tdap/TD:  Influenza:  Pneumococcal:  Herpes Zoster:  Cancer Screening:  Pap Smear:  Mammogram:   Colonoscopy:  Dexa:  ROS reviewed and were negative unless otherwise noted in HPI.   Physical Exam: VITALS: GEN: HEENT: CARDIAC: RESP: BREAST:Exam performed in the presence of a chaperone.  ABD: GU/GYN:Exam performed in the presence of a chaperone.  EXT: SKIN:  ASSESSMENT & PLAN: 56 y.o. female presents for annual well woman/preventative exam and GYN exam. Please see problem specific assessment and plan.    Luiz Blare, DO 09/03/2016, 10:48 AM PGY-3, [place]

## 2016-09-03 NOTE — Assessment & Plan Note (Signed)
Continue simvastatin. Will check lipid panel today since last obtained in 2014.

## 2016-09-03 NOTE — Progress Notes (Signed)
56 y.o. year old female presents for well woman/preventative visit.  Acute Concerns:  #Leg cramping  Going on now for a couple weeks Occurs when walks and at nights  Currently without pain  #HTN Discontinued taking BP medication about a month ago. Believed that she was better and didn't need to take medication anymore.   #HLD Compliant with medication. Denies any side effects.  #Hypothyroidism Discontinued taking medication. Denies any symptoms such as fatigue, weight changes. Has not followed up with any specialist since thyroid removal.  Diet: cooks most of meals but knoww she isn't eating well, a lot of starch, not a lot of fried foods  Exercise: No physical activity, too scared to exercise in neighborhood, can't afford gym membership  Sexual History: No, last encounter 5 months ago  LMP: No LMP recorded. Patient is postmenopausal. No bleeding.   POA/Living Will: Doesn't have one, would want children to make decisions   Social:  Social History   Social History  . Marital status: Single    Spouse name: N/A  . Number of children: N/A  . Years of education: N/A   Social History Main Topics  . Smoking status: Current Every Day Smoker    Packs/day: 0.25    Types: Cigarettes  . Smokeless tobacco: Never Used  . Alcohol use 0.0 oz/week     Comment: last occurence 3 weeks ago  . Drug use: No  . Sexual activity: Not Asked   Other Topics Concern  . None   Social History Narrative  . None    Health Maintenance Due  Topic Date Due  . Hepatitis C Screening  [birth year]  . MAMMOGRAM  05/17/2009  . COLONOSCOPY  05/17/2009  . INFLUENZA VACCINE  03/27/2016    ROS reviewed and were negative unless otherwise noted in HPI.   Physical Exam: BP 130/90 (BP Location: Right Arm, Patient Position: Sitting, Cuff Size: Normal)   Pulse 75   Temp 97.8 F (36.6 C)   Ht 5\' 1"  (1.549 m)   Wt 153 lb (69.4 kg)   SpO2 96%   BMI 28.91 kg/m   Physical Exam  Constitutional: She  is oriented to person, place, and time and well-developed, well-nourished, and in no distress.  HENT:  Head: Normocephalic and atraumatic.  Mouth/Throat: Oropharynx is clear and moist.  Eyes: Conjunctivae are normal. Pupils are equal, round, and reactive to light.  Neck: Normal range of motion. Neck supple.  Cardiovascular: Normal rate, regular rhythm and normal heart sounds.   Pulmonary/Chest: Effort normal and breath sounds normal. She has no wheezes. She has no rales.  Abdominal: Soft. Bowel sounds are normal. She exhibits distension. There is no tenderness. There is no guarding.  Musculoskeletal: Normal range of motion. She exhibits no edema, tenderness or deformity.  No calf tenderness on palpation  Neurological: She is alert and oriented to person, place, and time. She has normal strength. No cranial nerve deficit. She exhibits normal muscle tone.  Skin: Skin is warm and dry. No rash noted.  Psychiatric: Mood and affect normal.    ASSESSMENT & PLAN: 56 y.o. female presents for annual well woman/preventative exam. Please see problem specific assessment and plan.     Meds ordered this encounter  Medications  . levothyroxine (SYNTHROID, LEVOTHROID) 112 MCG tablet    Sig: Take 1 tablet (112 mcg total) by mouth daily.    Dispense:  30 tablet    Refill:  0    Orders Placed This Encounter  Procedures  . Flu  Vaccine QUAD 36+ mos IM  . COMPLETE METABOLIC PANEL WITH GFR  . CBC  . Hepatitis C antibody  . TSH  . Lipid panel  . Ambulatory referral to Gastroenterology    Referral Priority:   Routine    Referral Type:   Consultation    Referral Reason:   Specialty Services Required    Number of Visits Requested:   [place], DO 09/03/2016, 1:48 PM PGY-3, [place]

## 2016-09-03 NOTE — Patient Instructions (Signed)
Things to do to Keep yourself Healthy - Exercise at least 30-45 minutes a day,  3-4 days a week.  - Eat a low-fat diet with lots of fruits and vegetables, up to 7-9 servings per day. - Seatbelts can save your life. Wear them always. - Smoke detectors on every level of your home, check batteries every year. - Eye Doctor - have an eye exam every 1-2 years - Safe sex - if you may be exposed to STDs, use a condom. - Alcohol If you drink, do it moderately,less than 2 drinks per day. - [place].  Choose someone to speak for you if you are not able. - Depression is common in our stressful world.If you're feeling down or losing interest in things you normally enjoy, please come in for a visit. - Violence - If anyone is threatening or hurting you, please call immediately.   Restarted your thyroid medicine. Need to take this!! Will call about blood work and then will decide if need to restart same BP medication Call to schedule mammogram Colonoscopy ordered

## 2016-09-03 NOTE — Assessment & Plan Note (Signed)
Unknown etiology at this time. Differential is broad including PVD(pulses palpable), neuropathic(no DM or other reason to have neuropathic pain), electrolyte disturbances, restless leg syndrome, thyroid dysfunction, idiopathic, etc. 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 Will also check electrolytes and treat as needed. Patient to follow-up in 4 weeks if cramping continues after initial interventions. Would continue to work up other diagnoses at that time.

## 2016-09-03 NOTE — Assessment & Plan Note (Signed)
Patient self-discontinued medication. BP mildly elevated. Will check patient labs and then restart medication. May need to switch from HCTZ to a CCB or ACEi due to muscle cramping patient is having. Discussed importance of maintaining good BP control for overall health.

## 2016-09-03 NOTE — Assessment & Plan Note (Signed)
S/p thyroidectomy for goiter. Has not followed up with ENT or endo since removal. Self discontinued taking medication. Denies current symptoms.  Will restart levothyroxine today at 1.65mcg/kg. Will adjust medication as needed based off of TSH collected today. Counseled extensively on importance of taking medication with patient.

## 2016-09-04 ENCOUNTER — Encounter: Payer: Self-pay | Admitting: Obstetrics and Gynecology

## 2016-09-04 ENCOUNTER — Other Ambulatory Visit: Payer: Self-pay | Admitting: Family Medicine

## 2016-09-04 ENCOUNTER — Telehealth: Payer: Self-pay | Admitting: Obstetrics and Gynecology

## 2016-09-04 DIAGNOSIS — Z1231 Encounter for screening mammogram for malignant neoplasm of breast: Secondary | ICD-10-CM

## 2016-09-04 LAB — LIPID PANEL
Cholesterol: 332 mg/dL — ABNORMAL HIGH (ref <200)
HDL: 88 mg/dL (ref >50)
LDL CALC: 201 mg/dL — AB (ref <100)
Total CHOL/HDL Ratio: 3.8 Ratio (ref <5.0)
Triglycerides: 217 mg/dL — ABNORMAL HIGH (ref <150)
VLDL: 43 mg/dL — ABNORMAL HIGH (ref <30)

## 2016-09-04 LAB — COMPLETE METABOLIC PANEL WITH GFR
ALBUMIN: 4.5 g/dL (ref 3.6–5.1)
ALK PHOS: 34 U/L (ref 33–130)
ALT: 33 U/L — ABNORMAL HIGH (ref 6–29)
AST: 25 U/L (ref 10–35)
BILIRUBIN TOTAL: 0.4 mg/dL (ref 0.2–1.2)
BUN: 15 mg/dL (ref 7–25)
CALCIUM: 9.1 mg/dL (ref 8.6–10.4)
CO2: 27 mmol/L (ref 20–31)
Chloride: 102 mmol/L (ref 98–110)
Creat: 1.21 mg/dL — ABNORMAL HIGH (ref 0.50–1.05)
GFR, EST NON AFRICAN AMERICAN: 50 mL/min — AB (ref >=60)
GFR, Est African American: 57 mL/min — ABNORMAL LOW (ref >=60)
GLUCOSE: 87 mg/dL (ref 65–99)
POTASSIUM: 4.2 mmol/L (ref 3.5–5.3)
Sodium: 140 mmol/L (ref 135–146)
TOTAL PROTEIN: 7.2 g/dL (ref 6.1–8.1)

## 2016-09-04 LAB — HEPATITIS C ANTIBODY: HCV AB: NEGATIVE

## 2016-09-04 NOTE — Telephone Encounter (Signed)
Spoke with pt and informed her to continue taking her thyroid and cholesterol medications and that Dr. Gerarda Fraction sent her in some BP medication. Pt agreed with plan.

## 2016-09-04 NOTE — Telephone Encounter (Signed)
Pt states she was speaking with Dr. Pete Pelt nurse and was disconnected. Please call back [phone] ep

## 2016-09-05 ENCOUNTER — Encounter: Payer: Self-pay | Admitting: Gastroenterology

## 2016-09-06 ENCOUNTER — Ambulatory Visit
Admission: RE | Admit: 2016-09-06 | Discharge: 2016-09-06 | Disposition: A | Discharge status: Home / Self Care | Payer: Medicare Other | Source: Ambulatory Visit | Attending: Family Medicine | Admitting: Family Medicine

## 2016-09-06 DIAGNOSIS — Z1231 Encounter for screening mammogram for malignant neoplasm of breast: Secondary | ICD-10-CM | POA: Diagnosis not present

## 2016-09-07 ENCOUNTER — Other Ambulatory Visit: Payer: Self-pay | Admitting: Family Medicine

## 2016-09-07 DIAGNOSIS — R928 Other abnormal and inconclusive findings on diagnostic imaging of breast: Secondary | ICD-10-CM

## 2016-09-10 ENCOUNTER — Telehealth: Payer: Self-pay

## 2016-09-10 NOTE — Telephone Encounter (Signed)
-----   Message from Kinnie Feil, MD sent at 09/08/2016  9:57 AM EST ----- Regarding: Urgent/2nd request Hello blue team,  Please contact patient to schedule earliest available appointment with her PCP/ Dr. Gerarda Fraction to discuss mammogram report. Thanks.  ----- Message ----- From: Kinnie Feil, MD Sent: 09/06/2016   2:39 PM To: [facility]  Please contact patient to schedule earliest available appointment with her PCP to discuss mammogram report. Thanks.

## 2016-09-10 NOTE — Telephone Encounter (Signed)
Pt scheduled for apt 1/16 at 2pm with Gerarda Fraction.

## 2016-09-11 ENCOUNTER — Other Ambulatory Visit: Payer: Self-pay | Admitting: Family Medicine

## 2016-09-11 ENCOUNTER — Ambulatory Visit: Payer: Medicare Other | Admitting: Obstetrics and Gynecology

## 2016-09-11 ENCOUNTER — Other Ambulatory Visit: Payer: Self-pay

## 2016-09-11 DIAGNOSIS — R928 Other abnormal and inconclusive findings on diagnostic imaging of breast: Secondary | ICD-10-CM

## 2016-09-12 ENCOUNTER — Inpatient Hospital Stay: Admission: RE | Admit: 2016-09-12 | Payer: Medicare Other | Source: Ambulatory Visit

## 2016-09-14 ENCOUNTER — Ambulatory Visit
Admission: RE | Admit: 2016-09-14 | Discharge: 2016-09-14 | Disposition: A | Discharge status: Home / Self Care | Payer: Medicare Other | Source: Ambulatory Visit | Attending: Family Medicine | Admitting: Family Medicine

## 2016-09-14 DIAGNOSIS — R928 Other abnormal and inconclusive findings on diagnostic imaging of breast: Secondary | ICD-10-CM

## 2016-09-14 DIAGNOSIS — N6311 Unspecified lump in the right breast, upper outer quadrant: Secondary | ICD-10-CM | POA: Diagnosis not present

## 2016-09-17 ENCOUNTER — Other Ambulatory Visit: Payer: Medicare Other

## 2016-10-25 ENCOUNTER — Encounter: Payer: Self-pay | Admitting: Obstetrics and Gynecology

## 2016-11-06 ENCOUNTER — Other Ambulatory Visit: Payer: Self-pay | Admitting: Obstetrics and Gynecology

## 2016-11-07 ENCOUNTER — Encounter: Payer: Medicare Other | Admitting: Gastroenterology

## 2017-03-11 ENCOUNTER — Other Ambulatory Visit: Payer: Self-pay | Admitting: Obstetrics and Gynecology

## 2017-03-11 NOTE — Telephone Encounter (Signed)
Please have patient make a follow up appointment.

## 2017-03-12 NOTE — Telephone Encounter (Signed)
Pt contacted and apt scheduled for 7/23 with pcp for FU.

## 2017-03-15 NOTE — Progress Notes (Signed)
   [facility] Phone: [phone]   Date of Visit: 03/18/2017   HPI:  Here for follow up.  Hypothyroidism:  - last seen in clinic in Jan 2018 with TSH 131. Patient was not taking synthroid at that time. She was restarted on Synthroid 137mcg daily.  - patient reports that she has been taking medication daily as prescribed.  - she does report of constipation which has been chronic, dry skin, and hair loss.  - reports her mood is overall okay. Reports she sometimes feels bad. No SI or HI  HTN:  - medications: HCTZ 12.5mg  daily  - denies HA, chest pain, shortness of breath, blurred vision - compliant with medication  HLD: - Medication: Simvastatin 40mg  daily - denies myalgias or RUQ pain - compliant with medication  Tobacco Use:  - reports she has not tried to quit before - reports that the patches seem expensive - she is not sure if she is ready to quit - she would like to get information about Chantix  Constipation:  - this is a chronic issue - she does use over the counter stool softner which does help but she does not think she should be using this often  - no blood in stool - reports she drinks 10 0.5 gallon bottles a day - reports she does not eat a high fiber diet  - denies fevers, chills, night sweats, unintentional weight loss.   ROS: See HPI.  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   PHYSICAL EXAM: BP 126/78   Pulse 74   Temp 98 F (36.7 C) (Oral)   Ht 5\' 1"  (1.549 m)   Wt 149 lb 9.6 oz (67.9 kg)   SpO2 98%   BMI 28.27 kg/m  GEN: NAD HEENT:  neck supple, EOMI, sclera clear CV: RRR, no murmurs, rubs, or gallops PULM: CTAB, normal effort ABD: Soft, nontender, nondistended, NABS, no organomegaly SKIN: No rash or cyanosis; warm and well-perfused EXTR: No lower extremity edema or calf tenderness PSYCH: Mood and affect euthymic, normal rate and volume of speech NEURO: Awake, alert, no focal  deficits grossly, normal speech  ASSESSMENT/PLAN:  Health maintenance:  - re-referred to GI for colonoscopy (no showed last visit)   Hypertension, benign BP at goal. Continue HCTZ 12.5mg  daily. CMP today   Hypothyroidism Patient has been on synthroid since Jan. Will repeat TSH today.   Hyperlipidemia Lipid panel completed earlier this year with ASCVD risk of 10.9%. Continue Simvastatin. Will also benefit from daily ASA 81 mg for primary prevention. Will call patient to discuss.   TOBACCO USER Contemplating quitting. Is interested in learning more about Chantix (information given). Currently, CT lung exam not indicated as patient has had 10.5 pack years.   Constipation:  Likely multifactorial. Discussed diet changes to improve this. No red flags.    Smiley Houseman, MD PGY [place]

## 2017-03-18 ENCOUNTER — Encounter: Payer: Self-pay | Admitting: Internal Medicine

## 2017-03-18 ENCOUNTER — Ambulatory Visit (INDEPENDENT_AMBULATORY_CARE_PROVIDER_SITE_OTHER): Payer: Medicare Other | Admitting: Internal Medicine

## 2017-03-18 VITALS — BP 126/78 | HR 74 | Temp 98.0°F | Ht 61.0 in | Wt 149.6 lb

## 2017-03-18 DIAGNOSIS — K5900 Constipation, unspecified: Secondary | ICD-10-CM | POA: Diagnosis not present

## 2017-03-18 DIAGNOSIS — I159 Secondary hypertension, unspecified: Secondary | ICD-10-CM

## 2017-03-18 DIAGNOSIS — Z1211 Encounter for screening for malignant neoplasm of colon: Secondary | ICD-10-CM

## 2017-03-18 DIAGNOSIS — F17200 Nicotine dependence, unspecified, uncomplicated: Secondary | ICD-10-CM | POA: Diagnosis not present

## 2017-03-18 DIAGNOSIS — E785 Hyperlipidemia, unspecified: Secondary | ICD-10-CM | POA: Diagnosis not present

## 2017-03-18 DIAGNOSIS — E039 Hypothyroidism, unspecified: Secondary | ICD-10-CM

## 2017-03-18 MED ORDER — LEVOTHYROXINE SODIUM 112 MCG PO TABS: 112.0000 ug | ORAL_TABLET | Freq: Every day | ORAL | 0 refills | Status: DC

## 2017-03-18 NOTE — Assessment & Plan Note (Signed)
BP at goal. Continue HCTZ 12.5mg  daily. CMP today

## 2017-03-18 NOTE — Assessment & Plan Note (Signed)
Patient has been on synthroid since Jan. Will repeat TSH today.

## 2017-03-18 NOTE — Assessment & Plan Note (Addendum)
Lipid panel completed earlier this year with ASCVD risk of 10.9%. Continue Simvastatin. Will also benefit from daily ASA 81 mg for primary prevention. Will call patient to discuss.

## 2017-03-18 NOTE — Patient Instructions (Addendum)
I will get labs today.  I made a referral for your colonoscopy    Varenicline oral tablets What is this medicine? VARENICLINE (var EN i kleen) is used to help people quit smoking. It can reduce the symptoms caused by stopping smoking. It is used with a patient support program recommended by your physician. This medicine may be used for other purposes; ask your health care provider or pharmacist if you have questions. COMMON BRAND NAME(S): Chantix What should I tell my health care provider before I take this medicine? They need to know if you have any of these conditions: -bipolar disorder, depression, schizophrenia or other mental illness -heart disease -if you often drink alcohol -kidney disease -peripheral vascular disease -seizures -stroke -suicidal thoughts, plans, or attempt; a previous suicide attempt by you or a family member -an unusual or allergic reaction to varenicline, other medicines, foods, dyes, or preservatives -pregnant or trying to get pregnant -breast-feeding How should I use this medicine? Take this medicine by mouth after eating. Take with a full glass of water. Follow the directions on the prescription label. Take your doses at regular intervals. Do not take your medicine more often than directed. There are 3 ways you can use this medicine to help you quit smoking; talk to your health care professional to decide which plan is right for you: 1) you can choose a quit date and start this medicine 1 week before the quit date, or, 2) you can start taking this medicine before you choose a quit date, and then pick a quit date between day 8 and 35 days of treatment, or, 3) if you are not sure that you are able or willing to quit smoking right away, start taking this medicine and slowly decrease the amount you smoke as directed by your health care professional with the goal of being cigarette-free by week 12 of treatment. Stick to your plan; ask about support groups or other  ways to help you remain cigarette-free. If you are motivated to quit smoking and did not succeed during a previous attempt with this medicine for reasons other than side effects, or if you returned to smoking after this treatment, speak with your health care professional about whether another course of this medicine may be right for you. A special MedGuide will be given to you by the pharmacist with each prescription and refill. Be sure to read this information carefully each time. Talk to your pediatrician regarding the use of this medicine in children. This medicine is not approved for use in children. Overdosage: If you think you have taken too much of this medicine contact a poison control center or emergency room at once. NOTE: This medicine is only for you. Do not share this medicine with others. What if I miss a dose? If you miss a dose, take it as soon as you can. If it is almost time for your next dose, take only that dose. Do not take double or extra doses. What may interact with this medicine? -alcohol or any product that contains alcohol -insulin -other stop smoking aids -theophylline -warfarin This list may not describe all possible interactions. Give your health care provider a list of all the medicines, herbs, non-prescription drugs, or dietary supplements you use. Also tell them if you smoke, drink alcohol, or use illegal drugs. Some items may interact with your medicine. What should I watch for while using this medicine? Visit your doctor or health care professional for regular check ups. Ask for ongoing advice  and encouragement from your doctor or healthcare professional, friends, and family to help you quit. If you smoke while on this medication, quit again Your mouth may get dry. Chewing sugarless gum or sucking hard candy, and drinking plenty of water may help. Contact your doctor if the problem does not go away or is severe. You may get drowsy or dizzy. Do not drive, use  machinery, or do anything that needs mental alertness until you know how this medicine affects you. Do not stand or sit up quickly, especially if you are an older patient. This reduces the risk of dizzy or fainting spells. Sleepwalking can happen during treatment with this medicine, and can sometimes lead to behavior that is harmful to you, other people, or property. Stop taking this medicine and tell your doctor if you start sleepwalking or have other unusual sleep-related activity. Decrease the amount of alcoholic beverages that you drink during treatment with this medicine until you know if this medicine affects your ability to tolerate alcohol. Some people have experienced increased drunkenness (intoxication), unusual or sometimes aggressive behavior, or no memory of things that have happened (amnesia) during treatment with this medicine. The use of this medicine may increase the chance of suicidal thoughts or actions. Pay special attention to how you are responding while on this medicine. Any worsening of mood, or thoughts of suicide or dying should be reported to your health care professional right away. What side effects may I notice from receiving this medicine? Side effects that you should report to your doctor or health care professional as soon as possible: -allergic reactions like skin rash, itching or hives, swelling of the face, lips, tongue, or throat -acting aggressive, being angry or violent, or acting on dangerous impulses -breathing problems -changes in vision -chest pain or chest tightness -confusion, trouble speaking or understanding -new or worsening depression, anxiety, or panic attacks -extreme increase in activity and talking (mania) -fast, irregular heartbeat -feeling faint or lightheaded, falls -fever -pain in legs when walking -problems with balance, talking, walking -redness, blistering, peeling or loosening of the skin, including inside the mouth -ringing in  ears -seeing or hearing things that aren't there (hallucinations) -seizures -sleepwalking -sudden numbness or weakness of the face, arm or leg -thoughts about suicide or dying, or attempts to commit suicide -trouble passing urine or change in the amount of urine -unusual bleeding or bruising -unusually weak or tired Side effects that usually do not require medical attention (report to your doctor or health care professional if they continue or are bothersome): -constipation -headache -nausea, vomiting -strange dreams -stomach gas -trouble sleeping This list may not describe all possible side effects. Call your doctor for medical advice about side effects. You may report side effects to FDA at 1-800-FDA-1088. Where should I keep my medicine? Keep out of the reach of children. Store at room temperature between 15 and 30 degrees C (59 and 86 degrees F). Throw away any unused medicine after the expiration date. NOTE: This sheet is a summary. It may not cover all possible information. If you have questions about this medicine, talk to your doctor, pharmacist, or health care provider.  2018 Elsevier/Gold Standard (2015-04-28 16:14:23)

## 2017-03-18 NOTE — Assessment & Plan Note (Signed)
Contemplating quitting. Is interested in learning more about Chantix (information given). Currently, CT lung exam not indicated as patient has had 10.5 pack years.

## 2017-03-19 LAB — COMPREHENSIVE METABOLIC PANEL
A/G RATIO: 1.9 (ref 1.2–2.2)
ALK PHOS: 51 IU/L (ref 39–117)
ALT: 29 IU/L (ref 0–32)
AST: 23 IU/L (ref 0–40)
Albumin: 5 g/dL (ref 3.5–5.5)
BUN / CREAT RATIO: 13 (ref 9–23)
BUN: 18 mg/dL (ref 6–24)
Bilirubin Total: 0.4 mg/dL (ref 0.0–1.2)
CALCIUM: 10.2 mg/dL (ref 8.7–10.2)
CO2: 29 mmol/L (ref 20–29)
Chloride: 97 mmol/L (ref 96–106)
Creatinine, Ser: 1.34 mg/dL — ABNORMAL HIGH (ref 0.57–1.00)
GFR calc Af Amer: 51 mL/min/{1.73_m2} — ABNORMAL LOW (ref >59)
GFR calc non Af Amer: 44 mL/min/{1.73_m2} — ABNORMAL LOW (ref >59)
GLOBULIN, TOTAL: 2.7 g/dL (ref 1.5–4.5)
Glucose: 83 mg/dL (ref 65–99)
POTASSIUM: 4.1 mmol/L (ref 3.5–5.2)
SODIUM: 140 mmol/L (ref 134–144)
Total Protein: 7.7 g/dL (ref 6.0–8.5)

## 2017-03-19 LAB — TSH: TSH: 63.01 u[IU]/mL — ABNORMAL HIGH (ref 0.450–4.500)

## 2017-03-20 ENCOUNTER — Telehealth: Payer: Self-pay | Admitting: Internal Medicine

## 2017-03-20 DIAGNOSIS — N183 Chronic kidney disease, stage 3 unspecified: Secondary | ICD-10-CM

## 2017-03-20 MED ORDER — LEVOTHYROXINE SODIUM 125 MCG PO TABS: 125.0000 ug | ORAL_TABLET | Freq: Every day | ORAL | 1 refills | Status: DC

## 2017-03-20 MED ORDER — ASPIRIN EC 81 MG PO TBEC: 81.0000 mg | DELAYED_RELEASE_TABLET | Freq: Every day | ORAL | 0 refills | Status: AC

## 2017-03-20 NOTE — Telephone Encounter (Signed)
Called patient to discuss lab results. Discussed TSH results. Will increase from 112 to 142mcg daily. Follow up ini 6 weeks for repeat. Also discussed indication for ASA81mg  daily. Patient does not have contraindications. Patient agrees to take this. Both Rx sent to pharmacy. We also talked about elevated Cr at least for the past 6 months. Her HTN has been well controlled. Does have history of tobacco use. Will get UA to further evaluate. Patient knows to make a lab visit for this.

## 2017-03-21 ENCOUNTER — Encounter: Payer: Self-pay | Admitting: Internal Medicine

## 2017-03-22 ENCOUNTER — Telehealth: Payer: Self-pay | Admitting: *Deleted

## 2017-03-22 NOTE — Telephone Encounter (Signed)
Late entry: Patient called 03/21/2017 needing clarification on her levothyroxine.  Patient stated that the pharmacy had two different dosing. Reviewed patient's chart and her PCP recently increased her dosage on 03/20/17 due to increase TSH level. Levothyroxine was increased from 112 mcg daily to 125 mcg daily.  Advised patient to pick up the levothyroxine 125 mcg daily.  Patient voiced understanding.  Patient informed nurse that she wanted a new doctor because she felt the doctor was given her the wrong information.  Advised patient that she needed to speak with our Environmental education officer. Patient was transferred to [facility], [employer].  Derl Barrow, RN

## 2017-03-22 NOTE — Telephone Encounter (Signed)
03/22/17 I spoke with [name] and verified she had the correct prescription. We discussed that Dr.Gunadasa did cancel the previous prescription in the computer on 7/25 and on 7/25 she sent a new prescription to the pharmacy for her Levothyroxine.[name] confirmed she was ok with the above and with Dr.Gunadasa. Brooke Rivera.

## 2017-03-25 IMAGING — MG 2D DIGITAL DIAGNOSTIC UNILATERAL RIGHT MAMMOGRAM WITH CAD AND AD
6 series · 8 of 14 positions shown · non-contrast
Comparison: Baseline mammogram 09/06/2016.

CLINICAL DATA: Recall from baseline 2D screening mammography,
possible masses in the upper outer right breast at posterior depth.

EXAM:
2D DIGITAL DIAGNOSTIC RIGHT MAMMOGRAM WITH CAD AND ADJUNCT TOMO
ULTRASOUND RIGHT BREAST

[R MLO synth-2D]
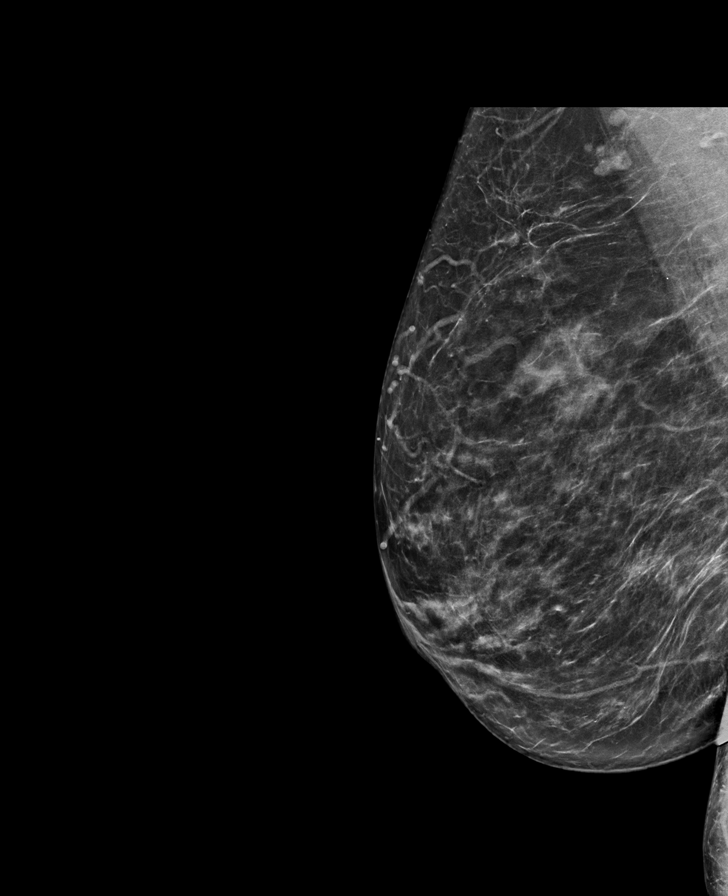

[R CC synth-2D]
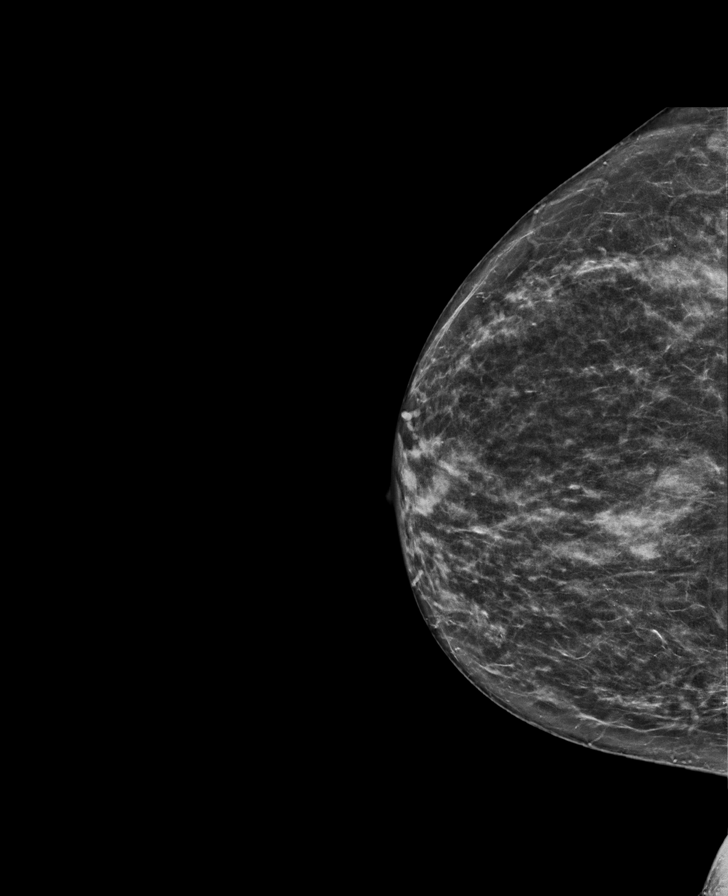

[R MLO]
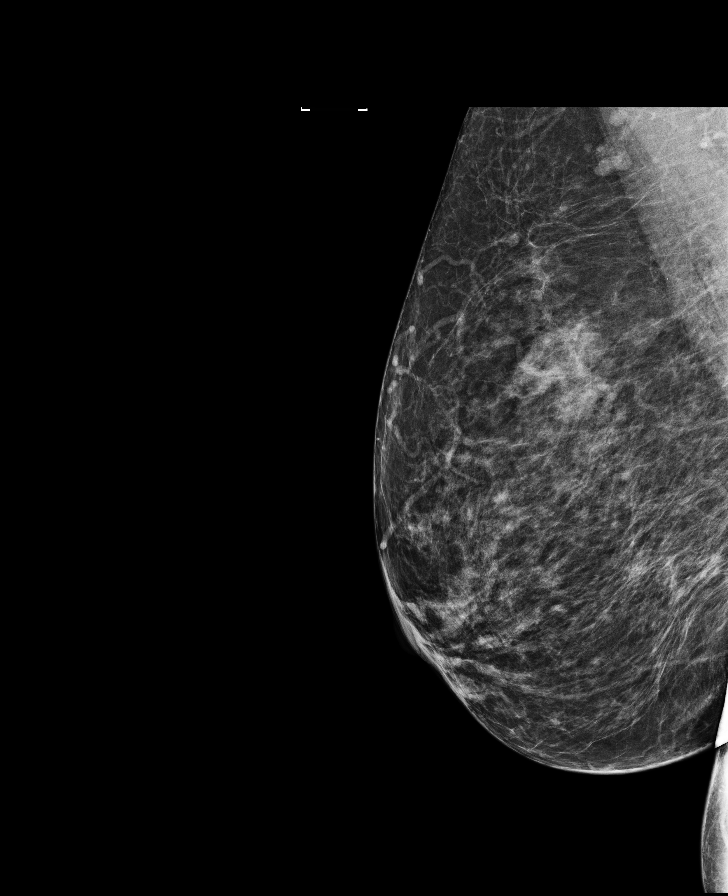

[R CC]
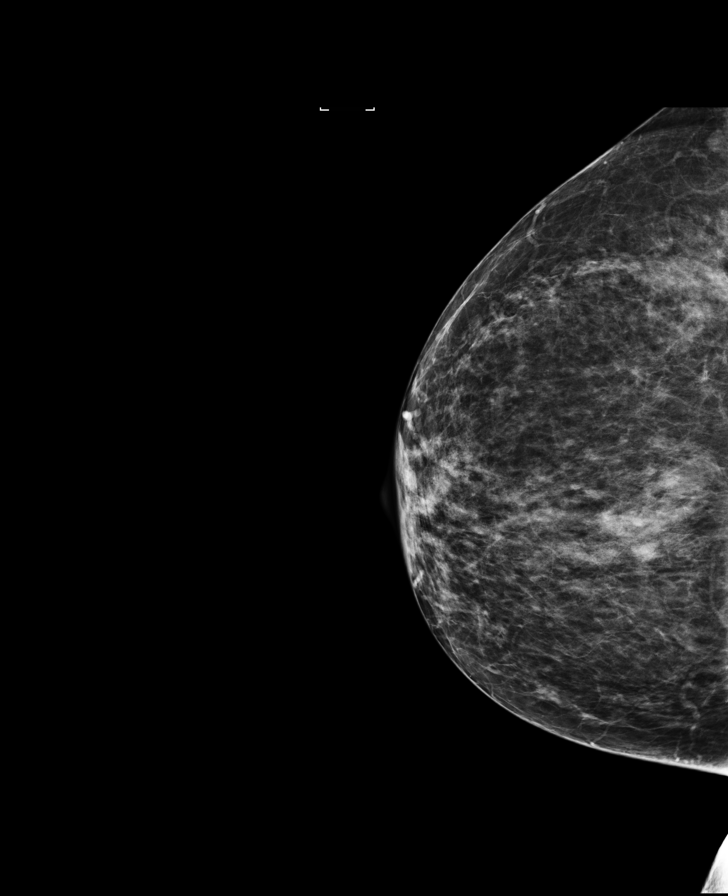

[R CC tomo · 3 of 67 frames shown]
[frame 22/67]
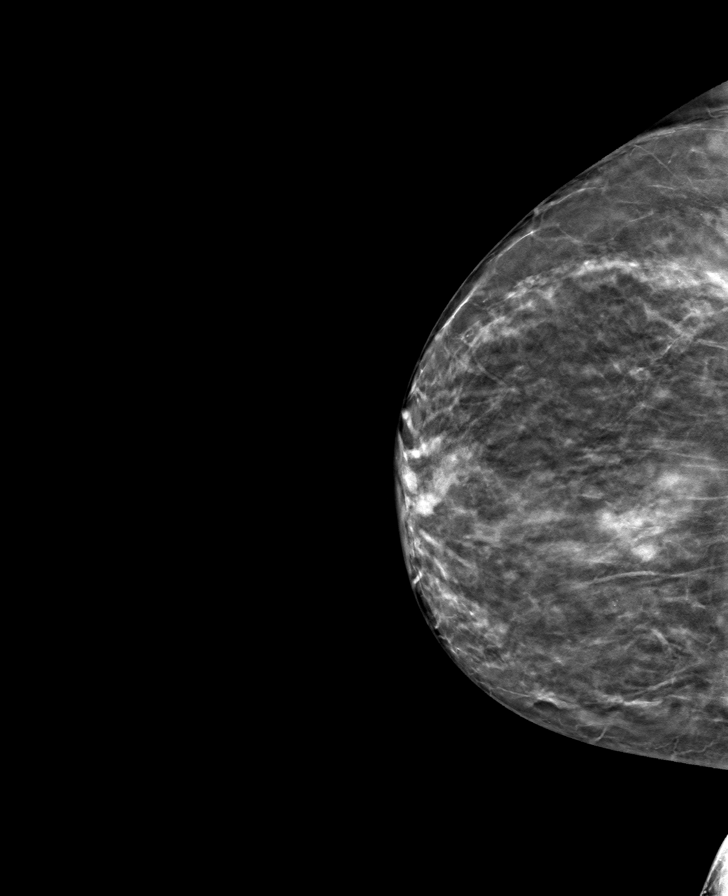
[frame 34/67]
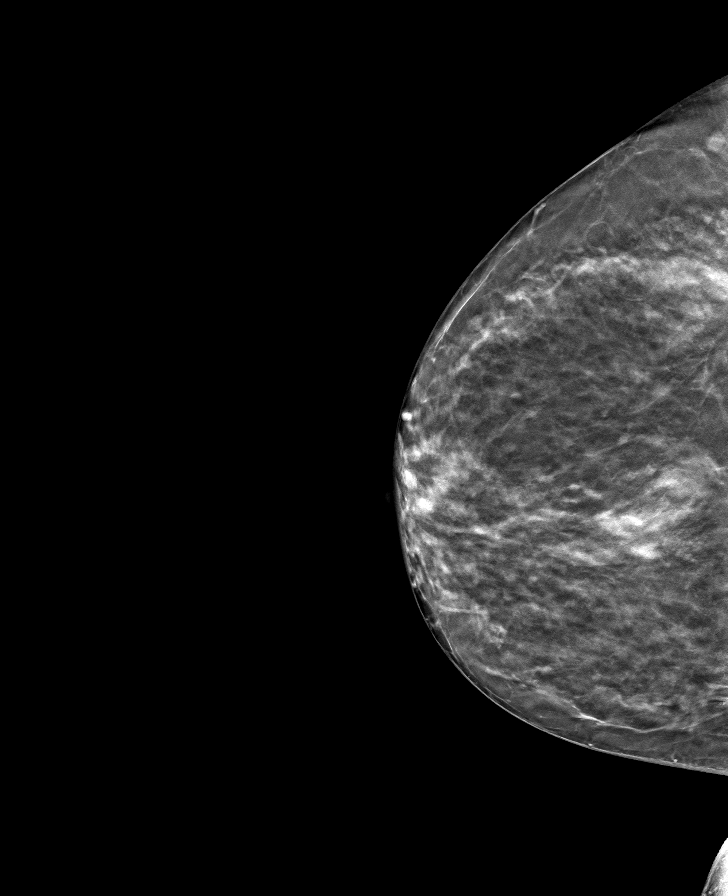
[frame 46/67]
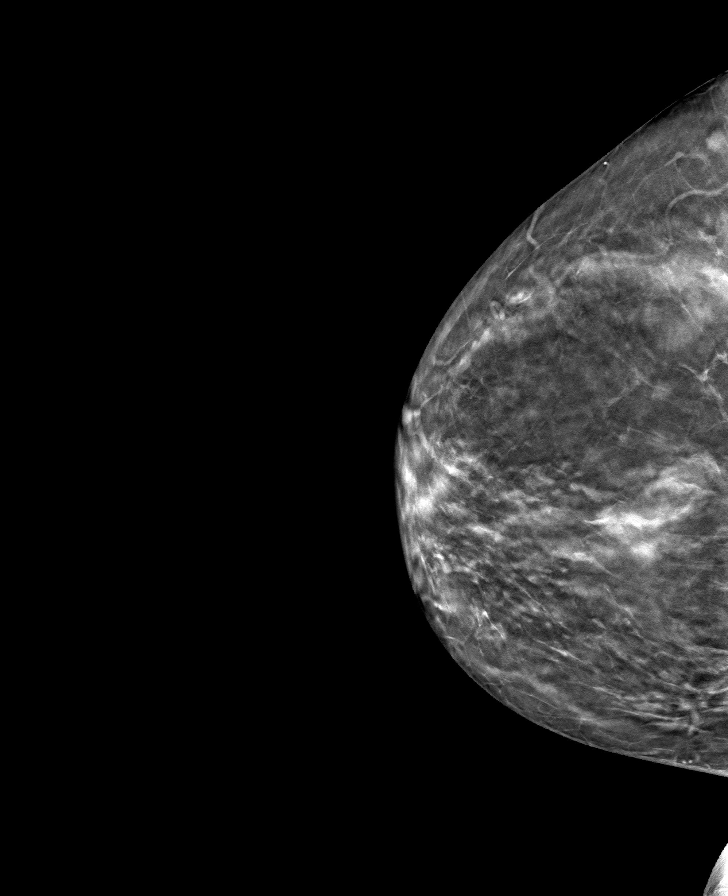

[R MLO tomo · tomo slice 39/77.0]
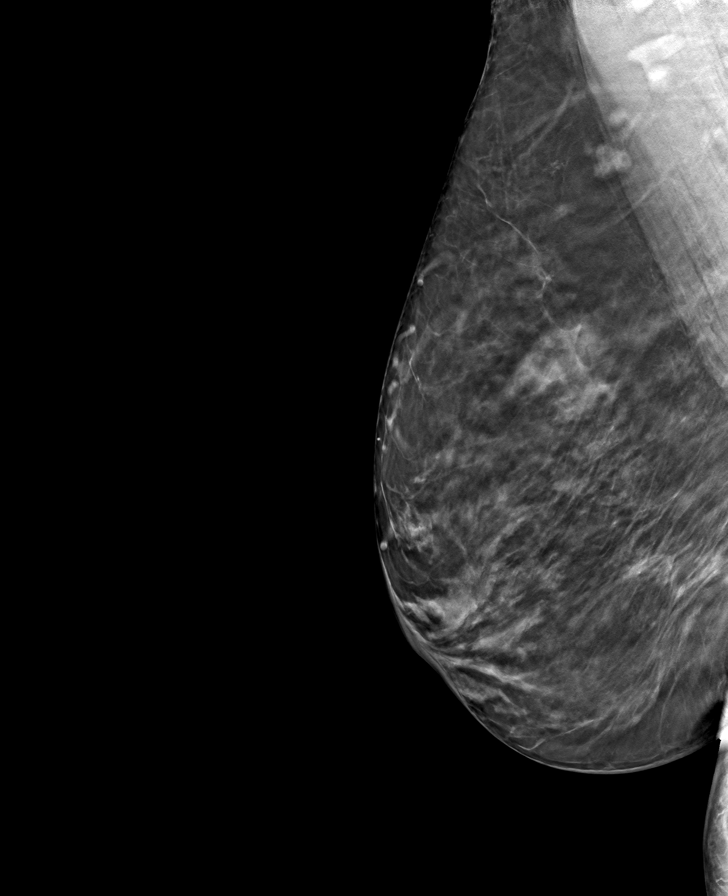

[8 of 14 positions shown; findings below may reference images not displayed]

No prior ultrasound.

ACR Breast Density Category b: There are scattered areas of
fibroglandular density.
FINDINGS: Standard 2D and tomosynthesis full field CC and MLO views of the
right breast were obtained.

The masses in the upper outer quadrant of the right breast at
posterior depth both have central fat and therefore likely represent
normal intramammary lymph nodes. There is no associated
architectural distortion or suspicious calcification. No suspicious
findings elsewhere in the right breast.

Mammographic images were processed with CAD.

On physical exam, there is no palpable abnormality in the upper
outer right breast.

Targeted right breast ultrasound is performed, showing an oval
hypoechoic mass with equal acoustic characteristics and no internal
power Doppler flow at the 10 o'clock position approximately 10 cm
from the nipple measuring approximately 3 x 5 x 6 mm, possibly
containing microcalcifications. This likely corresponds to the
smaller area of concern on screening mammography.

At the 9:30 o'clock position approximately 11 cm from the nipple is
a normal-appearing intramammary lymph node measuring 2 x 3 x 3 mm,
with a normal vessel entering the central fatty hilus.

In the low right axilla is an approximate 11 mm normal-appearing
lymph node with a normal cortical thickness of 3 mm. This
corresponds to the larger area of concern on screening mammography.
IMPRESSION: 1. Likely benign 6 mm fibroadenoma in the upper outer quadrant of
the right breast at posterior depth (10 o'clock, 10 cm from the
nipple).
2. 3 mm normal appearing intramammary lymph node in the upper outer
quadrant of the right breast (9:30 o'clock, 11 cm from the nipple).
3. Normal appearing lymph node in the low right axilla.

RECOMMENDATION:
We discussed management options for the likely benign fibroadenoma
including excision, ultrasound-guided core biopsy, and short term
interval follow-up. Follow-up right breast ultrasound is recommended
at 6, 12 and 24 months to assess stability. The patient concurs with
this plan.

I have discussed the findings and recommendations with the patient.
Results were also provided in writing at the conclusion of the
visit. If applicable, a reminder letter will be sent to the patient
regarding the next appointment.

BI-RADS CATEGORY  3: Probably benign.

## 2017-03-25 IMAGING — US ULTRASOUND RIGHT BREAST LIMITED
1 series · 13 of 17 positions shown · non-contrast
Comparison: Baseline mammogram 09/06/2016.

CLINICAL DATA: Recall from baseline 2D screening mammography,
possible masses in the upper outer right breast at posterior depth.

EXAM:
2D DIGITAL DIAGNOSTIC RIGHT MAMMOGRAM WITH CAD AND ADJUNCT TOMO
ULTRASOUND RIGHT BREAST

[Series 1: ultrasound right breast limited · 0.05mm/px · 13 of 17 slices shown]
[im 1/17]
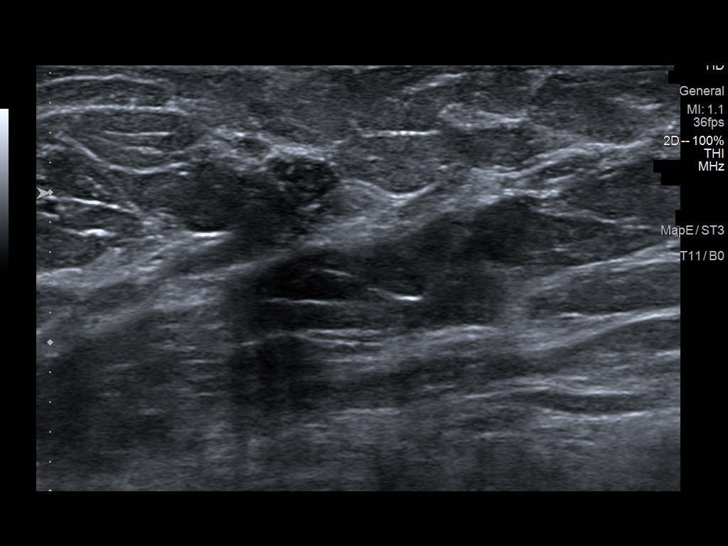
[im 2/17]
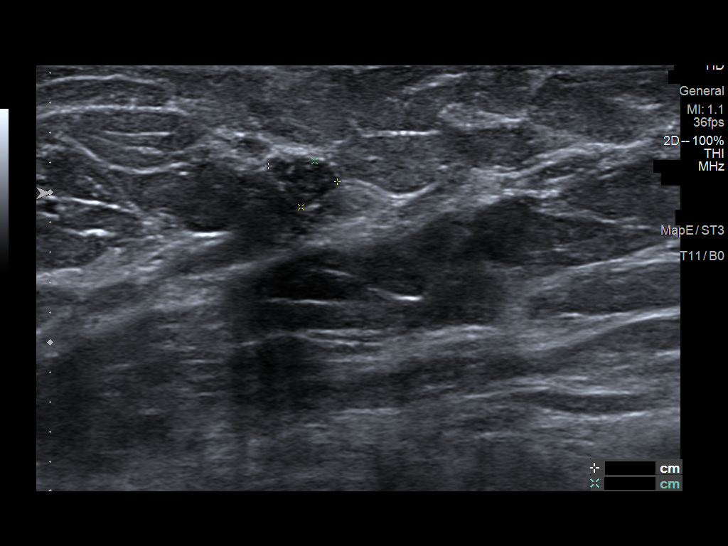
[im 4/17]
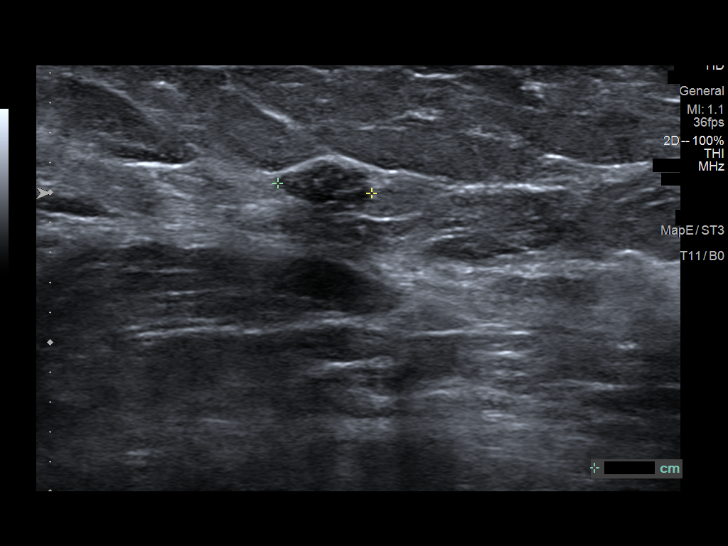
[im 5/17]
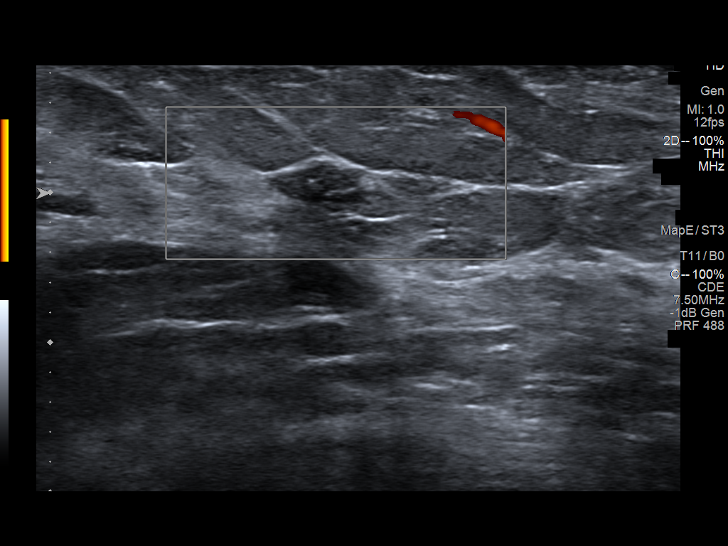
[im 6/17]
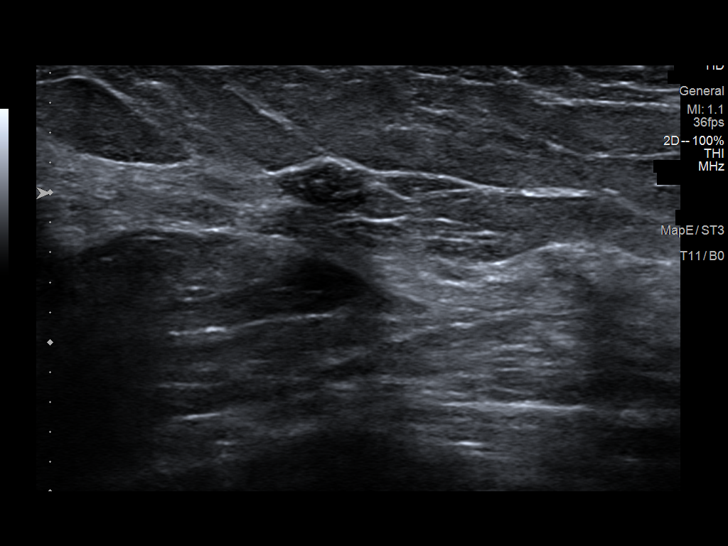
[im 8/17]
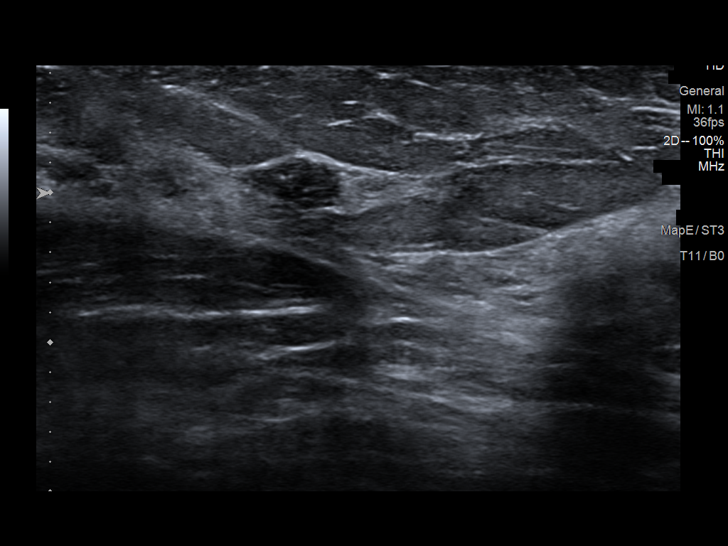
[im 9/17]
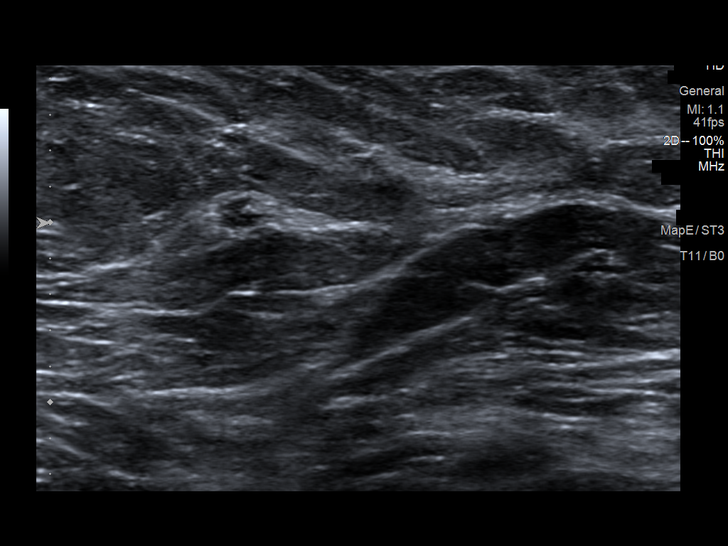
[im 10/17]
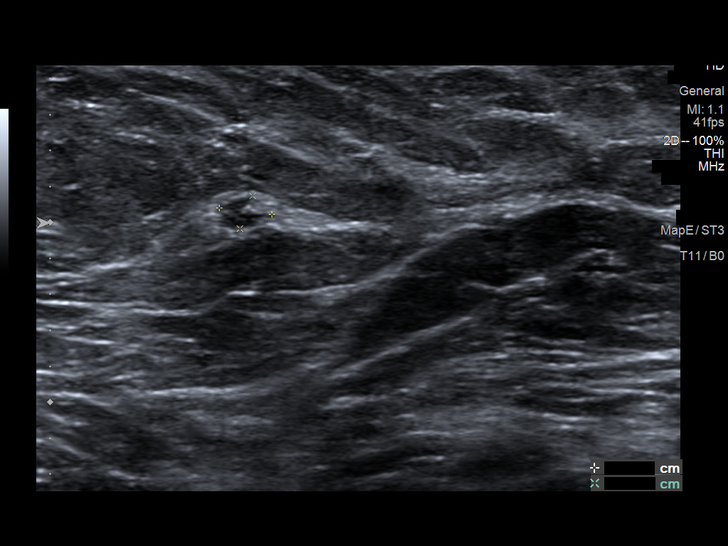
[im 12/17]
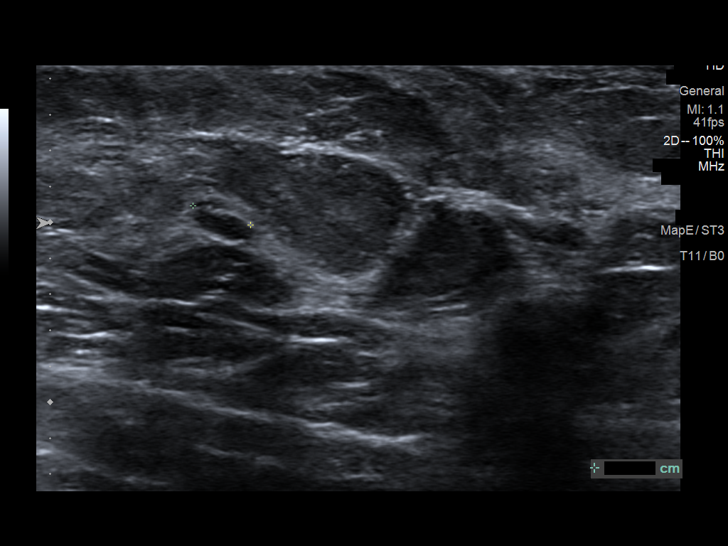
[im 13/17]
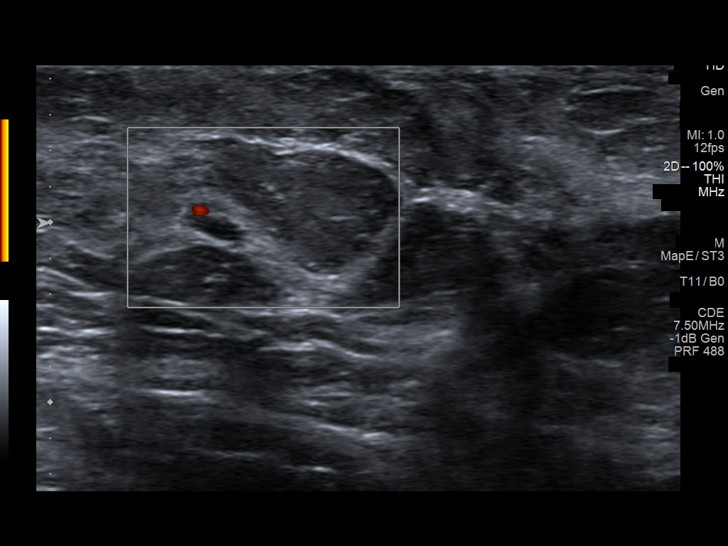
[im 14/17]
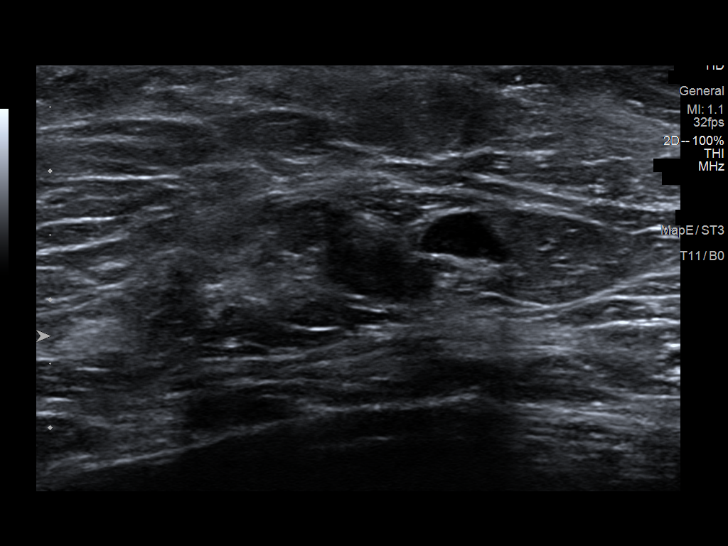
[im 16/17]
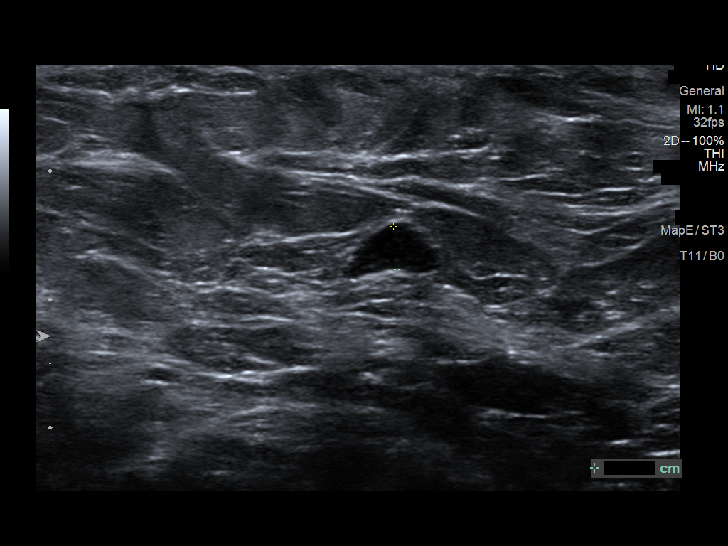
[im 17/17]
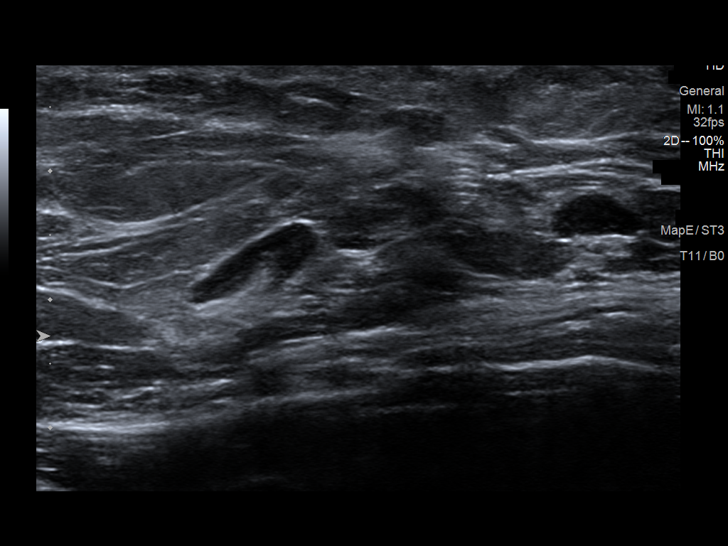

[13 of 17 positions shown; findings below may reference images not displayed]

No prior ultrasound.

ACR Breast Density Category b: There are scattered areas of
fibroglandular density.
FINDINGS: Standard 2D and tomosynthesis full field CC and MLO views of the
right breast were obtained.

The masses in the upper outer quadrant of the right breast at
posterior depth both have central fat and therefore likely represent
normal intramammary lymph nodes. There is no associated
architectural distortion or suspicious calcification. No suspicious
findings elsewhere in the right breast.

Mammographic images were processed with CAD.

On physical exam, there is no palpable abnormality in the upper
outer right breast.

Targeted right breast ultrasound is performed, showing an oval
hypoechoic mass with equal acoustic characteristics and no internal
power Doppler flow at the 10 o'clock position approximately 10 cm
from the nipple measuring approximately 3 x 5 x 6 mm, possibly
containing microcalcifications. This likely corresponds to the
smaller area of concern on screening mammography.

At the 9:30 o'clock position approximately 11 cm from the nipple is
a normal-appearing intramammary lymph node measuring 2 x 3 x 3 mm,
with a normal vessel entering the central fatty hilus.

In the low right axilla is an approximate 11 mm normal-appearing
lymph node with a normal cortical thickness of 3 mm. This
corresponds to the larger area of concern on screening mammography.
IMPRESSION: 1. Likely benign 6 mm fibroadenoma in the upper outer quadrant of
the right breast at posterior depth (10 o'clock, 10 cm from the
nipple).
2. 3 mm normal appearing intramammary lymph node in the upper outer
quadrant of the right breast (9:30 o'clock, 11 cm from the nipple).
3. Normal appearing lymph node in the low right axilla.

RECOMMENDATION:
We discussed management options for the likely benign fibroadenoma
including excision, ultrasound-guided core biopsy, and short term
interval follow-up. Follow-up right breast ultrasound is recommended
at 6, 12 and 24 months to assess stability. The patient concurs with
this plan.

I have discussed the findings and recommendations with the patient.
Results were also provided in writing at the conclusion of the
visit. If applicable, a reminder letter will be sent to the patient
regarding the next appointment.

BI-RADS CATEGORY  3: Probably benign.

## 2017-04-23 ENCOUNTER — Ambulatory Visit (AMBULATORY_SURGERY_CENTER): Payer: Self-pay | Admitting: *Deleted

## 2017-04-23 VITALS — Ht 61.0 in | Wt 146.0 lb

## 2017-04-23 DIAGNOSIS — Z1211 Encounter for screening for malignant neoplasm of colon: Secondary | ICD-10-CM

## 2017-04-23 MED ORDER — NA SULFATE-K SULFATE-MG SULF 17.5-3.13-1.6 GM/177ML PO SOLN: 1.0000 | Freq: Once | ORAL | 0 refills | Status: AC

## 2017-04-23 MED ORDER — BISACODYL 5 MG PO TBEC: 5.0000 mg | DELAYED_RELEASE_TABLET | Freq: Once | ORAL | 0 refills | Status: AC

## 2017-04-23 MED ORDER — POLYETHYLENE GLYCOL 3350 17 GM/SCOOP PO POWD: 1.0000 | Freq: Every day | ORAL | 3 refills | Status: DC

## 2017-04-23 NOTE — Progress Notes (Signed)
No egg or soy allergy known to patient  No issues with past sedation with any surgeries  or procedures, no intubation problems  No diet pills per patient No home 02 use per patient  No blood thinners per patient  Pt states  issues with constipation - she uses OTC meds still has occ issues- will do a 2 day suprep as pt has [place] medicaid  No A fib or A flutter  EMMI video sent to pt's e mail -pt has no e mail

## 2017-04-27 HISTORY — PX: COLONOSCOPY: SHX174

## 2017-05-01 NOTE — Progress Notes (Signed)
   [facility] Phone: [phone]   Date of Visit: 05/02/2017   HPI:  Hypothyroidism:  - Medication: Synthroid 134mcg - compliant with medication. She takes in the morning on an empty stomach and waits 1 hour before eating or taking other medications - denies any weight change, diarrhea/constipation, skin changes, hair changes  HTN:  - Medication: HCTC 12.5 - compliant with medication.  - does not check BP at home - denies chest pain, shortness of breath   HLD: - Medication: Simvastatin 40mg  daily - compliant with medication  - no abdominal pain  - reports of cramps in her calves at times but reports this is due to her hypothyroid  History of microscopic Hematuria: - no gross hematuria - does have tobacco use history    ROS: See HPI.  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  PHYSICAL EXAM: BP 122/80 (BP Location: Left Arm, Patient Position: Sitting, Cuff Size: Normal)   Pulse 83   Temp 98.4 F (36.9 C) (Oral)   Wt 146 lb 6.4 oz (66.4 kg)   SpO2 96%   BMI 27.66 kg/m  GEN: NAD CV: RRR, no murmurs, rubs, or gallops PULM: CTAB, normal effort ABD: Soft, nontender, nondistended, NABS, no organomegaly SKIN: No rash or cyanosis; warm and well-perfused EXTR: No lower extremity edema or calf tenderness PSYCH: Mood and affect euthymic, normal rate and volume of speech NEURO: Awake, alert, no focal deficits grossly, normal speech  ASSESSMENT/PLAN:  Health maintenance:  - patient has colonoscopy re-scheduled   Hypothyroidism Patient is compliant with her medication. Will check TSH since we increased her dose after last visit.   Hypertension, benign Well controlled. Compliant.   Hyperlipidemia Compliant with statin. We discussed her lipid results from January, and discussed the importance of regular exercise and healthy low fat diet. Will order Lipid panel as a future order. Patient to make lab visit and  come in fasting. Additionally, discussed the importance of taking ASA 81mg  daily for heart protection. She reports she should be able to afford it now. She declined information about MAP program.   History of Microscopic Hematuria:  - UA  Follow up pending TSH results   Brooke Houseman, MD PGY [facility]

## 2017-05-02 ENCOUNTER — Encounter: Payer: Self-pay | Admitting: Internal Medicine

## 2017-05-02 ENCOUNTER — Ambulatory Visit (INDEPENDENT_AMBULATORY_CARE_PROVIDER_SITE_OTHER): Payer: Medicare Other | Admitting: Internal Medicine

## 2017-05-02 VITALS — BP 122/80 | HR 83 | Temp 98.4°F | Wt 146.4 lb

## 2017-05-02 DIAGNOSIS — E785 Hyperlipidemia, unspecified: Secondary | ICD-10-CM | POA: Diagnosis not present

## 2017-05-02 DIAGNOSIS — Z23 Encounter for immunization: Secondary | ICD-10-CM | POA: Diagnosis not present

## 2017-05-02 DIAGNOSIS — I10 Essential (primary) hypertension: Secondary | ICD-10-CM | POA: Diagnosis not present

## 2017-05-02 DIAGNOSIS — E039 Hypothyroidism, unspecified: Secondary | ICD-10-CM

## 2017-05-02 DIAGNOSIS — Z87448 Personal history of other diseases of urinary system: Secondary | ICD-10-CM | POA: Diagnosis not present

## 2017-05-02 LAB — POCT URINALYSIS DIP (MANUAL ENTRY)
BILIRUBIN UA: NEGATIVE
BILIRUBIN UA: NEGATIVE mg/dL
Blood, UA: NEGATIVE
Glucose, UA: NEGATIVE mg/dL
LEUKOCYTES UA: NEGATIVE
Nitrite, UA: NEGATIVE
Protein Ur, POC: NEGATIVE mg/dL
Spec Grav, UA: 1.015 (ref 1.010–1.025)
Urobilinogen, UA: 0.2 E.U./dL
pH, UA: 6.5 (ref 5.0–8.0)

## 2017-05-02 NOTE — Patient Instructions (Signed)
Thank you for coming in.   We will check your thyroid and urine today  Please make a follow up lab appointment for your cholesterol test (come in fasting)

## 2017-05-02 NOTE — Assessment & Plan Note (Signed)
Patient is compliant with her medication. Will check TSH since we increased her dose after last visit.

## 2017-05-02 NOTE — Assessment & Plan Note (Signed)
Well controlled. Compliant.

## 2017-05-02 NOTE — Assessment & Plan Note (Addendum)
Compliant with statin. We discussed her lipid results from January, and discussed the importance of regular exercise and healthy low fat diet. Will order Lipid panel as a future order. Patient to make lab visit and come in fasting. Additionally, discussed the importance of taking ASA 81mg  daily for heart protection. She reports she should be able to afford it now. She declined information about MAP program.

## 2017-05-03 LAB — LIPID PANEL
CHOL/HDL RATIO: 4.4 ratio (ref 0.0–4.4)
Cholesterol, Total: 266 mg/dL — ABNORMAL HIGH (ref 100–199)
HDL: 60 mg/dL (ref >39)
LDL CALC: 159 mg/dL — AB (ref 0–99)
Triglycerides: 236 mg/dL — ABNORMAL HIGH (ref 0–149)
VLDL CHOLESTEROL CAL: 47 mg/dL — AB (ref 5–40)

## 2017-05-03 LAB — TSH: TSH: 84.61 u[IU]/mL — AB (ref 0.450–4.500)

## 2017-05-05 ENCOUNTER — Other Ambulatory Visit: Payer: Self-pay | Admitting: Internal Medicine

## 2017-05-05 NOTE — Progress Notes (Signed)
Please refer to phone note from today.

## 2017-05-06 ENCOUNTER — Telehealth: Payer: Self-pay | Admitting: Internal Medicine

## 2017-05-06 MED ORDER — ATORVASTATIN CALCIUM 40 MG PO TABS: 40.0000 mg | ORAL_TABLET | Freq: Every day | ORAL | 3 refills | Status: DC

## 2017-05-06 MED ORDER — LEVOTHYROXINE SODIUM 175 MCG PO TABS: 175.0000 ug | ORAL_TABLET | Freq: Every day | ORAL | 0 refills | Status: DC

## 2017-05-06 NOTE — Telephone Encounter (Signed)
Discussed with patient regarding lipid and TSH. Discussed that will increase Synthroid to 173mcg (from 114mcg as her TSH is significantly elevated)  We will also discontinue Simvastatin and Start Lipitor 40mg  (higher intensity).  Follow up in 6 weeks.

## 2017-05-14 ENCOUNTER — Ambulatory Visit (AMBULATORY_SURGERY_CENTER): Payer: Medicare Other | Admitting: Internal Medicine

## 2017-05-14 ENCOUNTER — Encounter: Payer: Self-pay | Admitting: Internal Medicine

## 2017-05-14 VITALS — BP 146/73 | HR 63 | Temp 97.3°F | Resp 19 | Ht 61.0 in | Wt 146.0 lb

## 2017-05-14 DIAGNOSIS — D125 Benign neoplasm of sigmoid colon: Secondary | ICD-10-CM | POA: Diagnosis not present

## 2017-05-14 DIAGNOSIS — F329 Major depressive disorder, single episode, unspecified: Secondary | ICD-10-CM | POA: Diagnosis not present

## 2017-05-14 DIAGNOSIS — D126 Benign neoplasm of colon, unspecified: Secondary | ICD-10-CM | POA: Diagnosis not present

## 2017-05-14 DIAGNOSIS — D122 Benign neoplasm of ascending colon: Secondary | ICD-10-CM

## 2017-05-14 DIAGNOSIS — K635 Polyp of colon: Secondary | ICD-10-CM | POA: Diagnosis not present

## 2017-05-14 DIAGNOSIS — Z1211 Encounter for screening for malignant neoplasm of colon: Secondary | ICD-10-CM

## 2017-05-14 DIAGNOSIS — I10 Essential (primary) hypertension: Secondary | ICD-10-CM | POA: Diagnosis not present

## 2017-05-14 MED ORDER — SODIUM CHLORIDE 0.9 % IV SOLN: 500.0000 mL | INTRAVENOUS | Status: DC

## 2017-05-14 NOTE — Progress Notes (Signed)
Pt's states no medical or surgical changes since previsit or office visit. 

## 2017-05-14 NOTE — Patient Instructions (Addendum)
I found and removed 2 small polyps that look benign, I will let you know pathology results and when to have another routine colonoscopy by mail and/or My Chart.   You also have a condition called diverticulosis - common and not usually a problem. Please read the handout provided.  Also saw hemorrhoids. If you have hemorrhoid problems (swelling, itching, bleeding) I am able to treat those with an in-office procedure. If you like, please call my office at [phone] to schedule an appointment and I can evaluate you further.  I appreciate the opportunity to care for you. Gatha Mayer, MD, FACG  YOU HAD AN ENDOSCOPIC PROCEDURE TODAY AT [facility]:   Refer to the procedure report that was given to you for any specific questions about what was found during the examination.  If the procedure report does not answer your questions, please call your gastroenterologist to clarify.  If you requested that your care partner not be given the details of your procedure findings, then the procedure report has been included in a sealed envelope for you to review at your convenience later.  YOU SHOULD EXPECT: Some feelings of bloating in the abdomen. Passage of more gas than usual.  Walking can help get rid of the air that was put into your GI tract during the procedure and reduce the bloating. If you had a lower endoscopy (such as a colonoscopy or flexible sigmoidoscopy) you may notice spotting of blood in your stool or on the toilet paper. If you underwent a bowel prep for your procedure, you may not have a normal bowel movement for a few days.  Please Note:  You might notice some irritation and congestion in your nose or some drainage.  This is from the oxygen used during your procedure.  There is no need for concern and it should clear up in a day or so.  SYMPTOMS TO REPORT IMMEDIATELY:   Following lower endoscopy (colonoscopy or flexible sigmoidoscopy):  Excessive amounts of blood  in the stool  Significant tenderness or worsening of abdominal pains  Swelling of the abdomen that is new, acute  Fever of 100F or higher  For urgent or emergent issues, a gastroenterologist can be reached at any hour by calling [phone].   DIET:  We do recommend a small meal at first, but then you may proceed to your regular diet.  Drink plenty of fluids but you should avoid alcoholic beverages for 24 hours.  ACTIVITY:  You should plan to take it easy for the rest of today and you should NOT DRIVE or use heavy machinery until tomorrow (because of the sedation medicines used during the test).    FOLLOW UP: Our staff will call the number listed on your records the next business day following your procedure to check on you and address any questions or concerns that you may have regarding the information given to you following your procedure. If we do not reach you, we will leave a message.  However, if you are feeling well and you are not experiencing any problems, there is no need to return our call.  We will assume that you have returned to your regular daily activities without incident.  If any biopsies were taken you will be contacted by phone or by letter within the next 1-3 weeks.  Please call us at [phone] if you have not heard about the biopsies in 3 weeks.   Await for biopsy results to determine next repeat  Colonoscopy screening Polyps (handout given) Hemorrhoids (handout given) Diverticulosis (handout given) Hemorrhoid Banding procedure Wednesday Jun 12, 2017 at 3:15   SIGNATURES/CONFIDENTIALITY: You and/or your care partner have signed paperwork which will be entered into your electronic medical record.  These signatures attest to the fact that that the information above on your After Visit Summary has been reviewed and is understood.  Full responsibility of the confidentiality of this discharge information lies with you and/or your care-partner.

## 2017-05-14 NOTE — Op Note (Signed)
[facility] Patient Name: Brooke Rivera Procedure Date: 05/14/2017 12:36 PM MRN: [id number] Endoscopist: Gatha Mayer , MD Age: 56 Referring MD:  Date of Birth: [birth year] Gender: Female Account #: [id number] Procedure:                Colonoscopy Indications:              Screening for colorectal malignant neoplasm, This                            is the patient's first colonoscopy Medicines:                Propofol per Anesthesia, Monitored Anesthesia Care Procedure:                Pre-Anesthesia Assessment:                           - Prior to the procedure, a History and Physical                            was performed, and patient medications and                            allergies were reviewed. The patient's tolerance of                            previous anesthesia was also reviewed. The risks                            and benefits of the procedure and the sedation                            options and risks were discussed with the patient.                            All questions were answered, and informed consent                            was obtained. Prior Anticoagulants: The patient has                            taken no previous anticoagulant or antiplatelet                            agents. ASA Grade Assessment: II - A patient with                            mild systemic disease. After reviewing the risks                            and benefits, the patient was deemed in                            satisfactory condition to undergo the procedure.  After obtaining informed consent, the colonoscope                            was passed under direct vision. Throughout the                            procedure, the patient's blood pressure, pulse, and                            oxygen saturations were monitored continuously. The                            Colonoscope was introduced through the anus and   advanced to the the cecum, identified by                            appendiceal orifice and ileocecal valve. The                            colonoscopy was performed without difficulty. The                            patient tolerated the procedure well. The quality                            of the bowel preparation was good. The ileocecal                            valve, appendiceal orifice, and rectum were                            photographed. Scope In: 1:09:54 PM Scope Out: 1:20:47 PM Scope Withdrawal Time: 0 hours 7 minutes 48 seconds  Total Procedure Duration: 0 hours 10 minutes 53 seconds  Findings:                 The perianal and digital rectal examinations were                            normal.                           Two sessile polyps were found in the sigmoid colon                            and ascending colon. The polyps were 2 to 5 mm in                            size. These polyps were removed with a cold snare.                            Resection and retrieval were complete. Verification                            of patient identification for the  specimen was                            done. Estimated blood loss was minimal.                           Multiple small and large-mouthed diverticula were                            found in the entire colon.                           Internal hemorrhoids were found during retroflexion                            and during perianal exam. The hemorrhoids were                            Grade III (internal hemorrhoids that prolapse but                            require manual reduction).                           The exam was otherwise without abnormality on                            direct and retroflexion views. Complications:            No immediate complications. Estimated Blood Loss:     Estimated blood loss was minimal. Impression:               - Two 2 to 5 mm polyps in the sigmoid colon and in                             the ascending colon, removed with a cold snare.                            Resected and retrieved.                           - Diverticulosis in the entire examined colon.                           - Internal hemorrhoids.                           - The examination was otherwise normal on direct                            and retroflexion views. Recommendation:           - Patient has a contact number available for                            emergencies. The signs and symptoms of potential  delayed complications were discussed with the                            patient. Return to normal activities tomorrow.                            Written discharge instructions were provided to the                            patient.                           - Resume previous diet.                           - Continue present medications.                           - Repeat colonoscopy is recommended. The                            colonoscopy date will be determined after pathology                            results from today's exam become available for                            review.                           - She may schedule hemorrhoid banding if desired Gatha Mayer, MD 05/14/2017 1:30:26 PM This report has been signed electronically.

## 2017-05-14 NOTE — Progress Notes (Signed)
Report given to PACU, vss 

## 2017-05-14 NOTE — Progress Notes (Signed)
Called to room to assist during endoscopic procedure.  Patient ID and intended procedure confirmed with present staff. Received instructions for my participation in the procedure from the performing physician.  

## 2017-05-15 ENCOUNTER — Telehealth: Payer: Self-pay | Admitting: *Deleted

## 2017-05-15 NOTE — Telephone Encounter (Signed)
  Follow up Call-  Call back number 05/14/2017  Post procedure Call Back phone  # [phone]  Permission to leave phone message Yes  Some recent data might be hidden     Patient questions:  Do you have a fever, pain , or abdominal swelling? No. Pain Score  0 *  Have you tolerated food without any problems? Yes.    Have you been able to return to your normal activities? Yes.    Do you have any questions about your discharge instructions: Diet   No. Medications  No. Follow up visit  No.  Do you have questions or concerns about your Care? No.  Actions: * If pain score is 4 or above: No action needed, pain <4.

## 2017-05-16 ENCOUNTER — Other Ambulatory Visit: Payer: Medicare Other

## 2017-05-19 ENCOUNTER — Emergency Department (HOSPITAL_COMMUNITY)
Admission: EM | Admit: 2017-05-19 | Discharge: 2017-05-19 | Disposition: A | Discharge status: Home / Self Care | Payer: No Typology Code available for payment source | Attending: Emergency Medicine | Admitting: Emergency Medicine

## 2017-05-19 ENCOUNTER — Encounter (HOSPITAL_COMMUNITY): Payer: Self-pay | Admitting: *Deleted

## 2017-05-19 DIAGNOSIS — E039 Hypothyroidism, unspecified: Secondary | ICD-10-CM | POA: Insufficient documentation

## 2017-05-19 DIAGNOSIS — Y939 Activity, unspecified: Secondary | ICD-10-CM | POA: Diagnosis not present

## 2017-05-19 DIAGNOSIS — M545 Low back pain: Secondary | ICD-10-CM | POA: Diagnosis present

## 2017-05-19 DIAGNOSIS — Y999 Unspecified external cause status: Secondary | ICD-10-CM | POA: Diagnosis not present

## 2017-05-19 DIAGNOSIS — F17210 Nicotine dependence, cigarettes, uncomplicated: Secondary | ICD-10-CM | POA: Insufficient documentation

## 2017-05-19 DIAGNOSIS — I10 Essential (primary) hypertension: Secondary | ICD-10-CM | POA: Diagnosis not present

## 2017-05-19 DIAGNOSIS — Y929 Unspecified place or not applicable: Secondary | ICD-10-CM | POA: Insufficient documentation

## 2017-05-19 MED ORDER — MELOXICAM 15 MG PO TABS: 15.0000 mg | ORAL_TABLET | Freq: Every day | ORAL | 0 refills | Status: DC

## 2017-05-19 MED ORDER — BACLOFEN 10 MG PO TABS: 10.0000 mg | ORAL_TABLET | Freq: Three times a day (TID) | ORAL | 0 refills | Status: DC

## 2017-05-19 NOTE — ED Notes (Signed)
Called PT 3 times from [facility], No Answer

## 2017-05-19 NOTE — Discharge Instructions (Signed)
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
## 2017-05-26 ENCOUNTER — Encounter: Payer: Self-pay | Admitting: Internal Medicine

## 2017-05-26 DIAGNOSIS — Z860101 Personal history of adenomatous and serrated colon polyps: Secondary | ICD-10-CM | POA: Insufficient documentation

## 2017-05-26 DIAGNOSIS — Z86010 Personal history of colonic polyps: Secondary | ICD-10-CM | POA: Insufficient documentation

## 2017-05-26 HISTORY — DX: Personal history of colonic polyps: Z86.010

## 2017-05-26 HISTORY — DX: Personal history of adenomatous and serrated colon polyps: Z86.0101

## 2017-05-26 NOTE — Progress Notes (Signed)
Diminutive adenoma and hyperplastic polyps 1 each Recall colon 5 yrs 2023

## 2017-06-12 ENCOUNTER — Encounter: Payer: Medicare Other | Admitting: Internal Medicine

## 2017-09-09 DIAGNOSIS — Z79899 Other long term (current) drug therapy: Secondary | ICD-10-CM | POA: Diagnosis not present

## 2017-09-09 DIAGNOSIS — Z5181 Encounter for therapeutic drug level monitoring: Secondary | ICD-10-CM | POA: Diagnosis not present

## 2017-09-12 DIAGNOSIS — Z79899 Other long term (current) drug therapy: Secondary | ICD-10-CM | POA: Diagnosis not present

## 2017-09-12 DIAGNOSIS — Z5181 Encounter for therapeutic drug level monitoring: Secondary | ICD-10-CM | POA: Diagnosis not present

## 2017-09-17 DIAGNOSIS — Z79899 Other long term (current) drug therapy: Secondary | ICD-10-CM | POA: Diagnosis not present

## 2017-09-17 DIAGNOSIS — Z5181 Encounter for therapeutic drug level monitoring: Secondary | ICD-10-CM | POA: Diagnosis not present

## 2017-09-19 DIAGNOSIS — Z5181 Encounter for therapeutic drug level monitoring: Secondary | ICD-10-CM | POA: Diagnosis not present

## 2017-09-19 DIAGNOSIS — Z79899 Other long term (current) drug therapy: Secondary | ICD-10-CM | POA: Diagnosis not present

## 2017-09-23 DIAGNOSIS — Z79899 Other long term (current) drug therapy: Secondary | ICD-10-CM | POA: Diagnosis not present

## 2017-09-23 DIAGNOSIS — Z5181 Encounter for therapeutic drug level monitoring: Secondary | ICD-10-CM | POA: Diagnosis not present

## 2017-09-25 DIAGNOSIS — Z79899 Other long term (current) drug therapy: Secondary | ICD-10-CM | POA: Diagnosis not present

## 2017-09-25 DIAGNOSIS — Z5181 Encounter for therapeutic drug level monitoring: Secondary | ICD-10-CM | POA: Diagnosis not present

## 2017-10-01 DIAGNOSIS — Z5181 Encounter for therapeutic drug level monitoring: Secondary | ICD-10-CM | POA: Diagnosis not present

## 2017-10-01 DIAGNOSIS — Z79899 Other long term (current) drug therapy: Secondary | ICD-10-CM | POA: Diagnosis not present

## 2017-10-03 DIAGNOSIS — Z79899 Other long term (current) drug therapy: Secondary | ICD-10-CM | POA: Diagnosis not present

## 2017-10-03 DIAGNOSIS — Z5181 Encounter for therapeutic drug level monitoring: Secondary | ICD-10-CM | POA: Diagnosis not present

## 2017-10-08 DIAGNOSIS — Z79899 Other long term (current) drug therapy: Secondary | ICD-10-CM | POA: Diagnosis not present

## 2017-10-08 DIAGNOSIS — Z5181 Encounter for therapeutic drug level monitoring: Secondary | ICD-10-CM | POA: Diagnosis not present

## 2017-10-09 NOTE — Progress Notes (Signed)
   [facility] Phone: [phone]   Date of Visit: 10/10/2017   HPI:  Congestion: - nasal congestion and cough for "a while" (maybe for a few months). Symptoms are stable.  - coughing thick white mucous - no fevers - post-nasal drip  - decreased taste - no shortness of breath but reports of wheezing at times  - no sick contacts  - sometimes she has runny eyes and runny nose but this is rare - mucinex does not help but stopped the cough - cough is worse at night  - tobacco use: 1 pack will last 3-4 days. Smoking since she was 57 years old.   HTN:  - Last took HCTZ one month ago  - does not check BP at home  - denies chest pain   Hypothyroidism:  - patient was prescribed Levothyroxine at the last visit.  - reports she took it but had run out and had been without it for an unknown period of time.  - per chart review, 30tab rx was last prescribed in September 2018  HLD:  - has not taken Atorvastain in an unknown period of time. Patient ran out.   ROS: See HPI.  [place]:  PMH: HTN GERD Hypothyroidism Tobacco Use HLD Hx of colon polyp  Depression   PHYSICAL EXAM: BP 110/80   Pulse 80   Temp 97.7 F (36.5 C) (Oral)   Wt 157 lb (71.2 kg)   SpO2 97%   BMI 29.66 kg/m  GEN: NAD HEENT: Atraumatic, normocephalic, neck supple, EOMI, sclera clear, no tenderness to percussion of sinuses  CV: RRR, no murmurs, rubs, or gallops PULM: CTAB with fleeting wheezing?, normal effort SKIN: No rash or cyanosis; warm and well-perfused EXTR: No lower extremity edema or calf tenderness PSYCH: Mood and affect euthymic, normal rate and volume of speech NEURO: Awake, alert, no focal deficits grossly, normal speech ASSESSMENT/PLAN:  Cough:  Per history, is chronic. Less likely allergy related. Considering COPD with her history of tobacco use. No signs of PNA. O2 saturation normal. Will try Azelastine nasal spray and PRN albuterol as she reported of intermittent  wheezing. Asked patient to schedule appointment with Dr. Valentina Lucks for lung function test. Follow up in 6 weeks.   Hypertension, benign Patient's blood pressure is in the normal range without being on medications. Therefore will not restart HCTZ.   Hyperlipidemia Reordered Atorvastatin. Patient is not consistently compliant with medication. Discussed the importance of this.   Hypothyroidism Patient seems to not call for refills. She has been out of medication for over 6 weeks at least. Therefore, will not obtain TSH today as it will not change management. Reorder Synthroid 176mcg daily. Follow up in 6 weeks.   Smiley Houseman, MD PGY [facility]

## 2017-10-10 ENCOUNTER — Encounter: Payer: Self-pay | Admitting: Internal Medicine

## 2017-10-10 ENCOUNTER — Other Ambulatory Visit: Payer: Self-pay

## 2017-10-10 ENCOUNTER — Ambulatory Visit (INDEPENDENT_AMBULATORY_CARE_PROVIDER_SITE_OTHER): Payer: Medicare Other | Admitting: Internal Medicine

## 2017-10-10 VITALS — BP 110/80 | HR 80 | Temp 97.7°F | Wt 157.0 lb

## 2017-10-10 DIAGNOSIS — N183 Chronic kidney disease, stage 3 unspecified: Secondary | ICD-10-CM

## 2017-10-10 DIAGNOSIS — E039 Hypothyroidism, unspecified: Secondary | ICD-10-CM

## 2017-10-10 DIAGNOSIS — E785 Hyperlipidemia, unspecified: Secondary | ICD-10-CM | POA: Diagnosis not present

## 2017-10-10 DIAGNOSIS — R05 Cough: Secondary | ICD-10-CM

## 2017-10-10 DIAGNOSIS — I10 Essential (primary) hypertension: Secondary | ICD-10-CM | POA: Diagnosis not present

## 2017-10-10 DIAGNOSIS — R718 Other abnormality of red blood cells: Secondary | ICD-10-CM | POA: Diagnosis not present

## 2017-10-10 DIAGNOSIS — R059 Cough, unspecified: Secondary | ICD-10-CM

## 2017-10-10 DIAGNOSIS — Z79899 Other long term (current) drug therapy: Secondary | ICD-10-CM | POA: Diagnosis not present

## 2017-10-10 DIAGNOSIS — Z5181 Encounter for therapeutic drug level monitoring: Secondary | ICD-10-CM | POA: Diagnosis not present

## 2017-10-10 MED ORDER — LEVOTHYROXINE SODIUM 175 MCG PO TABS
175.0000 ug | ORAL_TABLET | Freq: Every day | ORAL | 3 refills | Status: DC
Start: 2017-10-10 — End: 2018-03-10

## 2017-10-10 MED ORDER — ALBUTEROL SULFATE HFA 108 (90 BASE) MCG/ACT IN AERS
2.0000 | INHALATION_SPRAY | Freq: Four times a day (QID) | RESPIRATORY_TRACT | 0 refills | Status: DC | PRN
Start: 2017-10-10 — End: 2020-06-27

## 2017-10-10 MED ORDER — AZELASTINE HCL 0.1 % NA SOLN: 1.0000 | Freq: Two times a day (BID) | NASAL | 0 refills | Status: DC

## 2017-10-10 MED ORDER — ATORVASTATIN CALCIUM 40 MG PO TABS: 40.0000 mg | ORAL_TABLET | Freq: Every day | ORAL | 3 refills | Status: DC

## 2017-10-10 NOTE — Assessment & Plan Note (Signed)
Reordered Atorvastatin. Patient is not consistently compliant with medication. Discussed the importance of this.

## 2017-10-10 NOTE — Patient Instructions (Addendum)
We can try to use the nasal spray  You can use albuterol as needed for wheezing.  Please make an appointment with Dr. Valentina Lucks for lung function test  Restart your medications except for your blood pressure  FOllow up in 6 weeks.

## 2017-10-10 NOTE — Assessment & Plan Note (Signed)
Patient's blood pressure is in the normal range without being on medications. Therefore will not restart HCTZ.

## 2017-10-10 NOTE — Assessment & Plan Note (Signed)
Patient seems to not call for refills. She has been out of medication for over 6 weeks at least. Therefore, will not obtain TSH today as it will not change management. Reorder Synthroid 130mcg daily. Follow up in 6 weeks.

## 2017-10-11 LAB — CBC
Hematocrit: 34.7 % (ref 34.0–46.6)
Hemoglobin: 11.5 g/dL (ref 11.1–15.9)
MCH: 24.8 pg — ABNORMAL LOW (ref 26.6–33.0)
MCHC: 33.1 g/dL (ref 31.5–35.7)
MCV: 75 fL — ABNORMAL LOW (ref 79–97)
PLATELETS: 258 10*3/uL (ref 150–379)
RBC: 4.63 x10E6/uL (ref 3.77–5.28)
RDW: 17.8 % — ABNORMAL HIGH (ref 12.3–15.4)
WBC: 5 10*3/uL (ref 3.4–10.8)

## 2017-10-11 LAB — CMP14+EGFR
A/G RATIO: 2 (ref 1.2–2.2)
ALBUMIN: 4.5 g/dL (ref 3.5–5.5)
ALT: 79 IU/L — ABNORMAL HIGH (ref 0–32)
AST: 66 IU/L — ABNORMAL HIGH (ref 0–40)
Alkaline Phosphatase: 47 IU/L (ref 39–117)
BILIRUBIN TOTAL: 0.4 mg/dL (ref 0.0–1.2)
BUN / CREAT RATIO: 6 — AB (ref 9–23)
BUN: 9 mg/dL (ref 6–24)
CHLORIDE: 98 mmol/L (ref 96–106)
CO2: 26 mmol/L (ref 20–29)
Calcium: 9.2 mg/dL (ref 8.7–10.2)
Creatinine, Ser: 1.44 mg/dL — ABNORMAL HIGH (ref 0.57–1.00)
GFR calc non Af Amer: 40 mL/min/{1.73_m2} — ABNORMAL LOW (ref >59)
GFR, EST AFRICAN AMERICAN: 46 mL/min/{1.73_m2} — AB (ref >59)
GLUCOSE: 108 mg/dL — AB (ref 65–99)
Globulin, Total: 2.2 g/dL (ref 1.5–4.5)
POTASSIUM: 3.7 mmol/L (ref 3.5–5.2)
Sodium: 142 mmol/L (ref 134–144)
TOTAL PROTEIN: 6.7 g/dL (ref 6.0–8.5)

## 2017-10-15 ENCOUNTER — Telehealth: Payer: Self-pay | Admitting: Internal Medicine

## 2017-10-15 DIAGNOSIS — Z5181 Encounter for therapeutic drug level monitoring: Secondary | ICD-10-CM | POA: Diagnosis not present

## 2017-10-15 DIAGNOSIS — Z79899 Other long term (current) drug therapy: Secondary | ICD-10-CM | POA: Diagnosis not present

## 2017-10-15 NOTE — Telephone Encounter (Signed)
Attempted to call but phone continues to ring then disconnects.  I want to talk to her to discuss her CMP results.  Her creatinine has been very slowly increasing over the past year.  She does not have a history of hypertension or diabetes.  I want to ask her about any NSAID use and any difficulties with urination.  I want to repeat this lab in about 3 weeks and also obtain a urinalysis for further evaluation.  Additionally her AST and ALT which are her liver enzyme tests are very mildly elevated.  She has not been using her atorvastatin in the past few months anyway so I do not think this is related to atorvastatin.  I would like to repeat in about 3 weeks as this could be a transient elevation.  I want her to monitor for right upper quadrant pain or myalgias since we are restarting her atorvastatin.  We will try again at a later time.

## 2017-10-16 NOTE — Telephone Encounter (Signed)
Got in touch with patient to inform what was mentioned in the last note .

## 2017-10-17 DIAGNOSIS — Z79899 Other long term (current) drug therapy: Secondary | ICD-10-CM | POA: Diagnosis not present

## 2017-10-17 DIAGNOSIS — Z5181 Encounter for therapeutic drug level monitoring: Secondary | ICD-10-CM | POA: Diagnosis not present

## 2017-10-22 DIAGNOSIS — Z5181 Encounter for therapeutic drug level monitoring: Secondary | ICD-10-CM | POA: Diagnosis not present

## 2017-10-22 DIAGNOSIS — Z79899 Other long term (current) drug therapy: Secondary | ICD-10-CM | POA: Diagnosis not present

## 2017-10-24 DIAGNOSIS — Z79899 Other long term (current) drug therapy: Secondary | ICD-10-CM | POA: Diagnosis not present

## 2017-10-24 DIAGNOSIS — Z5181 Encounter for therapeutic drug level monitoring: Secondary | ICD-10-CM | POA: Diagnosis not present

## 2017-10-28 DIAGNOSIS — Z5181 Encounter for therapeutic drug level monitoring: Secondary | ICD-10-CM | POA: Diagnosis not present

## 2017-10-28 DIAGNOSIS — Z79899 Other long term (current) drug therapy: Secondary | ICD-10-CM | POA: Diagnosis not present

## 2017-10-30 DIAGNOSIS — Z5181 Encounter for therapeutic drug level monitoring: Secondary | ICD-10-CM | POA: Diagnosis not present

## 2017-10-30 DIAGNOSIS — Z79899 Other long term (current) drug therapy: Secondary | ICD-10-CM | POA: Diagnosis not present

## 2017-11-11 ENCOUNTER — Ambulatory Visit: Payer: Medicare Other | Admitting: Internal Medicine

## 2017-11-13 DIAGNOSIS — Z5181 Encounter for therapeutic drug level monitoring: Secondary | ICD-10-CM | POA: Diagnosis not present

## 2017-11-13 DIAGNOSIS — Z79899 Other long term (current) drug therapy: Secondary | ICD-10-CM | POA: Diagnosis not present

## 2017-11-15 DIAGNOSIS — Z5181 Encounter for therapeutic drug level monitoring: Secondary | ICD-10-CM | POA: Diagnosis not present

## 2017-11-15 DIAGNOSIS — Z79899 Other long term (current) drug therapy: Secondary | ICD-10-CM | POA: Diagnosis not present

## 2017-11-19 DIAGNOSIS — Z5181 Encounter for therapeutic drug level monitoring: Secondary | ICD-10-CM | POA: Diagnosis not present

## 2017-11-19 DIAGNOSIS — Z79899 Other long term (current) drug therapy: Secondary | ICD-10-CM | POA: Diagnosis not present

## 2017-11-20 NOTE — Progress Notes (Signed)
   [facility] Phone: [phone]   Date of Visit: 11/21/2017   HPI:  Dry Skin I Weight Loss  - reports of 2-3 week history of dry skin through out her body. Worse in her lower extremities. Significant dry skin to the point of peeling of skin. No open wounds. It does not itch. No history of eczema.  - has not changed any detergents, soaps, or lotions - had one prior episode similar to this but did not last this long. This was a few years ago -  No fever, chills, night sweats.  - 18 lb weight loss since 09/2017. In September 2018, weighed 145-146lb  - she does use tobacco  - drinks alcohol rarely  - colonoscopy: 04/2017. Diminutive adenoma and hyperplastic polyps 1 each. Repeat in 5 years.  - patient up to date on pap: 2016 normal cytology and negative HPV - not up to date on mammogram; last  Mammogram in 08/2016 with likely benign 25m fibroadenoma in the right breast and 364mnormal appearing intramammary lymph node in the right breast. Recommended follow up [place] 6 months, which was not done per chart review.   Elevated LFT:   ROS: See HPI.  [place] PMH: Hypothyroidism GERD HTN HLD  Tobacco Use Depressive DO   PHYSICAL EXAM: BP 110/80   Pulse 71   Temp (!) 97.5 F (36.4 C) (Oral)   Wt 139 lb 3.2 oz (63.1 kg)   SpO2 92%   BMI 26.30 kg/m  GEN: NAD HEENT: Atraumatic, normocephalic, neck supple without lymphadenopathy, EOMI, sclera clear  CV: RRR, no murmurs, rubs, or gallops PULM: CTAB, normal effort SKIN: warm and well-perfused, significant dry skin worse in the lower extremities but also on the back and upper extremities. No open wounds or drainage.   EXTR: No lower extremity edema or calf tenderness PSYCH: Mood and affect euthymic, normal rate and volume of speech NEURO: Awake, alert, no focal deficits grossly, normal speech  ASSESSMENT/PLAN:  Hypothyroidism Has been compliant with medication since the last visit. Will repeat TSH.   TSH  improved yet still elevated. I called patient and she now reports that she has missed a few doses a week. Therefore will not changing dose. Continue Synthroid 17574mdaily. Discussed the importance of daily use of the medication.   Loss of weight I wonder if this is due to patient being compliant to synthroid medication. She is up to date on health maintenance screening except for mammogram. We will go ahead and obtain this (will call patient to inform).  her CBC was overall unremarkable. Will repeat CMP today as she had slight elevation of LFTs at prior visit. Her UA was overall unremarkable (only 0-3 RBC).  Will have her follow up in 2 weeks. If she continues to loose weight then, will start the unintentional weight loss work up with CXR, ESR/CRP, HIV, repeat Hep C.   Dry skin Unclear in etiology currently. It is odd to think this is due to her hypothyroidism as she has had hypothyroidism with elevated TSH for a while without these symptoms. Will do Auqaphor/Eucerin for now. Follow up in 2 weeks. Refer to weight loss assessment and plan.    KanSmiley HousemanD PGY 3 [facility]

## 2017-11-21 ENCOUNTER — Ambulatory Visit (INDEPENDENT_AMBULATORY_CARE_PROVIDER_SITE_OTHER): Payer: Medicare Other | Admitting: Internal Medicine

## 2017-11-21 ENCOUNTER — Encounter: Payer: Self-pay | Admitting: Internal Medicine

## 2017-11-21 VITALS — BP 110/80 | HR 71 | Temp 97.5°F | Wt 139.2 lb

## 2017-11-21 DIAGNOSIS — L853 Xerosis cutis: Secondary | ICD-10-CM

## 2017-11-21 DIAGNOSIS — N183 Chronic kidney disease, stage 3 unspecified: Secondary | ICD-10-CM

## 2017-11-21 DIAGNOSIS — R718 Other abnormality of red blood cells: Secondary | ICD-10-CM | POA: Diagnosis not present

## 2017-11-21 DIAGNOSIS — R928 Other abnormal and inconclusive findings on diagnostic imaging of breast: Secondary | ICD-10-CM

## 2017-11-21 DIAGNOSIS — Z1239 Encounter for other screening for malignant neoplasm of breast: Secondary | ICD-10-CM

## 2017-11-21 DIAGNOSIS — R945 Abnormal results of liver function studies: Secondary | ICD-10-CM | POA: Diagnosis not present

## 2017-11-21 DIAGNOSIS — Z5181 Encounter for therapeutic drug level monitoring: Secondary | ICD-10-CM | POA: Diagnosis not present

## 2017-11-21 DIAGNOSIS — Z1231 Encounter for screening mammogram for malignant neoplasm of breast: Secondary | ICD-10-CM

## 2017-11-21 DIAGNOSIS — R634 Abnormal weight loss: Secondary | ICD-10-CM

## 2017-11-21 DIAGNOSIS — R7989 Other specified abnormal findings of blood chemistry: Secondary | ICD-10-CM

## 2017-11-21 DIAGNOSIS — E039 Hypothyroidism, unspecified: Secondary | ICD-10-CM | POA: Diagnosis not present

## 2017-11-21 DIAGNOSIS — Z79899 Other long term (current) drug therapy: Secondary | ICD-10-CM | POA: Diagnosis not present

## 2017-11-21 LAB — POCT URINALYSIS DIP (MANUAL ENTRY)
Bilirubin, UA: NEGATIVE
Glucose, UA: NEGATIVE mg/dL
Ketones, POC UA: NEGATIVE mg/dL
LEUKOCYTES UA: NEGATIVE
NITRITE UA: NEGATIVE
Spec Grav, UA: 1.02 (ref 1.010–1.025)
Urobilinogen, UA: 0.2 E.U./dL
pH, UA: 6.5 (ref 5.0–8.0)

## 2017-11-21 LAB — POCT UA - MICROSCOPIC ONLY

## 2017-11-21 NOTE — Patient Instructions (Addendum)
We will get some blood work today.  I will call you about the results   You can try Eucerin or Aquaphor for your dry skin. The lab work will help [place] figure out why you have dry skin.

## 2017-11-22 ENCOUNTER — Encounter: Payer: Self-pay | Admitting: Internal Medicine

## 2017-11-22 DIAGNOSIS — R634 Abnormal weight loss: Secondary | ICD-10-CM | POA: Insufficient documentation

## 2017-11-22 DIAGNOSIS — L853 Xerosis cutis: Secondary | ICD-10-CM | POA: Insufficient documentation

## 2017-11-22 HISTORY — DX: Abnormal weight loss: R63.4

## 2017-11-22 HISTORY — DX: Xerosis cutis: L85.3

## 2017-11-22 LAB — CMP14+EGFR
ALBUMIN: 4.5 g/dL (ref 3.5–5.5)
ALK PHOS: 56 IU/L (ref 39–117)
ALT: 11 IU/L (ref 0–32)
AST: 14 IU/L (ref 0–40)
Albumin/Globulin Ratio: 1.6 (ref 1.2–2.2)
BILIRUBIN TOTAL: 0.2 mg/dL (ref 0.0–1.2)
BUN / CREAT RATIO: 9 (ref 9–23)
BUN: 9 mg/dL (ref 6–24)
CHLORIDE: 99 mmol/L (ref 96–106)
CO2: 27 mmol/L (ref 20–29)
CREATININE: 0.96 mg/dL (ref 0.57–1.00)
Calcium: 9.6 mg/dL (ref 8.7–10.2)
GFR calc Af Amer: 75 mL/min/{1.73_m2} (ref >59)
GFR calc non Af Amer: 65 mL/min/{1.73_m2} (ref >59)
GLOBULIN, TOTAL: 2.9 g/dL (ref 1.5–4.5)
Glucose: 94 mg/dL (ref 65–99)
Potassium: 4.8 mmol/L (ref 3.5–5.2)
SODIUM: 141 mmol/L (ref 134–144)
Total Protein: 7.4 g/dL (ref 6.0–8.5)

## 2017-11-22 LAB — IRON AND TIBC
IRON: 46 ug/dL (ref 27–159)
Iron Saturation: 13 % — ABNORMAL LOW (ref 15–55)
Total Iron Binding Capacity: 364 ug/dL (ref 250–450)
UIBC: 318 ug/dL (ref 131–425)

## 2017-11-22 LAB — FERRITIN: Ferritin: 57 ng/mL (ref 15–150)

## 2017-11-22 LAB — TSH: TSH: 34.53 u[IU]/mL — AB (ref 0.450–4.500)

## 2017-11-22 NOTE — Assessment & Plan Note (Signed)
Unclear in etiology currently. It is odd to think this is due to her hypothyroidism as she has had hypothyroidism with elevated TSH for a while without these symptoms. Will do Auqaphor/Eucerin for now. Follow up in 2 weeks. Refer to weight loss assessment and plan.

## 2017-11-22 NOTE — Assessment & Plan Note (Signed)
I wonder if this is due to patient being compliant to synthroid medication. She is up to date on health maintenance screening except for mammogram. We will go ahead and obtain this (will call patient to inform).  her CBC was overall unremarkable. Will repeat CMP today as she had slight elevation of LFTs at prior visit. Her UA was overall unremarkable (only 0-3 RBC).  Will have her follow up in 2 weeks. If she continues to loose weight then, will start the unintentional weight loss work up with CXR, ESR/CRP, HIV, repeat Hep C.

## 2017-11-22 NOTE — Assessment & Plan Note (Addendum)
Has been compliant with medication since the last visit. Will repeat TSH.   TSH improved yet still elevated. I called patient and she now reports that she has missed a few doses a week. Therefore will not changing dose. Continue Synthroid 151mcg daily. Discussed the importance of daily use of the medication.

## 2017-11-24 ENCOUNTER — Other Ambulatory Visit: Payer: Self-pay | Admitting: Internal Medicine

## 2017-11-24 DIAGNOSIS — D241 Benign neoplasm of right breast: Secondary | ICD-10-CM

## 2017-11-25 DIAGNOSIS — Z79899 Other long term (current) drug therapy: Secondary | ICD-10-CM | POA: Diagnosis not present

## 2017-11-25 DIAGNOSIS — Z5181 Encounter for therapeutic drug level monitoring: Secondary | ICD-10-CM | POA: Diagnosis not present

## 2017-11-27 DIAGNOSIS — Z5181 Encounter for therapeutic drug level monitoring: Secondary | ICD-10-CM | POA: Diagnosis not present

## 2017-11-27 DIAGNOSIS — Z79899 Other long term (current) drug therapy: Secondary | ICD-10-CM | POA: Diagnosis not present

## 2017-11-29 ENCOUNTER — Ambulatory Visit
Admission: RE | Admit: 2017-11-29 | Discharge: 2017-11-29 | Disposition: A | Discharge status: Home / Self Care | Payer: Medicare Other | Source: Ambulatory Visit | Attending: Family Medicine | Admitting: Family Medicine

## 2017-11-29 ENCOUNTER — Other Ambulatory Visit: Payer: Self-pay | Admitting: Internal Medicine

## 2017-11-29 ENCOUNTER — Encounter: Payer: Self-pay | Admitting: Internal Medicine

## 2017-11-29 DIAGNOSIS — D241 Benign neoplasm of right breast: Secondary | ICD-10-CM

## 2017-11-29 DIAGNOSIS — R928 Other abnormal and inconclusive findings on diagnostic imaging of breast: Secondary | ICD-10-CM | POA: Insufficient documentation

## 2017-11-29 HISTORY — DX: Other abnormal and inconclusive findings on diagnostic imaging of breast: R92.8

## 2017-12-04 DIAGNOSIS — Z79899 Other long term (current) drug therapy: Secondary | ICD-10-CM | POA: Diagnosis not present

## 2017-12-04 DIAGNOSIS — Z5181 Encounter for therapeutic drug level monitoring: Secondary | ICD-10-CM | POA: Diagnosis not present

## 2017-12-05 NOTE — Progress Notes (Deleted)
   [facility] Phone: [phone]   Date of Visit: 12/06/2017   HPI:  ***  ROS: See HPI.  [place]: ***  PHYSICAL EXAM: There were no vitals taken for this visit. Gen: *** HEENT: *** Heart: *** Lungs: *** Neuro: *** Ext: ***  ASSESSMENT/PLAN:  Health maintenance:  -***  No problem-specific Assessment & Plan notes found for this encounter.  FOLLOW UP: Follow up in *** for ***  Smiley Houseman, MD PGY [facility]

## 2017-12-06 ENCOUNTER — Ambulatory Visit: Payer: Medicare Other | Admitting: Internal Medicine

## 2017-12-06 DIAGNOSIS — Z79899 Other long term (current) drug therapy: Secondary | ICD-10-CM | POA: Diagnosis not present

## 2017-12-06 DIAGNOSIS — Z5181 Encounter for therapeutic drug level monitoring: Secondary | ICD-10-CM | POA: Diagnosis not present

## 2017-12-09 DIAGNOSIS — Z79899 Other long term (current) drug therapy: Secondary | ICD-10-CM | POA: Diagnosis not present

## 2017-12-09 DIAGNOSIS — Z5181 Encounter for therapeutic drug level monitoring: Secondary | ICD-10-CM | POA: Diagnosis not present

## 2017-12-11 DIAGNOSIS — Z5181 Encounter for therapeutic drug level monitoring: Secondary | ICD-10-CM | POA: Diagnosis not present

## 2017-12-11 DIAGNOSIS — Z79899 Other long term (current) drug therapy: Secondary | ICD-10-CM | POA: Diagnosis not present

## 2017-12-17 DIAGNOSIS — Z5181 Encounter for therapeutic drug level monitoring: Secondary | ICD-10-CM | POA: Diagnosis not present

## 2017-12-17 DIAGNOSIS — Z79899 Other long term (current) drug therapy: Secondary | ICD-10-CM | POA: Diagnosis not present

## 2017-12-19 DIAGNOSIS — Z5181 Encounter for therapeutic drug level monitoring: Secondary | ICD-10-CM | POA: Diagnosis not present

## 2017-12-19 DIAGNOSIS — Z79899 Other long term (current) drug therapy: Secondary | ICD-10-CM | POA: Diagnosis not present

## 2017-12-24 DIAGNOSIS — Z79899 Other long term (current) drug therapy: Secondary | ICD-10-CM | POA: Diagnosis not present

## 2017-12-24 DIAGNOSIS — Z5181 Encounter for therapeutic drug level monitoring: Secondary | ICD-10-CM | POA: Diagnosis not present

## 2017-12-26 DIAGNOSIS — Z79899 Other long term (current) drug therapy: Secondary | ICD-10-CM | POA: Diagnosis not present

## 2017-12-26 DIAGNOSIS — Z5181 Encounter for therapeutic drug level monitoring: Secondary | ICD-10-CM | POA: Diagnosis not present

## 2018-01-01 DIAGNOSIS — Z5181 Encounter for therapeutic drug level monitoring: Secondary | ICD-10-CM | POA: Diagnosis not present

## 2018-01-01 DIAGNOSIS — Z79899 Other long term (current) drug therapy: Secondary | ICD-10-CM | POA: Diagnosis not present

## 2018-01-03 DIAGNOSIS — Z5181 Encounter for therapeutic drug level monitoring: Secondary | ICD-10-CM | POA: Diagnosis not present

## 2018-01-03 DIAGNOSIS — Z79899 Other long term (current) drug therapy: Secondary | ICD-10-CM | POA: Diagnosis not present

## 2018-01-07 DIAGNOSIS — Z5181 Encounter for therapeutic drug level monitoring: Secondary | ICD-10-CM | POA: Diagnosis not present

## 2018-01-07 DIAGNOSIS — Z79899 Other long term (current) drug therapy: Secondary | ICD-10-CM | POA: Diagnosis not present

## 2018-01-09 DIAGNOSIS — Z5181 Encounter for therapeutic drug level monitoring: Secondary | ICD-10-CM | POA: Diagnosis not present

## 2018-01-09 DIAGNOSIS — Z79899 Other long term (current) drug therapy: Secondary | ICD-10-CM | POA: Diagnosis not present

## 2018-01-15 DIAGNOSIS — Z79899 Other long term (current) drug therapy: Secondary | ICD-10-CM | POA: Diagnosis not present

## 2018-01-15 DIAGNOSIS — Z5181 Encounter for therapeutic drug level monitoring: Secondary | ICD-10-CM | POA: Diagnosis not present

## 2018-01-17 DIAGNOSIS — Z5181 Encounter for therapeutic drug level monitoring: Secondary | ICD-10-CM | POA: Diagnosis not present

## 2018-01-17 DIAGNOSIS — Z79899 Other long term (current) drug therapy: Secondary | ICD-10-CM | POA: Diagnosis not present

## 2018-01-22 DIAGNOSIS — Z5181 Encounter for therapeutic drug level monitoring: Secondary | ICD-10-CM | POA: Diagnosis not present

## 2018-01-22 DIAGNOSIS — Z79899 Other long term (current) drug therapy: Secondary | ICD-10-CM | POA: Diagnosis not present

## 2018-01-24 DIAGNOSIS — Z79899 Other long term (current) drug therapy: Secondary | ICD-10-CM | POA: Diagnosis not present

## 2018-01-24 DIAGNOSIS — Z5181 Encounter for therapeutic drug level monitoring: Secondary | ICD-10-CM | POA: Diagnosis not present

## 2018-01-29 DIAGNOSIS — Z5181 Encounter for therapeutic drug level monitoring: Secondary | ICD-10-CM | POA: Diagnosis not present

## 2018-01-29 DIAGNOSIS — Z79899 Other long term (current) drug therapy: Secondary | ICD-10-CM | POA: Diagnosis not present

## 2018-01-31 DIAGNOSIS — Z5181 Encounter for therapeutic drug level monitoring: Secondary | ICD-10-CM | POA: Diagnosis not present

## 2018-01-31 DIAGNOSIS — Z79899 Other long term (current) drug therapy: Secondary | ICD-10-CM | POA: Diagnosis not present

## 2018-02-03 DIAGNOSIS — Z79899 Other long term (current) drug therapy: Secondary | ICD-10-CM | POA: Diagnosis not present

## 2018-02-03 DIAGNOSIS — Z5181 Encounter for therapeutic drug level monitoring: Secondary | ICD-10-CM | POA: Diagnosis not present

## 2018-02-05 DIAGNOSIS — Z5181 Encounter for therapeutic drug level monitoring: Secondary | ICD-10-CM | POA: Diagnosis not present

## 2018-02-05 DIAGNOSIS — Z79899 Other long term (current) drug therapy: Secondary | ICD-10-CM | POA: Diagnosis not present

## 2018-03-10 ENCOUNTER — Ambulatory Visit (INDEPENDENT_AMBULATORY_CARE_PROVIDER_SITE_OTHER): Payer: Medicare Other | Admitting: Family Medicine

## 2018-03-10 ENCOUNTER — Encounter: Payer: Self-pay | Admitting: Family Medicine

## 2018-03-10 ENCOUNTER — Other Ambulatory Visit: Payer: Self-pay

## 2018-03-10 VITALS — BP 142/82 | HR 74 | Temp 98.7°F | Ht 61.0 in | Wt 141.0 lb

## 2018-03-10 DIAGNOSIS — E039 Hypothyroidism, unspecified: Secondary | ICD-10-CM

## 2018-03-10 DIAGNOSIS — I10 Essential (primary) hypertension: Secondary | ICD-10-CM

## 2018-03-10 DIAGNOSIS — R634 Abnormal weight loss: Secondary | ICD-10-CM

## 2018-03-10 DIAGNOSIS — E785 Hyperlipidemia, unspecified: Secondary | ICD-10-CM | POA: Diagnosis not present

## 2018-03-10 MED ORDER — LEVOTHYROXINE SODIUM 175 MCG PO TABS: 175.0000 ug | ORAL_TABLET | Freq: Every day | ORAL | 3 refills | Status: DC

## 2018-03-10 MED ORDER — HYDROCHLOROTHIAZIDE 12.5 MG PO CAPS: 12.5000 mg | ORAL_CAPSULE | Freq: Every day | ORAL | 0 refills | Status: DC

## 2018-03-10 MED ORDER — ATORVASTATIN CALCIUM 40 MG PO TABS: 40.0000 mg | ORAL_TABLET | Freq: Every day | ORAL | 3 refills | Status: DC

## 2018-03-10 NOTE — Assessment & Plan Note (Signed)
Discussed that she may be having myalgias contributing to medication noncompliance on atorvastatin.  Patient prefers to stay on this medication at this time.  She continues to have muscle cramping then would consider dose reduction versus trying rosuvastatin.

## 2018-03-10 NOTE — Assessment & Plan Note (Signed)
Pressure is slightly elevated today and she reports being symptomatic.  Restart HCTZ at low-dose 12.5 mg daily as patient has been on this in the past and tolerated well.  Follow-up in 4 weeks with BMP at that time

## 2018-03-10 NOTE — Progress Notes (Signed)
Subjective:  Brooke Rivera is a 57 y.o. female who presents to the [facility] today for weight loss follow up and BP concerns.  HPI:  Weight loss Last seen for this on 11/21/2017 at which time chart review shows that she has had an 18 pound weight loss since February.  Patient states that she has noticed some weight gain since her last visit.  She has been eating regularly, not always 3 meals a day however continues to graze throughout the day. 24-hour recall: ham and grits for breakfast, sandwich with lettuce and tomato in the afternoon, grilled ribs and chicken for dinner.  States this was a very typical day for her. No fever, chills, night sweats. She is up-to-date on her mammogram screening.  She has had a follow-up ultrasound and does not need a follow-up mammogram for 12 months, due April 2020. She is also up-to-date on her colonoscopy.  Last was September 2018, due in September 2023. Last Pap smear was in 2016 normal cytology negative HPV.  She is now due for Pap smear.  Blood pressure concerns Patient states that she has had an intermittent headache recently.  She is also started to see spots and had a "swimmy feeling in her head" that she recalls similar to when she needed to be on blood pressure in the past. No chest pain, shortness of breath, syncope, presyncope, nausea, vomiting, lower extremity edema. Does report eating a high salt diet and is willing to cut back to a no added salt diet.  Hypothyroidism Has been noncompliant on her Synthroid.  She only takes it 3-4 times a week.  She states that she takes it only when her legs are cramping and then stops it when she feels well. Denies heat or cold intolerance.  Denies hair skin or nail changes.  Hyperlipidemia Reports that has not been compliant on her home atorvastatin.  She does sometimes get muscle cramps in the back of her legs.  At which time she stopped taking the medication.  ROS: Per HPI  PMH: She reports that she  has been smoking cigarettes.  She has been smoking about 0.25 packs per day. She has never used smokeless tobacco. She reports that she drinks alcohol. She reports that she does not use drugs.   Objective:  Physical Exam: BP (!) 142/82   Pulse 74   Temp 98.7 F (37.1 C) (Oral)   Ht 5\' 1"  (1.549 m)   Wt 141 lb (64 kg)   SpO2 94%   BMI 26.64 kg/m   Gen: NAD, resting comfortably CV: RRR with no murmurs appreciated Pulm: NWOB, CTAB with no crackles, wheezes, or rhonchi GI: Normal bowel sounds present. Soft, Nontender, Nondistended. MSK: no edema, cyanosis, or clubbing noted Skin: warm, dry Neuro: grossly normal, moves all extremities Psych: Normal affect and thought content   Wt Readings from Last 3 Encounters:  03/10/18 141 lb (64 kg)  11/21/17 139 lb 3.2 oz (63.1 kg)  10/10/17 157 lb (71.2 kg)    Assessment/Plan:  Loss of weight Resolved.  Weight has been stable for the last 3 to 4 months.  She currently weighs 141 pounds and weighed 139 on 11/21/2017.  She does not have any B symptoms and other than her Pap smear is up-to-date on regular cancer screening.  Patient is noncompliant on her thyroid medication which may impact her weight.  She does have adequate caloric intake.  Hypertension, benign Pressure is slightly elevated today and she reports being symptomatic.  Restart HCTZ at low-dose 12.5 mg daily as patient has been on this in the past and tolerated well.  Follow-up in 4 weeks with BMP at that time  Hypothyroidism Long discussion today regarding need for medication compliance on Synthroid and the pathophysiology of hypothyroidism.  Patient voiced good understanding and now states that she is willing to take this medication every day.  Discussed that she should take this medication regularly and return in 4 to 6 weeks for TSH in order to adjust medication dose as needed  Hyperlipidemia Discussed that she may be having myalgias contributing to medication noncompliance on  atorvastatin.  Patient prefers to stay on this medication at this time.  She continues to have muscle cramping then would consider dose reduction versus trying rosuvastatin.   Bufford Lope, DO PGY-3, [facility] 03/10/2018 2:19 PM

## 2018-03-10 NOTE — Assessment & Plan Note (Signed)
Long discussion today regarding need for medication compliance on Synthroid and the pathophysiology of hypothyroidism.  Patient voiced good understanding and now states that she is willing to take this medication every day.  Discussed that she should take this medication regularly and return in 4 to 6 weeks for TSH in order to adjust medication dose as needed

## 2018-03-10 NOTE — Assessment & Plan Note (Signed)
Resolved.  Weight has been stable for the last 3 to 4 months.  She currently weighs 141 pounds and weighed 139 on 11/21/2017.  She does not have any B symptoms and other than her Pap smear is up-to-date on regular cancer screening.  Patient is noncompliant on her thyroid medication which may impact her weight.  She does have adequate caloric intake.

## 2018-03-10 NOTE — Patient Instructions (Addendum)
Restart hydrochlorothiazide 12.5mg  daily.  Take your thyroid medication every day and come back in 1 month to recheck bloodwork.   You are due for a pap smear. We can do this at your next visit.

## 2018-06-02 ENCOUNTER — Ambulatory Visit: Payer: Medicaid Other | Admitting: Family Medicine

## 2018-07-03 ENCOUNTER — Ambulatory Visit: Payer: Medicaid Other | Admitting: Family Medicine

## 2018-09-02 ENCOUNTER — Ambulatory Visit (INDEPENDENT_AMBULATORY_CARE_PROVIDER_SITE_OTHER): Payer: Medicare Other | Admitting: *Deleted

## 2018-09-02 DIAGNOSIS — Z23 Encounter for immunization: Secondary | ICD-10-CM | POA: Diagnosis not present

## 2019-04-02 ENCOUNTER — Ambulatory Visit: Payer: Medicaid Other | Admitting: Family Medicine

## 2019-04-02 ENCOUNTER — Ambulatory Visit (INDEPENDENT_AMBULATORY_CARE_PROVIDER_SITE_OTHER): Payer: Medicare Other | Admitting: Family Medicine

## 2019-04-02 ENCOUNTER — Other Ambulatory Visit: Payer: Self-pay

## 2019-04-02 VITALS — BP 120/84 | HR 70 | Wt 144.6 lb

## 2019-04-02 DIAGNOSIS — I10 Essential (primary) hypertension: Secondary | ICD-10-CM

## 2019-04-02 DIAGNOSIS — R600 Localized edema: Secondary | ICD-10-CM

## 2019-04-02 DIAGNOSIS — E890 Postprocedural hypothyroidism: Secondary | ICD-10-CM | POA: Diagnosis not present

## 2019-04-02 NOTE — Patient Instructions (Addendum)
It was nice meeting you today Ms. Dixson!  We are getting some labs today to investigate your swelling.  I think that your thyroid level is very likely low, so please start taking her thyroid medicine 1 hour before your first meal of the day tomorrow morning.  Keep your legs up as much as possible and reduce your salt intake to reduce your swelling.  If you have any questions or concerns, please feel free to call the clinic.   Be well,  Dr. Shan Levans

## 2019-04-02 NOTE — Progress Notes (Addendum)
   Subjective:    Brya Simerly - 58 y.o. female MRN [id number]  Date of birth: [birth year]  CC:  Nasha Diss is here for bilateral ankle swelling.  HPI: Bilateral ankle swelling Started about a week ago No exacerbating or alleviating factors noted No shortness of breath except when walking at an increased pace No known heart issues Has not been taking her Synthroid for several months Says that she has been taking a cream that she applies to her legs that was advertised on TV, which she thinks can substitute for her thyroid medicine Says that her weight will fluctuate but no known weight trend currently  Health Maintenance:  Health Maintenance Due  Topic Date Due  . PAP SMEAR-Modifier  12/07/2017  . INFLUENZA VACCINE  03/28/2019    -  reports that she has been smoking cigarettes. She has been smoking about 0.25 packs per day. She has never used smokeless tobacco. - Review of Systems: Per HPI. - Past Medical History: Patient Active Problem List   Diagnosis Date Noted  . Abnormal mammogram 11/29/2017  . Loss of weight 11/22/2017  . Dry skin 11/22/2017  . Hx of adenomatous polyp of colon 05/26/2017  . Bilateral leg cramps 09/03/2016  . Hypothyroidism 11/02/2014  . Scotoma 01/01/2014  . GERD (gastroesophageal reflux disease) 04/04/2011  . Hypertension, benign 01/03/2011  . Hyperlipidemia 06/06/2010  . TOBACCO USER 06/06/2010  . Depressive disorder 06/06/2010  . LEARNING DISABILITY 06/06/2010  . HOT FLASHES 06/06/2010   - Medications: reviewed and updated   Objective:   Physical Exam BP 120/84   Pulse 70   Wt 144 lb 9.6 oz (65.6 kg)   SpO2 92%   BMI 27.32 kg/m  Gen: NAD, alert, cooperative with exam, appears older than stated age CV: RRR, good S1/S2, no murmur, 1+ pitting pedal edema bilaterally Resp: Crackles heard in bilateral bases, no wheezing, no respiratory distress Psych: Poor insight and judgment  Assessment & Plan:   Hypothyroidism Likely  currently not well controlled.  Last TSH about 1-1/2 years ago was 34.  Weight is only up 3 pounds compared to 1 year ago, which is reassuring.  Patient cannot tell me how long she has not been taking her Synthroid, but it has been at least several months.  At her last appointment about 1 year ago, she was also noted to be noncompliant with this medication.  Patient was counseled extensively on the importance of keeping her thyroid level within normal limits.  She was told that this could be leading to her ankle edema.  We will obtain a TSH today as well as a BMP and a BNP.  Would like to get a CXR due to the crackles heard on auscultation, but patient read the bus and cannot easily get this performed.  Patient was counseled to restart her Synthroid tomorrow morning since she has several pills at home.  This will likely need to be refilled once her TSH returns.    Maia Breslow, M.D. 04/03/2019, 9:30 AM PGY-3, [place]

## 2019-04-03 ENCOUNTER — Telehealth: Payer: Self-pay | Admitting: *Deleted

## 2019-04-03 ENCOUNTER — Other Ambulatory Visit: Payer: Self-pay | Admitting: Family Medicine

## 2019-04-03 DIAGNOSIS — E039 Hypothyroidism, unspecified: Secondary | ICD-10-CM

## 2019-04-03 LAB — BASIC METABOLIC PANEL
BUN/Creatinine Ratio: 7 — ABNORMAL LOW (ref 9–23)
BUN: 11 mg/dL (ref 6–24)
CO2: 28 mmol/L (ref 20–29)
Calcium: 9.4 mg/dL (ref 8.7–10.2)
Chloride: 99 mmol/L (ref 96–106)
Creatinine, Ser: 1.54 mg/dL — ABNORMAL HIGH (ref 0.57–1.00)
GFR calc Af Amer: 42 mL/min/{1.73_m2} — ABNORMAL LOW (ref >59)
GFR calc non Af Amer: 37 mL/min/{1.73_m2} — ABNORMAL LOW (ref >59)
Glucose: 128 mg/dL — ABNORMAL HIGH (ref 65–99)
Potassium: 3.7 mmol/L (ref 3.5–5.2)
Sodium: 142 mmol/L (ref 134–144)

## 2019-04-03 LAB — BRAIN NATRIURETIC PEPTIDE: BNP: 28.2 pg/mL (ref 0.0–100.0)

## 2019-04-03 LAB — TSH: TSH: 121 u[IU]/mL — ABNORMAL HIGH (ref 0.450–4.500)

## 2019-04-03 MED ORDER — LEVOTHYROXINE SODIUM 175 MCG PO TABS: 175.0000 ug | ORAL_TABLET | Freq: Every day | ORAL | 3 refills | Status: DC

## 2019-04-03 NOTE — Assessment & Plan Note (Addendum)
Likely currently not well controlled.  Last TSH about 1-1/2 years ago was 34.  Weight is only up 3 pounds compared to 1 year ago, which is reassuring.  Patient cannot tell me how long she has not been taking her Synthroid, but it has been at least several months.  At her last appointment about 1 year ago, she was also noted to be noncompliant with this medication.  Patient was counseled extensively on the importance of keeping her thyroid level within normal limits.  She was told that this could be leading to her ankle edema.  We will obtain a TSH today as well as a BMP and a BNP.  Would like to get a CXR due to the crackles heard on auscultation, but patient read the bus and cannot easily get this performed.  Patient was counseled to restart her Synthroid tomorrow morning since she has several pills at home.  This will likely need to be refilled once her TSH returns.

## 2019-04-03 NOTE — Telephone Encounter (Signed)
-----   Message from Kathrene Alu, MD sent at 04/03/2019  4:39 PM EDT ----- Regarding: appointment for patient Would you schedule an appointment for Brooke Rivera in about 4 to 6 weeks to check on how she is taking her Synthroid and to check her TSH?  This can be with me or her PCP, Dr. Ky Barban, or with any other provider.  Please call her and let her know when this is.  Thanks.

## 2019-04-06 NOTE — Telephone Encounter (Signed)
LVM to call office back to assist her in getting an appointment scheduled. It would need to be scheduled between 8/31-9/18 per the note from Dr. Shan Levans. Kinzley Savell Zimmerman Rumple, CMA

## 2019-04-06 NOTE — Telephone Encounter (Addendum)
Pt is scheduled for an appointment on 04/27/2019 with PCP.Ancil Dewan Zimmerman Rumple, CMA

## 2019-04-27 ENCOUNTER — Ambulatory Visit: Payer: Medicaid Other | Admitting: Family Medicine

## 2019-04-27 NOTE — Progress Notes (Deleted)
  Subjective:   Patient ID: Brooke Rivera    DOB: 1960/11/19, 58 y.o. female   MRN: [id number]  Calais Ayars is a 58 y.o. 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 - Medications: Synthroid 141mcg - most recent TSH 121 04/02/19 - Current symptoms:  {$Symptoms; thyroid:3523607313} - Denies {$Symptoms; thyroid:3523607313} - Symptoms have {$Desc; symptom [phone]}  Hypertension: - Medications: HCTZ 12.5mg *** - Compliance: *** - Checking BP at home: *** - Denies any SOB, CP, vision changes, LE edema, medication SEs, or symptoms of hypotension - Diet: *** - Exercise: ***  Healthcare Maintenance - Vaccines: influenza - Colonoscopy: due 2023 - Mammogram: due 2021 - Pap Smear: due 2021 - DEXA Scan: *** - A1c: *** - Lipid Panel: ***  ***dexa scan ***taking synthroid?? ***albuterol?  Review of Systems:  Per HPI.  [place], medications and smoking status reviewed.  Objective:   There were no vitals taken for this visit. Vitals and nursing note reviewed.  General: well nourished, well developed, in no acute distress with non-toxic appearance HEENT: normocephalic, atraumatic, moist mucous membranes Neck: supple, non-tender without lymphadenopathy CV: regular rate and rhythm without murmurs, rubs, or gallops, no lower extremity edema Lungs: clear to auscultation bilaterally with normal work of breathing Abdomen: soft, non-tender, non-distended, no masses or organomegaly palpable, normoactive bowel sounds Skin: warm, dry, no rashes or lesions Extremities: warm and well perfused, normal tone MSK: ROM grossly intact, strength intact, gait normal Neuro: Alert and oriented, speech normal  Assessment & Plan:   No problem-specific Assessment & Plan notes found for this encounter.  No orders of the defined types were placed  in this encounter.  No orders of the defined types were placed in this encounter.   Rory Percy, DO PGY-3, [facility] 04/27/2019 8:03 AM

## 2019-05-17 ENCOUNTER — Emergency Department (HOSPITAL_COMMUNITY): Payer: Medicare Other

## 2019-05-17 ENCOUNTER — Inpatient Hospital Stay (HOSPITAL_COMMUNITY)
Admission: EM | Admit: 2019-05-17 | Discharge: 2019-05-21 | DRG: 246 | Disposition: A | Discharge status: Home / Self Care | Payer: Medicare Other | Attending: Cardiovascular Disease | Admitting: Cardiovascular Disease

## 2019-05-17 ENCOUNTER — Inpatient Hospital Stay (HOSPITAL_COMMUNITY)
Admission: EM | Disposition: A | Discharge status: Home / Self Care | Payer: Self-pay | Source: Home / Self Care | Attending: Cardiovascular Disease

## 2019-05-17 ENCOUNTER — Encounter (HOSPITAL_COMMUNITY): Payer: Self-pay | Admitting: *Deleted

## 2019-05-17 ENCOUNTER — Other Ambulatory Visit: Payer: Self-pay

## 2019-05-17 DIAGNOSIS — Z79899 Other long term (current) drug therapy: Secondary | ICD-10-CM

## 2019-05-17 DIAGNOSIS — J9601 Acute respiratory failure with hypoxia: Secondary | ICD-10-CM

## 2019-05-17 DIAGNOSIS — I4891 Unspecified atrial fibrillation: Secondary | ICD-10-CM | POA: Diagnosis present

## 2019-05-17 DIAGNOSIS — I462 Cardiac arrest due to underlying cardiac condition: Secondary | ICD-10-CM | POA: Diagnosis present

## 2019-05-17 DIAGNOSIS — I110 Hypertensive heart disease with heart failure: Secondary | ICD-10-CM | POA: Diagnosis present

## 2019-05-17 DIAGNOSIS — I5021 Acute systolic (congestive) heart failure: Secondary | ICD-10-CM

## 2019-05-17 DIAGNOSIS — Z8349 Family history of other endocrine, nutritional and metabolic diseases: Secondary | ICD-10-CM

## 2019-05-17 DIAGNOSIS — E049 Nontoxic goiter, unspecified: Secondary | ICD-10-CM | POA: Diagnosis present

## 2019-05-17 DIAGNOSIS — Z23 Encounter for immunization: Secondary | ICD-10-CM | POA: Diagnosis not present

## 2019-05-17 DIAGNOSIS — I2111 ST elevation (STEMI) myocardial infarction involving right coronary artery: Secondary | ICD-10-CM | POA: Diagnosis present

## 2019-05-17 DIAGNOSIS — Z79890 Hormone replacement therapy (postmenopausal): Secondary | ICD-10-CM

## 2019-05-17 DIAGNOSIS — E785 Hyperlipidemia, unspecified: Secondary | ICD-10-CM | POA: Diagnosis present

## 2019-05-17 DIAGNOSIS — Z7982 Long term (current) use of aspirin: Secondary | ICD-10-CM | POA: Diagnosis not present

## 2019-05-17 DIAGNOSIS — F1410 Cocaine abuse, uncomplicated: Secondary | ICD-10-CM | POA: Diagnosis present

## 2019-05-17 DIAGNOSIS — I219 Acute myocardial infarction, unspecified: Secondary | ICD-10-CM

## 2019-05-17 DIAGNOSIS — I4901 Ventricular fibrillation: Secondary | ICD-10-CM | POA: Diagnosis not present

## 2019-05-17 DIAGNOSIS — I2582 Chronic total occlusion of coronary artery: Secondary | ICD-10-CM | POA: Diagnosis present

## 2019-05-17 DIAGNOSIS — E039 Hypothyroidism, unspecified: Secondary | ICD-10-CM | POA: Diagnosis present

## 2019-05-17 DIAGNOSIS — F17210 Nicotine dependence, cigarettes, uncomplicated: Secondary | ICD-10-CM | POA: Diagnosis present

## 2019-05-17 DIAGNOSIS — I2510 Atherosclerotic heart disease of native coronary artery without angina pectoris: Secondary | ICD-10-CM | POA: Diagnosis present

## 2019-05-17 DIAGNOSIS — K219 Gastro-esophageal reflux disease without esophagitis: Secondary | ICD-10-CM | POA: Diagnosis present

## 2019-05-17 DIAGNOSIS — I213 ST elevation (STEMI) myocardial infarction of unspecified site: Secondary | ICD-10-CM | POA: Diagnosis present

## 2019-05-17 DIAGNOSIS — Z4682 Encounter for fitting and adjustment of non-vascular catheter: Secondary | ICD-10-CM | POA: Diagnosis not present

## 2019-05-17 DIAGNOSIS — I472 Ventricular tachycardia: Secondary | ICD-10-CM | POA: Diagnosis present

## 2019-05-17 DIAGNOSIS — I10 Essential (primary) hypertension: Secondary | ICD-10-CM | POA: Diagnosis present

## 2019-05-17 DIAGNOSIS — J9600 Acute respiratory failure, unspecified whether with hypoxia or hypercapnia: Secondary | ICD-10-CM | POA: Diagnosis not present

## 2019-05-17 DIAGNOSIS — Z955 Presence of coronary angioplasty implant and graft: Secondary | ICD-10-CM | POA: Diagnosis not present

## 2019-05-17 DIAGNOSIS — E890 Postprocedural hypothyroidism: Secondary | ICD-10-CM | POA: Diagnosis present

## 2019-05-17 DIAGNOSIS — Z20828 Contact with and (suspected) exposure to other viral communicable diseases: Secondary | ICD-10-CM | POA: Diagnosis present

## 2019-05-17 DIAGNOSIS — I441 Atrioventricular block, second degree: Secondary | ICD-10-CM | POA: Diagnosis not present

## 2019-05-17 DIAGNOSIS — G9340 Encephalopathy, unspecified: Secondary | ICD-10-CM

## 2019-05-17 DIAGNOSIS — I469 Cardiac arrest, cause unspecified: Secondary | ICD-10-CM | POA: Diagnosis not present

## 2019-05-17 DIAGNOSIS — J9690 Respiratory failure, unspecified, unspecified whether with hypoxia or hypercapnia: Secondary | ICD-10-CM | POA: Diagnosis not present

## 2019-05-17 DIAGNOSIS — E782 Mixed hyperlipidemia: Secondary | ICD-10-CM | POA: Diagnosis not present

## 2019-05-17 DIAGNOSIS — I25119 Atherosclerotic heart disease of native coronary artery with unspecified angina pectoris: Secondary | ICD-10-CM | POA: Diagnosis not present

## 2019-05-17 DIAGNOSIS — I959 Hypotension, unspecified: Secondary | ICD-10-CM | POA: Diagnosis not present

## 2019-05-17 DIAGNOSIS — I361 Nonrheumatic tricuspid (valve) insufficiency: Secondary | ICD-10-CM | POA: Diagnosis not present

## 2019-05-17 DIAGNOSIS — R319 Hematuria, unspecified: Secondary | ICD-10-CM

## 2019-05-17 HISTORY — DX: Cocaine use, unspecified, uncomplicated: F14.90

## 2019-05-17 HISTORY — PX: LEFT HEART CATH AND CORONARY ANGIOGRAPHY: CATH118249

## 2019-05-17 HISTORY — PX: CORONARY/GRAFT ACUTE MI REVASCULARIZATION: CATH118305

## 2019-05-17 HISTORY — DX: Unspecified systolic (congestive) heart failure: I50.20

## 2019-05-17 HISTORY — DX: ST elevation (STEMI) myocardial infarction of unspecified site: I21.3

## 2019-05-17 LAB — I-STAT CHEM 8, ED
BUN: 14 mg/dL (ref 6–20)
Calcium, Ion: 1.02 mmol/L — ABNORMAL LOW (ref 1.15–1.40)
Chloride: 102 mmol/L (ref 98–111)
Creatinine, Ser: 0.7 mg/dL (ref 0.44–1.00)
Glucose, Bld: 105 mg/dL — ABNORMAL HIGH (ref 70–99)
HCT: 43 % (ref 36.0–46.0)
Hemoglobin: 14.6 g/dL (ref 12.0–15.0)
Potassium: 4.1 mmol/L (ref 3.5–5.1)
Sodium: 137 mmol/L (ref 135–145)
TCO2: 24 mmol/L (ref 22–32)

## 2019-05-17 LAB — SARS CORONAVIRUS 2 BY RT PCR (HOSPITAL ORDER, PERFORMED IN ~~LOC~~ HOSPITAL LAB): SARS Coronavirus 2: NEGATIVE

## 2019-05-17 LAB — POCT I-STAT 7, (LYTES, BLD GAS, ICA,H+H)
Acid-base deficit: 2 mmol/L (ref 0.0–2.0)
Bicarbonate: 22.9 mmol/L (ref 20.0–28.0)
Calcium, Ion: 1.13 mmol/L — ABNORMAL LOW (ref 1.15–1.40)
HCT: 36 % (ref 36.0–46.0)
Hemoglobin: 12.2 g/dL (ref 12.0–15.0)
O2 Saturation: 99 %
Patient temperature: 98.1
Potassium: 3.5 mmol/L (ref 3.5–5.1)
Sodium: 138 mmol/L (ref 135–145)
TCO2: 24 mmol/L (ref 22–32)
pCO2 arterial: 37.6 mmHg (ref 32.0–48.0)
pH, Arterial: 7.392 (ref 7.350–7.450)
pO2, Arterial: 144 mmHg — ABNORMAL HIGH (ref 83.0–108.0)

## 2019-05-17 LAB — CBC
HCT: 41.2 % (ref 36.0–46.0)
Hemoglobin: 12.9 g/dL (ref 12.0–15.0)
MCH: 25 pg — ABNORMAL LOW (ref 26.0–34.0)
MCHC: 31.3 g/dL (ref 30.0–36.0)
MCV: 79.8 fL — ABNORMAL LOW (ref 80.0–100.0)
Platelets: 324 10*3/uL (ref 150–400)
RBC: 5.16 MIL/uL — ABNORMAL HIGH (ref 3.87–5.11)
RDW: 15.3 % (ref 11.5–15.5)
WBC: 8.3 10*3/uL (ref 4.0–10.5)
nRBC: 0 % (ref 0.0–0.2)

## 2019-05-17 LAB — BASIC METABOLIC PANEL
Anion gap: 13 (ref 5–15)
BUN: 12 mg/dL (ref 6–20)
CO2: 22 mmol/L (ref 22–32)
Calcium: 9.2 mg/dL (ref 8.9–10.3)
Chloride: 102 mmol/L (ref 98–111)
Creatinine, Ser: 0.85 mg/dL (ref 0.44–1.00)
GFR calc Af Amer: >60 mL/min (ref >60)
GFR calc non Af Amer: >60 mL/min (ref >60)
Glucose, Bld: 111 mg/dL — ABNORMAL HIGH (ref 70–99)
Potassium: 3.6 mmol/L (ref 3.5–5.1)
Sodium: 137 mmol/L (ref 135–145)

## 2019-05-17 LAB — TROPONIN I (HIGH SENSITIVITY)
Troponin I (High Sensitivity): 154 ng/L (ref <18)
Troponin I (High Sensitivity): 9016 ng/L (ref <18)
Troponin I (High Sensitivity): >27000 ng/L (ref <18)

## 2019-05-17 LAB — I-STAT BETA HCG BLOOD, ED (MC, WL, AP ONLY): I-stat hCG, quantitative: 5.1 m[IU]/mL — ABNORMAL HIGH (ref <5)

## 2019-05-17 LAB — MRSA PCR SCREENING: MRSA by PCR: NEGATIVE

## 2019-05-17 SURGERY — CORONARY/GRAFT ACUTE MI REVASCULARIZATION
Anesthesia: LOCAL

## 2019-05-17 MED ORDER — SODIUM CHLORIDE 0.9 % IV SOLN
INTRAVENOUS | Status: AC
  Administered 2019-05-17: 22:00:00 via INTRAVENOUS

## 2019-05-17 MED ORDER — SODIUM CHLORIDE 0.9% FLUSH
3.0000 mL | Freq: Two times a day (BID) | INTRAVENOUS | Status: DC
  Administered 2019-05-18 – 2019-05-21 (×7): 3 mL via INTRAVENOUS

## 2019-05-17 MED ORDER — MIDAZOLAM HCL 2 MG/2ML IJ SOLN
INTRAMUSCULAR | Status: DC | PRN
  Administered 2019-05-17: 1 mg via INTRAVENOUS

## 2019-05-17 MED ORDER — VERAPAMIL HCL 2.5 MG/ML IV SOLN
INTRAVENOUS | Status: AC
  Filled 2019-05-17: qty 2

## 2019-05-17 MED ORDER — TICAGRELOR 90 MG PO TABS
90.0000 mg | ORAL_TABLET | Freq: Two times a day (BID) | ORAL | Status: DC
  Administered 2019-05-18 – 2019-05-21 (×7): 90 mg via ORAL
  Filled 2019-05-17 (×7): qty 1

## 2019-05-17 MED ORDER — ROCURONIUM BROMIDE 50 MG/5ML IV SOLN
INTRAVENOUS | Status: AC | PRN
  Administered 2019-05-17: 80 mg via INTRAVENOUS

## 2019-05-17 MED ORDER — AMIODARONE HCL IN DEXTROSE 360-4.14 MG/200ML-% IV SOLN
INTRAVENOUS | Status: AC
  Filled 2019-05-17: qty 200

## 2019-05-17 MED ORDER — HEPARIN (PORCINE) IN NACL 1000-0.9 UT/500ML-% IV SOLN
INTRAVENOUS | Status: AC
  Filled 2019-05-17: qty 1000

## 2019-05-17 MED ORDER — SODIUM CHLORIDE 0.9% FLUSH: 3.0000 mL | Freq: Once | INTRAVENOUS | Status: DC

## 2019-05-17 MED ORDER — NITROGLYCERIN 0.4 MG SL SUBL: 0.4000 mg | SUBLINGUAL_TABLET | SUBLINGUAL | Status: DC | PRN

## 2019-05-17 MED ORDER — AMIODARONE LOAD VIA INFUSION
150.0000 mg | Freq: Once | INTRAVENOUS | Status: DC
  Filled 2019-05-17: qty 83.34

## 2019-05-17 MED ORDER — LEVOTHYROXINE SODIUM 75 MCG PO TABS: 175.0000 ug | ORAL_TABLET | Freq: Every day | ORAL | Status: DC

## 2019-05-17 MED ORDER — SODIUM CHLORIDE 0.9 % IV SOLN
4.0000 ug/kg/min | INTRAVENOUS | Status: DC
  Filled 2019-05-17: qty 50

## 2019-05-17 MED ORDER — ETOMIDATE 2 MG/ML IV SOLN
INTRAVENOUS | Status: AC | PRN
  Administered 2019-05-17: 15 mg via INTRAVENOUS

## 2019-05-17 MED ORDER — SODIUM CHLORIDE 0.9 % IV SOLN: INTRAVENOUS | Status: DC

## 2019-05-17 MED ORDER — LABETALOL HCL 5 MG/ML IV SOLN: 10.0000 mg | INTRAVENOUS | Status: AC | PRN

## 2019-05-17 MED ORDER — HEPARIN SODIUM (PORCINE) 5000 UNIT/ML IJ SOLN
4000.0000 [IU] | Freq: Once | INTRAMUSCULAR | Status: AC
  Administered 2019-05-17: 4000 [IU] via INTRAVENOUS

## 2019-05-17 MED ORDER — MIDAZOLAM HCL 2 MG/2ML IJ SOLN
2.0000 mg | INTRAMUSCULAR | Status: DC | PRN
  Administered 2019-05-18: 2 mg via INTRAVENOUS
  Filled 2019-05-17: qty 2

## 2019-05-17 MED ORDER — NOREPINEPHRINE BITARTRATE 1 MG/ML IV SOLN
INTRAVENOUS | Status: DC | PRN
  Administered 2019-05-17: 5 ug/min via INTRAVENOUS

## 2019-05-17 MED ORDER — ATORVASTATIN CALCIUM 40 MG PO TABS: 40.0000 mg | ORAL_TABLET | Freq: Every day | ORAL | Status: DC

## 2019-05-17 MED ORDER — HEPARIN (PORCINE) IN NACL 1000-0.9 UT/500ML-% IV SOLN
INTRAVENOUS | Status: AC
  Filled 2019-05-17: qty 1500

## 2019-05-17 MED ORDER — FENTANYL BOLUS VIA INFUSION
50.0000 ug | INTRAVENOUS | Status: DC | PRN
  Filled 2019-05-17: qty 50

## 2019-05-17 MED ORDER — FENTANYL CITRATE (PF) 100 MCG/2ML IJ SOLN
INTRAMUSCULAR | Status: DC | PRN
  Administered 2019-05-17: 25 ug via INTRAVENOUS

## 2019-05-17 MED ORDER — AMIODARONE HCL IN DEXTROSE 360-4.14 MG/200ML-% IV SOLN
30.0000 mg/h | INTRAVENOUS | Status: DC
  Administered 2019-05-17: 30 mg/h via INTRAVENOUS

## 2019-05-17 MED ORDER — HEPARIN SODIUM (PORCINE) 1000 UNIT/ML IJ SOLN
INTRAMUSCULAR | Status: AC
  Filled 2019-05-17: qty 1

## 2019-05-17 MED ORDER — PROPOFOL 1000 MG/100ML IV EMUL: 0.0000 ug/kg/min | INTRAVENOUS | Status: DC

## 2019-05-17 MED ORDER — CHLORHEXIDINE GLUCONATE 0.12% ORAL RINSE (MEDLINE KIT)
15.0000 mL | Freq: Two times a day (BID) | OROMUCOSAL | Status: DC
  Administered 2019-05-17 – 2019-05-18 (×2): 15 mL via OROMUCOSAL

## 2019-05-17 MED ORDER — NITROGLYCERIN 1 MG/10 ML FOR IR/CATH LAB
INTRA_ARTERIAL | Status: AC
  Filled 2019-05-17: qty 10

## 2019-05-17 MED ORDER — ASPIRIN 81 MG PO CHEW
81.0000 mg | CHEWABLE_TABLET | Freq: Every day | ORAL | Status: DC
  Administered 2019-05-17 – 2019-05-21 (×5): 81 mg via ORAL
  Filled 2019-05-17 (×5): qty 1

## 2019-05-17 MED ORDER — FENTANYL 2500MCG IN NS 250ML (10MCG/ML) PREMIX INFUSION
25.0000 ug/h | INTRAVENOUS | Status: DC
  Administered 2019-05-17: 50 ug/h via INTRAVENOUS
  Filled 2019-05-17: qty 250

## 2019-05-17 MED ORDER — ACETAMINOPHEN 325 MG PO TABS: 650.0000 mg | ORAL_TABLET | Freq: Four times a day (QID) | ORAL | Status: DC | PRN

## 2019-05-17 MED ORDER — BIVALIRUDIN TRIFLUOROACETATE 250 MG IV SOLR
INTRAVENOUS | Status: AC
  Filled 2019-05-17: qty 250

## 2019-05-17 MED ORDER — ALBUTEROL SULFATE (2.5 MG/3ML) 0.083% IN NEBU: 2.5000 mg | INHALATION_SOLUTION | RESPIRATORY_TRACT | Status: DC | PRN

## 2019-05-17 MED ORDER — CANGRELOR TETRASODIUM 50 MG IV SOLR
INTRAVENOUS | Status: AC
  Filled 2019-05-17: qty 50

## 2019-05-17 MED ORDER — SODIUM CHLORIDE 0.9 % IV SOLN
INTRAVENOUS | Status: AC
  Administered 2019-05-18 (×2): via INTRAVENOUS

## 2019-05-17 MED ORDER — LIDOCAINE HCL (PF) 1 % IJ SOLN
INTRAMUSCULAR | Status: AC
  Filled 2019-05-17: qty 30

## 2019-05-17 MED ORDER — ORAL CARE MOUTH RINSE: 15.0000 mL | OROMUCOSAL | Status: DC

## 2019-05-17 MED ORDER — SODIUM CHLORIDE 0.9 % IV SOLN
INTRAVENOUS | Status: AC | PRN
  Administered 2019-05-17 (×2): 1.75 mg/kg/h via INTRAVENOUS

## 2019-05-17 MED ORDER — HEPARIN (PORCINE) IN NACL 1000-0.9 UT/500ML-% IV SOLN
INTRAVENOUS | Status: DC | PRN
  Administered 2019-05-17 (×3): 500 mL

## 2019-05-17 MED ORDER — ORAL CARE MOUTH RINSE
15.0000 mL | OROMUCOSAL | Status: DC
  Administered 2019-05-17 – 2019-05-18 (×5): 15 mL via OROMUCOSAL

## 2019-05-17 MED ORDER — ONDANSETRON HCL 4 MG/2ML IJ SOLN
4.0000 mg | Freq: Four times a day (QID) | INTRAMUSCULAR | Status: DC | PRN
  Administered 2019-05-18: 4 mg via INTRAVENOUS
  Filled 2019-05-17: qty 2

## 2019-05-17 MED ORDER — HYDRALAZINE HCL 20 MG/ML IJ SOLN
10.0000 mg | INTRAMUSCULAR | Status: AC | PRN
  Administered 2019-05-18: 10 mg via INTRAVENOUS
  Filled 2019-05-17: qty 1

## 2019-05-17 MED ORDER — SODIUM CHLORIDE 0.9 % IV SOLN: 250.0000 mL | INTRAVENOUS | Status: DC | PRN

## 2019-05-17 MED ORDER — SODIUM CHLORIDE 0.9 % IV SOLN
INTRAVENOUS | Status: AC | PRN
  Administered 2019-05-17: 1000 mL via INTRAVENOUS

## 2019-05-17 MED ORDER — PNEUMOCOCCAL VAC POLYVALENT 25 MCG/0.5ML IJ INJ
0.5000 mL | INJECTION | INTRAMUSCULAR | Status: AC
  Administered 2019-05-21: 14:00:00 0.5 mL via INTRAMUSCULAR
  Filled 2019-05-17 (×2): qty 0.5

## 2019-05-17 MED ORDER — PANTOPRAZOLE SODIUM 40 MG IV SOLR
40.0000 mg | Freq: Every day | INTRAVENOUS | Status: DC
  Administered 2019-05-18: 40 mg via INTRAVENOUS
  Filled 2019-05-17: qty 40

## 2019-05-17 MED ORDER — SODIUM CHLORIDE 0.9 % IV SOLN
INTRAVENOUS | Status: AC | PRN
  Administered 2019-05-17: 4 ug/kg/min via INTRAVENOUS

## 2019-05-17 MED ORDER — ONDANSETRON HCL 4 MG/2ML IJ SOLN: 4.0000 mg | Freq: Four times a day (QID) | INTRAMUSCULAR | Status: DC | PRN

## 2019-05-17 MED ORDER — MIDAZOLAM HCL 2 MG/2ML IJ SOLN
INTRAMUSCULAR | Status: AC
  Filled 2019-05-17: qty 2

## 2019-05-17 MED ORDER — LIDOCAINE HCL (PF) 1 % IJ SOLN
INTRAMUSCULAR | Status: DC | PRN
  Administered 2019-05-17: 18 mL via SUBCUTANEOUS

## 2019-05-17 MED ORDER — LEVOTHYROXINE SODIUM 50 MCG PO TABS
50.0000 ug | ORAL_TABLET | Freq: Every day | ORAL | Status: DC
  Administered 2019-05-18 – 2019-05-21 (×4): 50 ug
  Filled 2019-05-17 (×4): qty 1

## 2019-05-17 MED ORDER — FENTANYL CITRATE (PF) 100 MCG/2ML IJ SOLN
INTRAMUSCULAR | Status: AC
  Filled 2019-05-17: qty 2

## 2019-05-17 MED ORDER — MIDAZOLAM HCL 2 MG/2ML IJ SOLN: 2.0000 mg | INTRAMUSCULAR | Status: DC | PRN

## 2019-05-17 MED ORDER — IOHEXOL 350 MG/ML SOLN
INTRAVENOUS | Status: DC | PRN
  Administered 2019-05-17: 135 mL via INTRA_ARTERIAL

## 2019-05-17 MED ORDER — NITROGLYCERIN 1 MG/10 ML FOR IR/CATH LAB
INTRA_ARTERIAL | Status: DC | PRN
  Administered 2019-05-17 (×2): 100 ug via INTRACORONARY

## 2019-05-17 MED ORDER — FENTANYL 2500MCG IN NS 250ML (10MCG/ML) PREMIX INFUSION: 50.0000 ug/h | INTRAVENOUS | Status: DC

## 2019-05-17 MED ORDER — AMIODARONE LOAD VIA INFUSION
INTRAVENOUS | Status: DC | PRN
  Administered 2019-05-17: 150 mg via INTRAVENOUS

## 2019-05-17 MED ORDER — AMIODARONE HCL IN DEXTROSE 360-4.14 MG/200ML-% IV SOLN
60.0000 mg/h | INTRAVENOUS | Status: DC
  Administered 2019-05-17 (×3): 60 mg/h via INTRAVENOUS
  Filled 2019-05-17: qty 200

## 2019-05-17 MED ORDER — NOREPINEPHRINE 4 MG/250ML-% IV SOLN
INTRAVENOUS | Status: AC
  Filled 2019-05-17: qty 250

## 2019-05-17 MED ORDER — EPINEPHRINE 1 MG/10ML IJ SOSY
PREFILLED_SYRINGE | INTRAMUSCULAR | Status: AC | PRN
  Administered 2019-05-17: 1 via INTRAVENOUS

## 2019-05-17 MED ORDER — INFLUENZA VAC SPLIT QUAD 0.5 ML IM SUSY
0.5000 mL | PREFILLED_SYRINGE | INTRAMUSCULAR | Status: AC
  Administered 2019-05-21: 0.5 mL via INTRAMUSCULAR
  Filled 2019-05-17 (×2): qty 0.5

## 2019-05-17 MED ORDER — BIVALIRUDIN BOLUS VIA INFUSION - CUPID
INTRAVENOUS | Status: DC | PRN
  Administered 2019-05-17: 18:00:00 49.2 mg via INTRAVENOUS

## 2019-05-17 MED ORDER — ACETAMINOPHEN 325 MG PO TABS
650.0000 mg | ORAL_TABLET | ORAL | Status: DC | PRN
  Administered 2019-05-19: 650 mg via ORAL
  Filled 2019-05-17: qty 2

## 2019-05-17 MED ORDER — HEPARIN SODIUM (PORCINE) 5000 UNIT/ML IJ SOLN
INTRAMUSCULAR | Status: AC
  Filled 2019-05-17: qty 1

## 2019-05-17 MED ORDER — SODIUM CHLORIDE 0.9% FLUSH: 3.0000 mL | INTRAVENOUS | Status: DC | PRN

## 2019-05-17 MED ORDER — ATORVASTATIN CALCIUM 80 MG PO TABS
80.0000 mg | ORAL_TABLET | Freq: Every day | ORAL | Status: DC
  Administered 2019-05-18 – 2019-05-20 (×3): 80 mg via ORAL
  Filled 2019-05-17 (×3): qty 1

## 2019-05-17 MED ORDER — CHLORHEXIDINE GLUCONATE 0.12% ORAL RINSE (MEDLINE KIT): 15.0000 mL | Freq: Two times a day (BID) | OROMUCOSAL | Status: DC

## 2019-05-17 MED ORDER — CANGRELOR BOLUS VIA INFUSION
INTRAVENOUS | Status: DC | PRN
  Administered 2019-05-17: 18:00:00 1968 ug via INTRAVENOUS

## 2019-05-17 MED ORDER — ACETAMINOPHEN 325 MG PO TABS: 650.0000 mg | ORAL_TABLET | ORAL | Status: DC | PRN

## 2019-05-17 MED ORDER — CHLORHEXIDINE GLUCONATE CLOTH 2 % EX PADS
6.0000 | MEDICATED_PAD | Freq: Every day | CUTANEOUS | Status: DC
  Administered 2019-05-17 – 2019-05-18 (×2): 6 via TOPICAL

## 2019-05-17 MED ORDER — TICAGRELOR 90 MG PO TABS
180.0000 mg | ORAL_TABLET | Freq: Once | ORAL | Status: AC
  Administered 2019-05-17: 180 mg via ORAL
  Filled 2019-05-17: qty 2

## 2019-05-17 SURGICAL SUPPLY — 19 items
BALLN SAPPHIRE 2.0X15 (BALLOONS) ×2
BALLN SAPPHIRE ~~LOC~~ 2.5X15 (BALLOONS) ×2 IMPLANT
BALLOON SAPPHIRE 2.0X15 (BALLOONS) ×1 IMPLANT
CATH INFINITI 5FR MULTPACK ANG (CATHETERS) ×2 IMPLANT
CATH LAUNCHER 6FR JR4 (CATHETERS) ×2 IMPLANT
ELECT DEFIB PAD ADLT CADENCE (PAD) ×2 IMPLANT
GLIDESHEATH SLEND SS 6F .021 (SHEATH) ×2 IMPLANT
GUIDEWIRE INQWIRE 1.5J.035X260 (WIRE) ×1 IMPLANT
HOVERMATT SINGLE USE (MISCELLANEOUS) ×2 IMPLANT
INQWIRE 1.5J .035X260CM (WIRE) ×2
KIT ENCORE 26 ADVANTAGE (KITS) ×2 IMPLANT
KIT HEART LEFT (KITS) ×2 IMPLANT
PACK CARDIAC CATHETERIZATION (CUSTOM PROCEDURE TRAY) ×2 IMPLANT
SHEATH PINNACLE 6F 10CM (SHEATH) ×2 IMPLANT
STENT SYNERGY DES 2.25X20 (Permanent Stent) ×2 IMPLANT
TRANSDUCER W/STOPCOCK (MISCELLANEOUS) ×2 IMPLANT
TUBING CIL FLEX 10 FLL-RA (TUBING) ×2 IMPLANT
WIRE EMERALD 3MM-J .035X150CM (WIRE) ×2 IMPLANT
WIRE PT2 MS 185 (WIRE) ×2 IMPLANT

## 2019-05-17 NOTE — Progress Notes (Signed)
RT NOTE: RT transported patient from the cath lab to 2H01 with cath lab team. No complications and VS stable.  RT found tidal volume at 480 and noticed MD placed an order for tidal volume to be set at [place]. RT changed tidal volume to 380 to match MD orders. RT will obtain ABG between 30 mins to 1 hour.  RT will continue to monitor.

## 2019-05-17 NOTE — Progress Notes (Signed)
[facility] Progress Note Patient Name: Brooke Rivera DOB: Jan 20, 1961 MRN: [id number]   Date of Service  05/17/2019  HPI/Events of Note  ABG on 100%/PRVC 18/TV 380/P 5 = 7.392/37.6/144.0  eICU Interventions  Continue present ventilator management.      Intervention Category Major Interventions: Respiratory failure - evaluation and management  Charlene Cowdrey Eugene 05/17/2019, 9:05 PM

## 2019-05-17 NOTE — H&P (Addendum)
History & Physical    Patient ID: Kinnley Filyaw MRN: [id number], DOB/AGE: [birth year]   Admit date: 05/17/2019  Primary Care Provider: Rory Percy, DO Primary Cardiologist: New to [facility] - Dr. Claiborne Billings  Chief Complaint: Chest Pain  Patient Profile    Ynez Ilic is a 58 y.o. female with past medical history of HTN, HLD, and Hypothyroidism who presented to [facility] ED on 05/17/2019 for evaluation of chest pain and found to have a STEMI.   History of Present Illness    Ariea Azzara presented to [facility] ED on 05/17/2019 for evaluation of chest pain which started 1 hour prior to arrival with associated diaphoresis and vomiting. By provided history she did report using Cocaine yesterday.   Her initial EKG showed NSR, HR 77, with ST elevation along the inferior and anterior leads with reciprocal ST depression along the lateral leads. She was taken back to a room but while talking with nursing staff she became unresponsive and lost pulses. Was found to be in ventricular fibrillation and CPR was initiated (lasting 1-2 minutes) with her receiving 1 shock along with 1 dose of Epinephrine. Return rhythm was atrial fibrillation. Was intubated while in the ED. No family available at the beside to contribute to the history.   Labs show WBC 8.3, Hgb 12.9, platelets 324, Na+ 137, K+ 4.1, and creatinine 1.02. COVID negative. Troponin pending.   CODE STEMI was activated and she was taken for emergent cardiac catheterization.    Past Medical History:  Diagnosis Date  . Allergy    mild   . Depression    rx not taking at this time  . Goiter, lymphadenoid    thyroid  . Hx of adenomatous polyp of colon 05/26/2017  . Hyperlipidemia   . Hypertension     Past Surgical History:  Procedure Laterality Date  . COLONOSCOPY  04/2017   1 adenoma  . THYROIDECTOMY Bilateral 07/08/2015   Procedure: BILATERAL THYROIDECTOMY;  Surgeon: Ruby Cola, MD;  Location: [facility];  Service: ENT;   Laterality: Bilateral;     Medications Prior to Admission: Prior to Admission medications   Medication Sig Start Date End Date Taking? Authorizing Provider  albuterol (PROVENTIL HFA;VENTOLIN HFA) 108 (90 Base) MCG/ACT inhaler Inhale 2 puffs into the lungs every 6 (six) hours as needed for wheezing or shortness of breath. 10/10/17   Smiley Houseman, MD  aspirin EC 81 MG tablet Take 1 tablet (81 mg total) by mouth daily. 03/20/17   Smiley Houseman, MD  atorvastatin (LIPITOR) 40 MG tablet Take 1 tablet (40 mg total) by mouth daily. 03/10/18   Bufford Lope, DO  azelastine (ASTELIN) 0.1 % nasal spray Place 1 spray into both nostrils 2 (two) times daily. Use in each nostril as directed 10/10/17   Smiley Houseman, MD  hydrochlorothiazide (MICROZIDE) 12.5 MG capsule Take 1 capsule (12.5 mg total) by mouth daily. 03/10/18   Bufford Lope, DO  levothyroxine (SYNTHROID) 175 MCG tablet Take 1 tablet (175 mcg total) by mouth daily before breakfast. 04/03/19   Winfrey, Alcario Drought, MD     Allergies:    Allergies  Allergen Reactions  . Other Other (See Comments)    Redness face ,itching with cabbage    Social History:   Social History   Socioeconomic History  . Marital status: Single    Spouse name: Not on file  . Number of children: Not on file  . Years of education: Not on file  .  Highest education level: Not on file  Occupational History  . Not on file  Social Needs  . Financial resource strain: Not on file  . Food insecurity    Worry: Not on file    Inability: Not on file  . Transportation needs    Medical: Not on file    Non-medical: Not on file  Tobacco Use  . Smoking status: Current Every Day Smoker    Packs/day: 0.25    Types: Cigarettes  . Smokeless tobacco: Never Used  . Tobacco comment: 4-5 cigs a day   Substance and Sexual Activity  . Alcohol use: Yes    Alcohol/week: 0.0 standard drinks    Comment: last occurence 3 weeks ago  . Drug use: No  . Sexual activity: Not  on file  Lifestyle  . Physical activity    Days per week: Not on file    Minutes per session: Not on file  . Stress: Not on file  Relationships  . Social Herbalist on phone: Not on file    Gets together: Not on file    Attends religious service: Not on file    Active member of club or organization: Not on file    Attends meetings of clubs or organizations: Not on file    Relationship status: Not on file  . Intimate partner violence    Fear of current or ex partner: Not on file    Emotionally abused: Not on file    Physically abused: Not on file    Forced sexual activity: Not on file  Other Topics Concern  . Not on file  Social History Narrative  . Not on file      Family History:   The patient's family history includes Cancer in her father and mother; Hyperlipidemia in her mother. There is no history of Colon cancer, Colon polyps, Esophageal cancer, Rectal cancer, or Stomach cancer.     Review of Systems    Unable to be obtained. Patient currently intubated and sedated.   Physical Exam    Vitals:   05/17/19 1609 05/17/19 1634  BP: (!) 173/75   Pulse: 77   Resp: 16   Temp: 97.7 F (36.5 C)   TempSrc: Oral   SpO2: 100%   Weight:  65.6 kg  Height:  5\' 1"  (1.549 m)   No intake or output data in the 24 hours ending 05/17/19 1656 Filed Weights   05/17/19 1634  Weight: 65.6 kg   Body mass index is 27.33 kg/m.   General: Thin, African American female currently intubated.  Head: Normocephalic, atraumatic, sclera non-icteric, no xanthomas, nares are without discharge. Neck: No carotid bruits. JVD not elevated.  Lungs: Respirations regular and unlabored. Currently intubated.  Heart: Irregularly irregular. No S3 or S4.  No murmur, no rubs, or gallops appreciated. Abdomen: Soft, non-tender, non-distended with normoactive bowel sounds. No hepatomegaly. No rebound/guarding. No obvious abdominal masses. Msk:  Strength and tone appear normal for age. No joint  deformities or effusions. Extremities: No clubbing or cyanosis. No edema.  Distal pedal pulses are 2+ bilaterally. Neuro:  No focal deficits noted. Psych:  Currently intubated. Unable to be assessed.  Skin: No rashes or lesions noted  Labs and Radiology Studies    EKG:  The ECG that was done was personally reviewed and demonstrates NSR, HR 77, with ST elevation along the inferior and anterior leads with reciprocal ST depression along the lateral leads.   Relevant CV Studies:  None  on File.   Laboratory Data:  Chemistry Recent Labs  Lab 05/17/19 1640  NA 137  K 4.1  CL 102  GLUCOSE 105*  BUN 14  CREATININE 0.70    No results for input(s): PROT, ALBUMIN, AST, ALT, ALKPHOS, BILITOT in the last 168 hours. Hematology Recent Labs  Lab 05/17/19 1616 05/17/19 1640  WBC 8.3  --   RBC 5.16*  --   HGB 12.9 14.6  HCT 41.2 43.0  MCV 79.8*  --   MCH 25.0*  --   MCHC 31.3  --   RDW 15.3  --   PLT 324  --    Cardiac EnzymesNo results for input(s): TROPONINI in the last 168 hours. No results for input(s): TROPIPOC in the last 168 hours.  BNPNo results for input(s): BNP, PROBNP in the last 168 hours.  DDimer No results for input(s): DDIMER in the last 168 hours.  Radiology/Studies:  No results found.  Assessment and Plan:   1. STEMI - presented with chest pain starting an hour prior to arrival. Prior to the patient coding, she was able to report having used Cocaine yesterday.  - initial EKG showed NSR, HR 77, with ST elevation along the inferior and anterior leads with reciprocal ST depression along the lateral leads.  - going for emergent cardiac catheterization. Further recommendations pending cath results. Will check FLP and Hgb A1c along with follow-up HS Troponin values. Continue ASA and statin.   2. Cardiac Arrest - Ventricular Fibrillation arrest while in the ED requiring 1-2 minutes of CPR, Epi x1 and shock. Plan for emergent cardiac catheterization as outlined above.  Critical Care has been consulted for vent management.   3. HLD - listed as taking Atorvastatin 40mg  daily PTA. Continue and would recheck FLP.    Severity of Illness: The appropriate patient status for this patient is INPATIENT. Inpatient status is judged to be reasonable and necessary in order to provide the required intensity of service to ensure the patient's safety. The patient's presenting symptoms, physical exam findings, and initial radiographic and laboratory data in the context of their chronic comorbidities is felt to place them at high risk for further clinical deterioration. Furthermore, it is not anticipated that the patient will be medically stable for discharge from the hospital within 2 midnights of admission. The following factors support the patient status of inpatient.   " The patient's presenting symptoms include chest pain. " The worrisome physical exam findings include intubation. " The initial radiographic and laboratory data are worrisome because of ST elevation on EKG.   * I certify that at the point of admission it is my clinical judgment that the patient will require inpatient hospital care spanning beyond 2 midnights from the point of admission due to high intensity of service, high risk for further deterioration and high frequency of surveillance required.*    For questions or updates, please contact [place] Please consult [email or web address] for contact info under Cardiology/STEMI.   Signed, Erma Heritage, PA-C 05/17/2019, 4:56 PM Pager: [phone]  Patient seen and examined. Agree with assessment and plan.  Ms. Benesch is a soon-to-be 58 year old African-American female (birthday is tomorrow) who has history of hypertension, hyperlipidemia, and hypothyroidism.  Currently yesterday, the patient had used cocaine.  Developed significant chest pain today and presented to the emergency room and was found to have inferior ST elevation.  Upon transferring her  from a stretcher to bed she then went into ventricular fibrillation and coded.  CPR was administered.  She received a dose of epinephrine and ultimately had return of spontaneous circulation.  She was noted to have marked ST elevation inferiorly with atrial fibrillation with RVR.  She was intubated to the emergency room she now presents acutely to the catheterization laboratory for acute emergency cardiac catheterization and possible intervention.     Troy Sine, MD, [facility] 05/17/2019 6:36 PM

## 2019-05-17 NOTE — ED Notes (Signed)
Cardiology at bedside.

## 2019-05-17 NOTE — ED Provider Notes (Signed)
[facility] CATH LAB Provider Note   CSN: [id number] Arrival date & time: 05/17/19  1604     History   Chief Complaint Chief Complaint  Patient presents with   Chest Pain    HPI Brooke Rivera is a 58 y.o. female.     Patient with history of tobacco abuse, cocaine use last used yesterday had not used for some time, high blood pressure presented to the emergency room for significant chest discomfort radiation to left arm.  While patient was in triage EKG was reviewed concerning for STEMI and patient was brought back immediately.  During initial discussion of symptoms in the hallway patient went into cardiac arrest/unresponsive.     Past Medical History:  Diagnosis Date   Allergy    mild    Depression    rx not taking at this time   Goiter, lymphadenoid    thyroid   Hx of adenomatous polyp of colon 05/26/2017   Hyperlipidemia    Hypertension     Patient Active Problem List   Diagnosis Date Noted   STEMI (ST elevation myocardial infarction) ([place]) 05/17/2019   Abnormal mammogram 11/29/2017   Loss of weight 11/22/2017   Dry skin 11/22/2017   Hx of adenomatous polyp of colon 05/26/2017   Bilateral leg cramps 09/03/2016   Hypothyroidism 11/02/2014   Scotoma 01/01/2014   GERD (gastroesophageal reflux disease) 04/04/2011   Hypertension, benign 01/03/2011   Hyperlipidemia 06/06/2010   TOBACCO USER 06/06/2010   Depressive disorder 06/06/2010   LEARNING DISABILITY 06/06/2010   HOT FLASHES 06/06/2010    Past Surgical History:  Procedure Laterality Date   COLONOSCOPY  04/2017   1 adenoma   THYROIDECTOMY Bilateral 07/08/2015   Procedure: BILATERAL THYROIDECTOMY;  Surgeon: Ruby Cola, MD;  Location: [facility] OR;  Service: ENT;  Laterality: Bilateral;     OB History   No obstetric history on file.      Home Medications    Prior to Admission medications   Medication Sig Start Date End Date Taking? Authorizing  Provider  albuterol (PROVENTIL HFA;VENTOLIN HFA) 108 (90 Base) MCG/ACT inhaler Inhale 2 puffs into the lungs every 6 (six) hours as needed for wheezing or shortness of breath. 10/10/17   Smiley Houseman, MD  aspirin EC 81 MG tablet Take 1 tablet (81 mg total) by mouth daily. 03/20/17   Smiley Houseman, MD  atorvastatin (LIPITOR) 40 MG tablet Take 1 tablet (40 mg total) by mouth daily. 03/10/18   Bufford Lope, DO  azelastine (ASTELIN) 0.1 % nasal spray Place 1 spray into both nostrils 2 (two) times daily. Use in each nostril as directed 10/10/17   Smiley Houseman, MD  hydrochlorothiazide (MICROZIDE) 12.5 MG capsule Take 1 capsule (12.5 mg total) by mouth daily. 03/10/18   Bufford Lope, DO  levothyroxine (SYNTHROID) 175 MCG tablet Take 1 tablet (175 mcg total) by mouth daily before breakfast. 04/03/19   Winfrey, Alcario Drought, MD    Family History Family History  Problem Relation Age of Onset   Hyperlipidemia Mother    Cancer Mother    Cancer Father    Colon cancer Neg Hx    Colon polyps Neg Hx    Esophageal cancer Neg Hx    Rectal cancer Neg Hx    Stomach cancer Neg Hx     Social History Social History   Tobacco Use   Smoking status: Current Every Day Smoker    Packs/day: 0.25    Types: Cigarettes  Smokeless tobacco: Never Used   Tobacco comment: 4-5 cigs a day   Substance Use Topics   Alcohol use: Yes    Alcohol/week: 0.0 standard drinks    Comment: last occurence 3 weeks ago   Drug use: No     Allergies   Other   Review of Systems Review of Systems  Unable to perform ROS: Acuity of condition     Physical Exam Updated Vital Signs BP (!) 173/75 (BP Location: Left Arm)    Pulse 77    Temp 97.7 F (36.5 C) (Oral)    Resp 16    Ht _0  (1.549 m)    Wt 65.6 kg    SpO2 99%    BMI 27.33 kg/m   Physical Exam Vitals signs and nursing note reviewed.  Constitutional:      Appearance: She is well-developed.  HENT:     Head: Normocephalic and  atraumatic.  Eyes:     General:        Right eye: No discharge.        Left eye: No discharge.     Conjunctiva/sclera: Conjunctivae normal.  Neck:     Trachea: No tracheal deviation.  Cardiovascular:     Rate and Rhythm: Normal rate and regular rhythm.  Pulmonary:     Effort: Pulmonary effort is normal.  Abdominal:     General: There is no distension.     Palpations: Abdomen is soft.  Skin:    General: Skin is warm.  Neurological:     Mental Status: She is alert.     Comments: Initially patient was alert oriented conversant, no gross focal deficits neurologically.  Patient was unresponsive after cardiac arrest, myoclonic jerking followed by not responsive.  Patient then after resuscitation was moving all extremities with normal strength with agitation.      ED Treatments / Results  Labs (all labs ordered are listed, but only abnormal results are displayed) Labs Reviewed  CBC - Abnormal; Notable for the following components:      Result Value   RBC 5.16 (*)    MCV 79.8 (*)    MCH 25.0 (*)    All other components within normal limits  I-STAT BETA HCG BLOOD, ED ([facility], [facility], [facility] ONLY) - Abnormal; Notable for the following components:   I-stat hCG, quantitative 5.1 (*)    All other components within normal limits  I-STAT CHEM 8, ED - Abnormal; Notable for the following components:   Glucose, Bld 105 (*)    Calcium, Ion 1.02 (*)    All other components within normal limits  SARS CORONAVIRUS 2 (HOSPITAL ORDER, [facility])  BASIC METABOLIC PANEL  TRIGLYCERIDES  BLOOD GAS, ARTERIAL  COMPREHENSIVE METABOLIC PANEL  CBC  RAPID URINE DRUG SCREEN, HOSP PERFORMED  TSH  T4, FREE  TROPONIN I (HIGH SENSITIVITY)  TROPONIN I (HIGH SENSITIVITY)    EKG Date/Time:  Sunday May 17 2019 16:09:28 EDT Ventricular Rate:  77 PR Interval:  200 QRS Duration: 70 QT Interval:  388 QTC Calculation: 439 R Axis:   61 Text Interpretation:   Normal sinus rhythm ST  elevation consider anterolateral injury or acute infarct ST elevation consider inferior injury or acute infarct ACUTE MI Abnormal ECG Confirmed by Elnora Morrison [phone]) on 05/17/2019 4:16:26 PM   Repeat EKG after cardiac arrest revealed atrial fibrillation with significant ST elevation inferiorly and anterior with depression V1 V2.  Normal QT.  Acute MI.  Radiology Dg Chest Portable 1  View  Result Date: 05/17/2019 CLINICAL DATA:  Intubation EXAM: PORTABLE CHEST 1 VIEW COMPARISON:  07/08/2015 FINDINGS: Endotracheal tube is positioned with tip in the right mainstem bronchus. Esophagogastric tube with tip and side port below the diaphragm. Cardiomegaly. Heterogeneous airspace opacity of the left midlung. IMPRESSION: 1. Endotracheal tube is positioned with tip in the right mainstem bronchus. Tube tip is retracted to appropriate position on subsequent radiographs time stamped 4:52 p.m. 2.  Cardiomegaly. 3. Heterogeneous airspace opacity of the left midlung, concerning for infection or aspiration. Electronically Signed   By: Eddie Candle M.D.   On: 05/17/2019 17:05   Dg Chest Portable 1 View  Result Date: 05/17/2019 CLINICAL DATA:  Intubation EXAM: PORTABLE CHEST 1 VIEW COMPARISON:  None. FINDINGS: The endotracheal tube has been retracted and is now 2 cm above the level of the carina. NG tube is seen below the diaphragm. Cardiomediastinal silhouette is unchanged. The lungs are clear. IMPRESSION: Endotracheal tube tip 2 cm above the carina. NG tube below the level of the diaphragm. Electronically Signed   By: Prudencio Pair M.D.   On: 05/17/2019 17:05   Dg Chest Portable 1 View  Result Date: 05/17/2019 CLINICAL DATA:  Intubation EXAM: PORTABLE CHEST 1 VIEW COMPARISON:  Radiograph same day FINDINGS: ET tube has been retracted now with at the level of the carina, at the junction of the right mainstem bronchus. NG tube is seen below the level of the diaphragm. The lungs are clear. Cardiomediastinal silhouette is  unchanged. IMPRESSION: ET tube has been retracted now seen at the level of the carina just at the junction of the right mainstem bronchus. NG tube below the level of the diaphragm Electronically Signed   By: Prudencio Pair M.D.   On: 05/17/2019 17:04    Procedures .Critical Care Performed by: Elnora Morrison, MD Authorized by: Elnora Morrison, MD   Critical care provider statement:    Critical care time (minutes):  35   Critical care start time:  05/17/2019 4:13 PM   Critical care end time:  05/17/2019 4:38 PM   Critical care time was exclusive of:  Separately billable procedures and treating other patients and teaching time   Critical care was necessary to treat or prevent imminent or life-threatening deterioration of the following conditions:  Cardiac failure   Critical care was time spent personally by me on the following activities:  Discussions with consultants, evaluation of patient's response to treatment, examination of patient, ordering and performing treatments and interventions, ordering and review of laboratory studies, ordering and review of radiographic studies, pulse oximetry, re-evaluation of patient's condition, obtaining history from patient or surrogate and review of old charts Procedure Name: Intubation Date/Time: 05/17/2019 6:01 PM Performed by: Elnora Morrison, MD Pre-anesthesia Checklist: Emergency Drugs available Oxygen Delivery Method: Ambu bag Preoxygenation: Pre-oxygenation with 100% oxygen Induction Type: Rapid sequence Ventilation: Mask ventilation without difficulty Laryngoscope Size: Glidescope and 3 Grade View: Grade I Tube size: 7.5 mm Number of attempts: 1 Placement Confirmation: ETT inserted through vocal cords under direct vision,  Positive ETCO2,  CO2 detector and Breath sounds checked- equal and bilateral Tube secured with: ETT holder     Cardiopulmonary Resuscitation (CPR) Procedure Note Directed/Performed by: Mariea Clonts I personally directed  ancillary staff and/or performed CPR in an effort to regain return of spontaneous circulation and to maintain cardiac, neuro and systemic perfusion.    (including critical care time)  Medications Ordered in ED Medications  sodium chloride flush (NS) 0.9 % injection 3 mL (  Intravenous MAR Hold 05/17/19 1707)  fentaNYL 254mg in NS 2511m(1062mml) infusion-PREMIX (has no administration in time range)  fentaNYL (SUBLIMAZE) bolus via infusion 50 mcg ( Intravenous MAR Hold 05/17/19 1707)  propofol (DIPRIVAN) 1000 MG/100ML infusion (has no administration in time range)  midazolam (VERSED) injection 2 mg ( Intravenous MAR Hold 05/17/19 1707)  chlorhexidine gluconate (MEDLINE KIT) (PERIDEX) 0.12 % solution 15 mL ( Mouth Rinse Automatically Held 05/25/19 2000)  MEDLINE mouth rinse ( Mouth Rinse Automatically Held 05/25/19 2200)  pantoprazole (PROTONIX) injection 40 mg ( Intravenous Automatically Held 05/25/19 1000)  albuterol (PROVENTIL) (2.5 MG/3ML) 0.083% nebulizer solution 2.5 mg ( Nebulization MAR Hold 05/17/19 1708)  atorvastatin (LIPITOR) tablet 40 mg ( Per Tube Automatically Held 05/25/19 1000)  acetaminophen (TYLENOL) tablet 650 mg ( Per Tube MAR Hold 05/17/19 1708)  levothyroxine (SYNTHROID) tablet 50 mcg ( Per Tube Automatically Held 05/26/19 0800)  0.9 %  sodium chloride infusion (has no administration in time range)  lidocaine (PF) (XYLOCAINE) 1 % injection (18 mLs Subcutaneous Given 05/17/19 1719)  amiodarone (NEXTERONE) 1.8 mg/mL load via infusion 150 mg (has no administration in time range)    Followed by  amiodarone (NEXTERONE PREMIX) 360-4.14 MG/200ML-% (1.8 mg/mL) IV infusion (60 mg/hr Intravenous New Bag/Given 05/17/19 1747)    Followed by  amiodarone (NEXTERONE PREMIX) 360-4.14 MG/200ML-% (1.8 mg/mL) IV infusion (has no administration in time range)  bivalirudin (ANGIOMAX) BOLUS via infusion (49.2 mg Intravenous Given 05/17/19 1730)  bivalirudin (ANGIOMAX) 250 mg in sodium chloride 0.9 % 50  mL (5 mg/mL) infusion (1.75 mg/kg/hr  65.6 kg Intravenous New Bag/Given 05/17/19 1732)  amiodarone (NEXTERONE) 1.8 mg/mL load via infusion (150 mg Intravenous Given 05/17/19 1742)  nitroGLYCERIN 1 mg/10 mL (100 mcg/mL) - IR/CATH LAB (100 mcg Intracoronary Given 05/17/19 1750)  cangrelor (KENGREAL) bolus via infusion (1,968 mcg Intravenous Given 05/17/19 1747)  cangrelor (KENGREAL) 50,000 mcg in sodium chloride 0.9 % 250 mL (200 mcg/mL) infusion (4 mcg/kg/min  65.6 kg Intravenous New Bag/Given 05/17/19 1750)  EPINEPHrine (ADRENALIN) 1 MG/10ML injection (1 Syringe Intravenous Given 05/17/19 1622)  etomidate (AMIDATE) injection (15 mg Intravenous Given 05/17/19 1630)  rocuronium (ZEMURON) injection (80 mg Intravenous Given 05/17/19 1630)  heparin injection 4,000 Units (4,000 Units Intravenous Given 05/17/19 1647)  verapamil (ISOPTIN) 2.5 MG/ML injection (has no administration in time range)  lidocaine (PF) (XYLOCAINE) 1 % injection (has no administration in time range)  Heparin (Porcine) in NaCl 1000-0.9 UT/500ML-% SOLN (has no administration in time range)  nitroGLYCERIN 100 mcg/mL intra-arterial injection (has no administration in time range)  heparin 1000 UNIT/ML injection (has no administration in time range)  lidocaine (PF) (XYLOCAINE) 1 % injection (has no administration in time range)  Heparin (Porcine) in NaCl 1000-0.9 UT/500ML-% SOLN (has no administration in time range)  bivalirudin (ANGIOMAX) 250 MG injection (has no administration in time range)  amiodarone (NEXTERONE PREMIX) 360-4.14 MG/200ML-% (1.8 mg/mL) IV infusion (has no administration in time range)  amiodarone (NEXTERONE PREMIX) 360-4.14 MG/200ML-% (1.8 mg/mL) IV infusion (has no administration in time range)  cangrelor (KENGREAL) 50 MG SOLR (has no administration in time range)  norepinephrine (LEVOPHED) 4-5 MG/250ML-% infusion SOLN (has no administration in time range)     Initial Impression / Assessment and Plan / ED Course  I  have reviewed the triage vital signs and the nursing notes.  Pertinent labs & imaging results that were available during my care of the patient were reviewed by me and considered in my medical decision making (see chart for details).  Patient presented for significant chest discomfort and initial EKG in triage concerning for STEMI.  Patient was walked by myself and nursing staff to her room awaiting for to be cleaned and she had a cardiac arrest while talking to [place].  We immediately rushed her to the trauma bay, IV placed, CPR 1 round followed by 1 dose of epi.  Patient was in V. fib was shocked and shortly afterwards started to move/was agitated/confused.  Code STEMI was called prior to this.  Discussed the case with cardiology and plan for Cath Lab.  Patient was intubated without difficulty.  Heparin given, blood work sent. ICU paged. Discussed and shared EKG/reviewed with cardiology at bedside.  The patients results and plan were reviewed and discussed.   Any x-rays performed were independently reviewed by myself.   Differential diagnosis were considered with the presenting HPI.  Medications  sodium chloride flush (NS) 0.9 % injection 3 mL ( Intravenous MAR Hold 05/17/19 1707)  fentaNYL 256mg in NS 2554m(1075mml) infusion-PREMIX (has no administration in time range)  fentaNYL (SUBLIMAZE) bolus via infusion 50 mcg ( Intravenous MAR Hold 05/17/19 1707)  propofol (DIPRIVAN) 1000 MG/100ML infusion (has no administration in time range)  midazolam (VERSED) injection 2 mg ( Intravenous MAR Hold 05/17/19 1707)  chlorhexidine gluconate (MEDLINE KIT) (PERIDEX) 0.12 % solution 15 mL ( Mouth Rinse Automatically Held 05/25/19 2000)  MEDLINE mouth rinse ( Mouth Rinse Automatically Held 05/25/19 2200)  pantoprazole (PROTONIX) injection 40 mg ( Intravenous Automatically Held 05/25/19 1000)  albuterol (PROVENTIL) (2.5 MG/3ML) 0.083% nebulizer solution 2.5 mg ( Nebulization MAR Hold 05/17/19 1708)    atorvastatin (LIPITOR) tablet 40 mg ( Per Tube Automatically Held 05/25/19 1000)  acetaminophen (TYLENOL) tablet 650 mg ( Per Tube MAR Hold 05/17/19 1708)  levothyroxine (SYNTHROID) tablet 50 mcg ( Per Tube Automatically Held 05/26/19 0800)  0.9 %  sodium chloride infusion (has no administration in time range)  lidocaine (PF) (XYLOCAINE) 1 % injection (18 mLs Subcutaneous Given 05/17/19 1719)  amiodarone (NEXTERONE) 1.8 mg/mL load via infusion 150 mg (has no administration in time range)    Followed by  amiodarone (NEXTERONE PREMIX) 360-4.14 MG/200ML-% (1.8 mg/mL) IV infusion (60 mg/hr Intravenous New Bag/Given 05/17/19 1747)    Followed by  amiodarone (NEXTERONE PREMIX) 360-4.14 MG/200ML-% (1.8 mg/mL) IV infusion (has no administration in time range)  bivalirudin (ANGIOMAX) BOLUS via infusion (49.2 mg Intravenous Given 05/17/19 1730)  bivalirudin (ANGIOMAX) 250 mg in sodium chloride 0.9 % 50 mL (5 mg/mL) infusion (1.75 mg/kg/hr  65.6 kg Intravenous New Bag/Given 05/17/19 1732)  amiodarone (NEXTERONE) 1.8 mg/mL load via infusion (150 mg Intravenous Given 05/17/19 1742)  nitroGLYCERIN 1 mg/10 mL (100 mcg/mL) - IR/CATH LAB (100 mcg Intracoronary Given 05/17/19 1750)  cangrelor (KENGREAL) bolus via infusion (1,968 mcg Intravenous Given 05/17/19 1747)  cangrelor (KENGREAL) 50,000 mcg in sodium chloride 0.9 % 250 mL (200 mcg/mL) infusion (4 mcg/kg/min  65.6 kg Intravenous New Bag/Given 05/17/19 1750)  norepinephrine (LEVOPHED) 4 mg in dextrose 5 % 250 mL (0.016 mg/mL) infusion (5 mcg/min Intravenous New Bag/Given 05/17/19 1757)  0.9 %  sodium chloride infusion (1,000 mLs Intravenous New Bag/Given 05/17/19 1735)  EPINEPHrine (ADRENALIN) 1 MG/10ML injection (1 Syringe Intravenous Given 05/17/19 1622)  etomidate (AMIDATE) injection (15 mg Intravenous Given 05/17/19 1630)  rocuronium (ZEMURON) injection (80 mg Intravenous Given 05/17/19 1630)  heparin injection 4,000 Units (4,000 Units Intravenous Given 05/17/19  1647)  verapamil (ISOPTIN) 2.5 MG/ML injection (has no administration in time range)  lidocaine (PF) (XYLOCAINE) 1 %  injection (has no administration in time range)  Heparin (Porcine) in NaCl 1000-0.9 UT/500ML-% SOLN (has no administration in time range)  nitroGLYCERIN 100 mcg/mL intra-arterial injection (has no administration in time range)  heparin 1000 UNIT/ML injection (has no administration in time range)  lidocaine (PF) (XYLOCAINE) 1 % injection (has no administration in time range)  Heparin (Porcine) in NaCl 1000-0.9 UT/500ML-% SOLN (has no administration in time range)  bivalirudin (ANGIOMAX) 250 MG injection (has no administration in time range)  amiodarone (NEXTERONE PREMIX) 360-4.14 MG/200ML-% (1.8 mg/mL) IV infusion (has no administration in time range)  amiodarone (NEXTERONE PREMIX) 360-4.14 MG/200ML-% (1.8 mg/mL) IV infusion (has no administration in time range)  cangrelor (KENGREAL) 50 MG SOLR (has no administration in time range)  norepinephrine (LEVOPHED) 4-5 MG/250ML-% infusion SOLN (has no administration in time range)    Vitals:   05/17/19 1609 05/17/19 1634 05/17/19 1716  BP: (!) 173/75    Pulse: 77    Resp: 16    Temp: 97.7 F (36.5 C)    TempSrc: Oral    SpO2: 100%  99%  Weight:  65.6 kg   Height:  _0  (1.549 m)     Final diagnoses:  Ventricular fibrillation ([facility])  Acute myocardial infarction, unspecified MI type, unspecified artery ([place])  Cocaine abuse ([place])    Admission/ observation were discussed with the admitting physician they are comfortable with the plan.    Final Clinical Impressions(s) / ED Diagnoses   Final diagnoses:  Ventricular fibrillation ([place])  Acute myocardial infarction, unspecified MI type, unspecified artery ([place])  Cocaine abuse [facility])    ED Discharge Orders    None       Elnora Morrison, MD 05/17/19 [phone]

## 2019-05-17 NOTE — ED Notes (Signed)
Pt was speaking with provider while in w/c, pt became unresponsive and pulseless. Pt taken to Trauma B, CPR initiated, 1 shock and epi administered.

## 2019-05-17 NOTE — Consult Note (Signed)
NAME:  Brooke Rivera, MRN:  [id number], DOB:  May 23, 1961, LOS: 0 ADMISSION DATE:  05/17/2019, CONSULTATION DATE:  05/17/19 REFERRING MD:  Estanislado Spire Lovena Le, CHIEF COMPLAINT:  Cardiac arrest and respiratory failure   Brief History   58 year old female with PMH below who presents to PCCM with respiratory failure after cardiac arrest.  Patient presented to the hospital with CP and was noted to have a STEMI.  Patient was being transferred to her room prior to a cath lab visit when she suffered a cardiac arrest.  CPR x5 minutes and patient was intubated.  Post code she was alert and interactive.  But given the cath lab visit pending patient was left intubated and PCCM was called on consultation for critical care and vent management.  Patient is currently sedated and intubated so further history is available.  History of present illness   58 year old female with PMH below who presents to PCCM with respiratory failure after cardiac arrest.  Patient presented to the hospital with CP and was noted to have a STEMI.  Patient was being transferred to her room prior to a cath lab visit when she suffered a cardiac arrest.  CPR x5 minutes and patient was intubated.  Post code she was alert and interactive.  But given the cath lab visit pending patient was left intubated and PCCM was called on consultation for critical care and vent management.  Patient is currently sedated and intubated so further history is available.  Past Medical History  HTN HLD  Significant Hospital Events   05/17/19 cardiac arrest, intubation and cath lab visit  Consults:  PCCM Cardiology  Procedures:  Cath 9/20>>> ETT 9/20>>>  Significant Diagnostic Tests:  Cath 9/20>>>  Micro Data:  N/A  Antimicrobials:  N/A   Interim history/subjective:  CPR and intubated, no further history available  Objective   Blood pressure (!) 173/75, pulse 77, temperature 97.7 F (36.5 C), temperature source Oral, resp. rate 16, height 5\' 1"   (1.549 m), weight 65.6 kg, SpO2 100 %.       No intake or output data in the 24 hours ending 05/17/19 1657 Filed Weights   05/17/19 1634  Weight: 65.6 kg    Examination: General: Acutely ill appearing female, NAD, sedated and paralyzed post intubation, unable to form a full exam HENT: [place]/AT, PERRL, no EOM, ETT in place and MMM Lungs: CTA bilaterally Cardiovascular: RRR, Nl S1/S2 and -M/R/G Abdomen: Soft, NT, ND and +BS Extremities: -edema and -tenderness Neuro: Sedated and paralyzed, unable to examine Skin: Intact  I reviewed CXR myself, ETT ok and pulmonary edema noted  Resolved Hospital Problem list   N/A  Assessment & Plan:  58 year old female with PMH of HTN and hyperlipidemia presenting with CP, diagnosed with STEMI then suffered a cardiac arrest and PCCM called for vent and medical management.  Cardiac arrest and STEMI:  - To cath lab  - Tele monitoring  - Recommendations per cards  VDRF:  - ETT  - Full vent support  - ABG once out of the cath lab  - Adjust vent for ABG  - CXR and ABG in AM  Pulmonary edema:  - Hold lasix  - Gentle hydration for contrast with cath  Hypothyroidism:  - Synthroid 50 PO daily, unsure how much is taking  - Check TSH and free T4  HTN:  - Hold anti-HTN for now  - Defer to cards  PCCM will continue to follow  Labs   CBC:  Recent Labs  Lab 05/17/19 1616 05/17/19 1640  WBC 8.3  --   HGB 12.9 14.6  HCT 41.2 43.0  MCV 79.8*  --   PLT 324  --     Basic Metabolic Panel: Recent Labs  Lab 05/17/19 1640  NA 137  K 4.1  CL 102  GLUCOSE 105*  BUN 14  CREATININE 0.70   GFR: Estimated Creatinine Clearance: 65.6 mL/min (by C-G formula based on SCr of 0.7 mg/dL). Recent Labs  Lab 05/17/19 1616  WBC 8.3    Liver Function Tests: No results for input(s): AST, ALT, ALKPHOS, BILITOT, PROT, ALBUMIN in the last 168 hours. No results for input(s): LIPASE, AMYLASE in the last 168 hours. No results for input(s): AMMONIA in  the last 168 hours.  ABG    Component Value Date/Time   TCO2 24 05/17/2019 1640     Coagulation Profile: No results for input(s): INR, PROTIME in the last 168 hours.  Cardiac Enzymes: No results for input(s): CKTOTAL, CKMB, CKMBINDEX, TROPONINI in the last 168 hours.  HbA1C: No results found for: HGBA1C  CBG: No results for input(s): GLUCAP in the last 168 hours.  Review of Systems:   Unattainable, patient is sedated and paralyzed  Past Medical History  She,  has a past medical history of Allergy, Depression, Goiter, lymphadenoid, adenomatous polyp of colon (05/26/2017), Hyperlipidemia, and Hypertension.   Surgical History    Past Surgical History:  Procedure Laterality Date  . COLONOSCOPY  04/2017   1 adenoma  . THYROIDECTOMY Bilateral 07/08/2015   Procedure: BILATERAL THYROIDECTOMY;  Surgeon: Ruby Cola, MD;  Location: [facility] OR;  Service: ENT;  Laterality: Bilateral;   Social History   reports that she has been smoking cigarettes. She has been smoking about 0.25 packs per day. She has never used smokeless tobacco. She reports current alcohol use. She reports that she does not use drugs.   Family History   Her family history includes Cancer in her father and mother; Hyperlipidemia in her mother. There is no history of Colon cancer, Colon polyps, Esophageal cancer, Rectal cancer, or Stomach cancer.   Allergies Allergies  Allergen Reactions  . Other Other (See Comments)    Redness face ,itching with cabbage     Home Medications  Prior to Admission medications   Medication Sig Start Date End Date Taking? Authorizing Provider  albuterol (PROVENTIL HFA;VENTOLIN HFA) 108 (90 Base) MCG/ACT inhaler Inhale 2 puffs into the lungs every 6 (six) hours as needed for wheezing or shortness of breath. 10/10/17   Smiley Houseman, MD  aspirin EC 81 MG tablet Take 1 tablet (81 mg total) by mouth daily. 03/20/17   Smiley Houseman, MD  atorvastatin (LIPITOR) 40 MG tablet Take  1 tablet (40 mg total) by mouth daily. 03/10/18   Bufford Lope, DO  azelastine (ASTELIN) 0.1 % nasal spray Place 1 spray into both nostrils 2 (two) times daily. Use in each nostril as directed 10/10/17   Smiley Houseman, MD  hydrochlorothiazide (MICROZIDE) 12.5 MG capsule Take 1 capsule (12.5 mg total) by mouth daily. 03/10/18   Bufford Lope, DO  levothyroxine (SYNTHROID) 175 MCG tablet Take 1 tablet (175 mcg total) by mouth daily before breakfast. 04/03/19   Kathrene Alu, MD    The patient is critically ill with multiple organ systems failure and requires high complexity decision making for assessment and support, frequent evaluation and titration of therapies, application of advanced monitoring technologies and extensive interpretation of multiple databases.  Critical Care Time devoted to patient care services described in this note is  38  Minutes. This time reflects time of care of this signee Dr Jennet Maduro. This critical care time does not reflect procedure time, or teaching time or supervisory time of PA/NP/Med student/Med Resident etc but could involve care discussion time.  Rush Farmer, M.D. [facility] Pulmonary/Critical Care Medicine. Pager: [phone]. After hours pager: [phone].

## 2019-05-17 NOTE — ED Triage Notes (Signed)
Pt reports onset of central chest pain while at rest about 1 hour ago associated with diaphoresis, and 1 episode of vomiting.

## 2019-05-17 NOTE — Progress Notes (Addendum)
Daughter Brooke Rivera): [phone]  Chaplain responded to STEMI; pt coded in ED en route to regular room. Chaplain telephoned daughter per RN to inform daughter that she would be able to return to hospital because pt can have one visitor.  Chaplain informed dau that pt was going to the Cath Lab, what the cath procedure is in general and that it takes about an hour (no other information reported).  Dau lives in [place] and will return to the hospital in approximately an hour.  Chaplain advised that cardiologist will call her if she is not in waiting area.     Returned after daughter arrived to hospital to offer support, prayer and tissues and to orient daughter to visitor guidelines (there is one son, Brooke Rivera, who hopes to visit tomorrow). Facilitated communication with Dr. Claiborne Billings and RN. Advised dau to wait for RN to bring her back for visit.    Please contact if further support is needed.    Luana Shu [phone]     05/17/19 1611  Clinical Encounter Type  Visited With Patient not available;Family (Daughter via phone)  Visit Type Other (Comment) (STEMI)  Referral From Physician  Consult/Referral To Chaplain  Stress Factors  Patient Stress Factors Health changes  Family Stress Factors Lack of knowledge  Advance Directives (For Healthcare)  Does Patient Have a Medical Advance Directive? No  Would patient like information on creating a medical advance directive? No - Guardian declined  Mental Health Advance Directives  Does Patient Have a Mental Health Advance Directive? No  Would patient like information on creating a mental health advance directive? No - Patient declined

## 2019-05-18 ENCOUNTER — Inpatient Hospital Stay (HOSPITAL_COMMUNITY): Payer: Medicare Other

## 2019-05-18 ENCOUNTER — Encounter (HOSPITAL_COMMUNITY): Payer: Self-pay | Admitting: Cardiovascular Disease

## 2019-05-18 DIAGNOSIS — I2111 ST elevation (STEMI) myocardial infarction involving right coronary artery: Principal | ICD-10-CM

## 2019-05-18 DIAGNOSIS — I4901 Ventricular fibrillation: Secondary | ICD-10-CM

## 2019-05-18 DIAGNOSIS — J9600 Acute respiratory failure, unspecified whether with hypoxia or hypercapnia: Secondary | ICD-10-CM

## 2019-05-18 DIAGNOSIS — I361 Nonrheumatic tricuspid (valve) insufficiency: Secondary | ICD-10-CM

## 2019-05-18 DIAGNOSIS — I469 Cardiac arrest, cause unspecified: Secondary | ICD-10-CM

## 2019-05-18 LAB — LIPID PANEL
Cholesterol: 153 mg/dL (ref 0–200)
HDL: 49 mg/dL (ref >40)
LDL Cholesterol: 90 mg/dL (ref 0–99)
Total CHOL/HDL Ratio: 3.1 RATIO
Triglycerides: 72 mg/dL (ref <150)
VLDL: 14 mg/dL (ref 0–40)

## 2019-05-18 LAB — COMPREHENSIVE METABOLIC PANEL WITH GFR
ALT: 81 U/L — ABNORMAL HIGH (ref 0–44)
AST: 179 U/L — ABNORMAL HIGH (ref 15–41)
Albumin: 3.3 g/dL — ABNORMAL LOW (ref 3.5–5.0)
Alkaline Phosphatase: 44 U/L (ref 38–126)
Anion gap: 13 (ref 5–15)
BUN: 9 mg/dL (ref 6–20)
CO2: 20 mmol/L — ABNORMAL LOW (ref 22–32)
Calcium: 8.4 mg/dL — ABNORMAL LOW (ref 8.9–10.3)
Chloride: 104 mmol/L (ref 98–111)
Creatinine, Ser: 0.77 mg/dL (ref 0.44–1.00)
GFR calc Af Amer: >60 mL/min
GFR calc non Af Amer: >60 mL/min
Glucose, Bld: 126 mg/dL — ABNORMAL HIGH (ref 70–99)
Potassium: 3.3 mmol/L — ABNORMAL LOW (ref 3.5–5.1)
Sodium: 137 mmol/L (ref 135–145)
Total Bilirubin: 0.7 mg/dL (ref 0.3–1.2)
Total Protein: 6.1 g/dL — ABNORMAL LOW (ref 6.5–8.1)

## 2019-05-18 LAB — ECHOCARDIOGRAM COMPLETE
Height: 61 in
Weight: 2109.36 oz

## 2019-05-18 LAB — POCT ACTIVATED CLOTTING TIME
Activated Clotting Time: 384 seconds
Activated Clotting Time: 940 seconds

## 2019-05-18 LAB — CBC
HCT: 35 % — ABNORMAL LOW (ref 36.0–46.0)
Hemoglobin: 11 g/dL — ABNORMAL LOW (ref 12.0–15.0)
MCH: 24.5 pg — ABNORMAL LOW (ref 26.0–34.0)
MCHC: 31.4 g/dL (ref 30.0–36.0)
MCV: 78 fL — ABNORMAL LOW (ref 80.0–100.0)
Platelets: 357 10*3/uL (ref 150–400)
RBC: 4.49 MIL/uL (ref 3.87–5.11)
RDW: 15.3 % (ref 11.5–15.5)
WBC: 12.4 10*3/uL — ABNORMAL HIGH (ref 4.0–10.5)
nRBC: 0 % (ref 0.0–0.2)

## 2019-05-18 LAB — TRIGLYCERIDES: Triglycerides: 70 mg/dL (ref <150)

## 2019-05-18 LAB — HEMOGLOBIN A1C
Hgb A1c MFr Bld: 5.7 % — ABNORMAL HIGH (ref 4.8–5.6)
Mean Plasma Glucose: 116.89 mg/dL

## 2019-05-18 LAB — TROPONIN I (HIGH SENSITIVITY): Troponin I (High Sensitivity): >27000 ng/L (ref <18)

## 2019-05-18 LAB — HIV ANTIBODY (ROUTINE TESTING W REFLEX): HIV Screen 4th Generation wRfx: NONREACTIVE

## 2019-05-18 LAB — T4, FREE: Free T4: 1.3 ng/dL — ABNORMAL HIGH (ref 0.61–1.12)

## 2019-05-18 LAB — TSH: TSH: 5.527 u[IU]/mL — ABNORMAL HIGH (ref 0.350–4.500)

## 2019-05-18 MED ORDER — SODIUM CHLORIDE 0.9 % WEIGHT BASED INFUSION: 1.0000 mL/kg/h | INTRAVENOUS | Status: DC

## 2019-05-18 MED ORDER — AMIODARONE HCL IN DEXTROSE 360-4.14 MG/200ML-% IV SOLN
INTRAVENOUS | Status: AC
  Filled 2019-05-18: qty 200

## 2019-05-18 MED ORDER — SODIUM CHLORIDE 0.9 % WEIGHT BASED INFUSION
3.0000 mL/kg/h | INTRAVENOUS | Status: DC
  Administered 2019-05-19: 3 mL/kg/h via INTRAVENOUS

## 2019-05-18 MED ORDER — PERFLUTREN LIPID MICROSPHERE
1.0000 mL | INTRAVENOUS | Status: AC | PRN
  Administered 2019-05-18: 1.5 mL via INTRAVENOUS
  Filled 2019-05-18: qty 10

## 2019-05-18 MED ORDER — CHLORHEXIDINE GLUCONATE 0.12 % MT SOLN: 15.0000 mL | Freq: Two times a day (BID) | OROMUCOSAL | Status: DC

## 2019-05-18 MED ORDER — ORAL CARE MOUTH RINSE
15.0000 mL | Freq: Two times a day (BID) | OROMUCOSAL | Status: DC
  Administered 2019-05-18 – 2019-05-20 (×4): 15 mL via OROMUCOSAL

## 2019-05-18 MED ORDER — POTASSIUM CHLORIDE 10 MEQ/100ML IV SOLN
10.0000 meq | INTRAVENOUS | Status: AC
  Administered 2019-05-18 (×4): 10 meq via INTRAVENOUS
  Filled 2019-05-18 (×4): qty 100

## 2019-05-18 MED ORDER — ORAL CARE MOUTH RINSE
15.0000 mL | Freq: Two times a day (BID) | OROMUCOSAL | Status: DC
  Administered 2019-05-18: 15 mL via OROMUCOSAL

## 2019-05-18 MED ORDER — METOPROLOL TARTRATE 25 MG PO TABS
25.0000 mg | ORAL_TABLET | Freq: Two times a day (BID) | ORAL | Status: DC
  Administered 2019-05-18 – 2019-05-20 (×5): 25 mg via ORAL
  Filled 2019-05-18 (×5): qty 1

## 2019-05-18 MED FILL — Heparin Sodium (Porcine) Inj 1000 Unit/ML: INTRAMUSCULAR | Qty: 10 | Status: AC

## 2019-05-18 MED FILL — Lidocaine HCl Local Preservative Free (PF) Inj 1%: INTRAMUSCULAR | Qty: 30 | Status: AC

## 2019-05-18 MED FILL — Verapamil HCl IV Soln 2.5 MG/ML: INTRAVENOUS | Qty: 2 | Status: AC

## 2019-05-18 MED FILL — Heparin Sod (Porcine)-NaCl IV Soln 1000 Unit/500ML-0.9%: INTRAVENOUS | Qty: 1000 | Status: AC

## 2019-05-18 MED FILL — Medication: Qty: 1 | Status: AC

## 2019-05-18 NOTE — Plan of Care (Signed)
  Problem: Activity: Goal: Ability to tolerate increased activity will improve Outcome: Progressing   Problem: Cardiac: Goal: Ability to achieve and maintain adequate cardiopulmonary perfusion will improve Outcome: Progressing Goal: Vascular access site(s) Level 0-1 will be maintained Outcome: Progressing   Problem: Activity: Goal: Ability to tolerate increased activity will improve Outcome: Progressing   Problem: Respiratory: Goal: Ability to maintain a clear airway and adequate ventilation will improve Outcome: Progressing   Problem: Role Relationship: Goal: Method of communication will improve Outcome: Progressing

## 2019-05-18 NOTE — Progress Notes (Signed)
R femoral sheath removed at 0032 using manual pressure for 22 minutes. Site level 0 at start and finish. Patient tolerated without complications. Patient educated about sheath pull and how to hold pressure at groin site, and to alert staff if bleeding noted.

## 2019-05-18 NOTE — H&P (View-Only) (Signed)
Progress Note  Patient Name: Brooke Rivera Date of Encounter: 05/18/2019  Primary Cardiologist: No primary care provider on file. Dr. Claiborne Billings  Subjective   No chest pain.  Some NSVT on tele noted.  On IV Amio.  Inpatient Medications    Scheduled Meds: . aspirin  81 mg Oral Daily  . atorvastatin  80 mg Oral q1800  . chlorhexidine gluconate (MEDLINE KIT)  15 mL Mouth Rinse BID  . Chlorhexidine Gluconate Cloth  6 each Topical Daily  . influenza vac split quadrivalent PF  0.5 mL Intramuscular Tomorrow-1000  . levothyroxine  50 mcg Per Tube QAC breakfast  . mouth rinse  15 mL Mouth Rinse 10 times per day  . metoprolol tartrate  25 mg Oral BID  . pantoprazole (PROTONIX) IV  40 mg Intravenous Daily  . pneumococcal 23 valent vaccine  0.5 mL Intramuscular Tomorrow-1000  . sodium chloride flush  3 mL Intravenous Q12H  . ticagrelor  90 mg Oral BID   Continuous Infusions: . sodium chloride    . sodium chloride 100 mL/hr at 05/18/19 0900  . amiodarone     PRN Meds: sodium chloride, acetaminophen, acetaminophen, albuterol, fentaNYL, midazolam, nitroGLYCERIN, ondansetron (ZOFRAN) IV, sodium chloride flush   Vital Signs    Vitals:   05/18/19 0700 05/18/19 0729 05/18/19 0800 05/18/19 0900  BP: 114/76 114/76 (!) 106/59 (!) 148/65  Pulse: 70 71 68 78  Resp: _0 (!) 31  Temp:      TempSrc:      SpO2: 99% 99% 98% 96%  Weight:      Height:        Intake/Output Summary (Last 24 hours) at 05/18/2019 1027 Last data filed at 05/18/2019 0900 Gross per 24 hour  Intake 2353.5 ml  Output 700 ml  Net 1653.5 ml   Last 3 Weights 05/18/2019 05/17/2019 04/02/2019  Weight (lbs) 131 lb 13.4 oz 144 lb 10 oz 144 lb 9.6 oz  Weight (kg) 59.8 kg 65.6 kg 65.59 kg      Telemetry    NSR, NSVT - Personally Reviewed  ECG    NSR, prolonged QT - Personally Reviewed  Physical Exam   GEN: No acute distress.  Intubated, but awake Neck: No JVD Cardiac: RRR, no murmurs, rubs, or gallops.   Respiratory: Clear to auscultation bilaterally. GI: Soft, nontender, non-distended  MS: No edema; No deformity. RIght groin without hematoma Neuro:  Nonfocal  Psych: Normal affect   Labs    High Sensitivity Troponin:   Recent Labs  Lab 05/17/19 1616 05/17/19 1921 05/17/19 2218 05/18/19 0112  TROPONINIHS 154* 9,016* >27,000* >27,000*      Chemistry Recent Labs  Lab 05/17/19 1616 05/17/19 1640 05/17/19 2026 05/18/19 0112  NA 137 137 138 137  K 3.6 4.1 3.5 3.3*  CL 102 102  --  104  CO2 22  --   --  20*  GLUCOSE 111* 105*  --  126*  BUN 12 14  --  9  CREATININE 0.85 0.70  --  0.77  CALCIUM 9.2  --   --  8.4*  PROT  --   --   --  6.1*  ALBUMIN  --   --   --  3.3*  AST  --   --   --  179*  ALT  --   --   --  81*  ALKPHOS  --   --   --  44  BILITOT  --   --   --  0.7  GFRNONAA >60  --   --  >60  GFRAA >60  --   --  >60  ANIONGAP 13  --   --  13     Hematology Recent Labs  Lab 05/17/19 1616 05/17/19 1640 05/17/19 2026 05/18/19 0112  WBC 8.3  --   --  12.4*  RBC 5.16*  --   --  4.49  HGB 12.9 14.6 12.2 11.0*  HCT 41.2 43.0 36.0 35.0*  MCV 79.8*  --   --  78.0*  MCH 25.0*  --   --  24.5*  MCHC 31.3  --   --  31.4  RDW 15.3  --   --  15.3  PLT 324  --   --  357    BNPNo results for input(s): BNP, PROBNP in the last 168 hours.   DDimer No results for input(s): DDIMER in the last 168 hours.   Radiology    Dg Chest Port 1 View  Result Date: 05/18/2019 CLINICAL DATA:  Intubation.  Respiratory failure. EXAM: PORTABLE CHEST 1 VIEW COMPARISON:  05/17/2019. FINDINGS: Endotracheal tube and NG tube in stable position. Cardiomegaly. Low lung volumes with bilateral perihilar and left base atelectasis/infiltrates. Small left pleural effusion. No pneumothorax. Thoracic spine scoliosis. IMPRESSION: 1.  Lines and tubes in stable position. 2.  Cardiomegaly. 3. Low lung volumes with bilateral perihilar and left base atelectasis/infiltrates. Small left pleural effusion.  Electronically Signed   By: Thomas  Register   On: 05/18/2019 06:38   Dg Chest Portable 1 View  Result Date: 05/17/2019 CLINICAL DATA:  Intubation EXAM: PORTABLE CHEST 1 VIEW COMPARISON:  07/08/2015 FINDINGS: Endotracheal tube is positioned with tip in the right mainstem bronchus. Esophagogastric tube with tip and side port below the diaphragm. Cardiomegaly. Heterogeneous airspace opacity of the left midlung. IMPRESSION: 1. Endotracheal tube is positioned with tip in the right mainstem bronchus. Tube tip is retracted to appropriate position on subsequent radiographs time stamped 4:52 p.m. 2.  Cardiomegaly. 3. Heterogeneous airspace opacity of the left midlung, concerning for infection or aspiration. Electronically Signed   By: Alex  Bibbey M.D.   On: 05/17/2019 17:05   Dg Chest Portable 1 View  Result Date: 05/17/2019 CLINICAL DATA:  Intubation EXAM: PORTABLE CHEST 1 VIEW COMPARISON:  None. FINDINGS: The endotracheal tube has been retracted and is now 2 cm above the level of the carina. NG tube is seen below the diaphragm. Cardiomediastinal silhouette is unchanged. The lungs are clear. IMPRESSION: Endotracheal tube tip 2 cm above the carina. NG tube below the level of the diaphragm. Electronically Signed   By: Bindu  Avutu M.D.   On: 05/17/2019 17:05   Dg Chest Portable 1 View  Result Date: 05/17/2019 CLINICAL DATA:  Intubation EXAM: PORTABLE CHEST 1 VIEW COMPARISON:  Radiograph same day FINDINGS: ET tube has been retracted now with at the level of the carina, at the junction of the right mainstem bronchus. NG tube is seen below the level of the diaphragm. The lungs are clear. Cardiomediastinal silhouette is unchanged. IMPRESSION: ET tube has been retracted now seen at the level of the carina just at the junction of the right mainstem bronchus. NG tube below the level of the diaphragm Electronically Signed   By: Bindu  Avutu M.D.   On: 05/17/2019 17:04    Cardiac Studies   Cath films personally  reviewed  Patient Profile     58 y.o. female s/p PCI to the RCA.  Residual disease in the circ  Assessment &   Plan    1) DAPT and secondary prevention for her MI.  Plan for PCI tomorrow of circ.  2) VT arrest.  Stop Amio. Start metoprolol.  3) Extubating later today per CCM.  For questions or updates, please contact [place] Please consult [email or web address] for contact info under        Signed, Larae Grooms, MD  05/18/2019, 10:27 AM

## 2019-05-18 NOTE — Progress Notes (Signed)
Echocardiogram 2D Echocardiogram has been performed.  Oneal Deputy Christyna Letendre 05/18/2019, 8:38 AM

## 2019-05-18 NOTE — Procedures (Signed)
Extubation Procedure Note  Patient Details:   Name: Brooke Rivera DOB: [birth year] MRN: [id number]   Airway Documentation:   Patient extubated per orders and placed on 6lpm humidified oxygen. Patient had positive cuff leak prior to extubation. Patient has strong cough and able to voice. No stridor noted. Will continue to monitor. Vent end date: 05/18/19 Vent end time: 0850   Evaluation  O2 sats: stable throughout Complications: No apparent complications Patient did tolerate procedure well. Bilateral Breath Sounds: Diminished, Clear   Yes  Ander Purpura 05/18/2019, 8:52 AM

## 2019-05-18 NOTE — Progress Notes (Signed)
Patient extubated to [place] per MD orders. 50 cc Fentanyl wasted with Nat Math, RN as witness.

## 2019-05-18 NOTE — Progress Notes (Signed)
CARDIAC REHAB PHASE I   PRE:  Rate/Rhythm: 56 SR  BP:  Supine:   Sitting: 116/71  Standing:    SaO2: 6L 100%  MODE:  Ambulation: 180 ft   POST:  Rate/Rhythm: 77 SR  BP:  Supine: 110/97,  116/77  Sitting:   Standing:    SaO2: 98-99% 4L 1340-1410 Notified by RN that pt extubated and has been in chair. We walked pt 180 ft starting out on 6L and quickly going to 4L which pt tolerated well. Stopped once at 90 ft to check sats. Pt used rolling walker, gait belt use and asst x 2. Denied CP and dizziness. To bed after walk. Left on 4L and notified pt's RN.   Graylon Good, RN BSN  05/18/2019 2:07 PM

## 2019-05-18 NOTE — Progress Notes (Signed)
Progress Note  Patient Name: Brooke Rivera Date of Encounter: 05/18/2019  Primary Cardiologist: No primary care provider on file. Dr. Claiborne Billings  Subjective   No chest pain.  Some NSVT on tele noted.  On IV Amio.  Inpatient Medications    Scheduled Meds: . aspirin  81 mg Oral Daily  . atorvastatin  80 mg Oral q1800  . chlorhexidine gluconate (MEDLINE KIT)  15 mL Mouth Rinse BID  . Chlorhexidine Gluconate Cloth  6 each Topical Daily  . influenza vac split quadrivalent PF  0.5 mL Intramuscular Tomorrow-1000  . levothyroxine  50 mcg Per Tube QAC breakfast  . mouth rinse  15 mL Mouth Rinse 10 times per day  . metoprolol tartrate  25 mg Oral BID  . pantoprazole (PROTONIX) IV  40 mg Intravenous Daily  . pneumococcal 23 valent vaccine  0.5 mL Intramuscular Tomorrow-1000  . sodium chloride flush  3 mL Intravenous Q12H  . ticagrelor  90 mg Oral BID   Continuous Infusions: . sodium chloride    . sodium chloride 100 mL/hr at 05/18/19 0900  . amiodarone     PRN Meds: sodium chloride, acetaminophen, acetaminophen, albuterol, fentaNYL, midazolam, nitroGLYCERIN, ondansetron (ZOFRAN) IV, sodium chloride flush   Vital Signs    Vitals:   05/18/19 0700 05/18/19 0729 05/18/19 0800 05/18/19 0900  BP: 114/76 114/76 (!) 106/59 (!) 148/65  Pulse: 70 71 68 78  Resp: _0 (!) 31  Temp:      TempSrc:      SpO2: 99% 99% 98% 96%  Weight:      Height:        Intake/Output Summary (Last 24 hours) at 05/18/2019 1027 Last data filed at 05/18/2019 0900 Gross per 24 hour  Intake 2353.5 ml  Output 700 ml  Net 1653.5 ml   Last 3 Weights 05/18/2019 05/17/2019 04/02/2019  Weight (lbs) 131 lb 13.4 oz 144 lb 10 oz 144 lb 9.6 oz  Weight (kg) 59.8 kg 65.6 kg 65.59 kg      Telemetry    NSR, NSVT - Personally Reviewed  ECG    NSR, prolonged QT - Personally Reviewed  Physical Exam   GEN: No acute distress.  Intubated, but awake Neck: No JVD Cardiac: RRR, no murmurs, rubs, or gallops.   Respiratory: Clear to auscultation bilaterally. GI: Soft, nontender, non-distended  MS: No edema; No deformity. RIght groin without hematoma Neuro:  Nonfocal  Psych: Normal affect   Labs    High Sensitivity Troponin:   Recent Labs  Lab 05/17/19 1616 05/17/19 1921 05/17/19 2218 05/18/19 0112  TROPONINIHS 154* 9,016* >27,000* >27,000*      Chemistry Recent Labs  Lab 05/17/19 1616 05/17/19 1640 05/17/19 2026 05/18/19 0112  NA 137 137 138 137  K 3.6 4.1 3.5 3.3*  CL 102 102  --  104  CO2 22  --   --  20*  GLUCOSE 111* 105*  --  126*  BUN 12 14  --  9  CREATININE 0.85 0.70  --  0.77  CALCIUM 9.2  --   --  8.4*  PROT  --   --   --  6.1*  ALBUMIN  --   --   --  3.3*  AST  --   --   --  179*  ALT  --   --   --  81*  ALKPHOS  --   --   --  44  BILITOT  --   --   --  0.7  GFRNONAA >60  --   --  >60  GFRAA >60  --   --  >60  ANIONGAP 13  --   --  13     Hematology Recent Labs  Lab 05/17/19 1616 05/17/19 1640 05/17/19 2026 05/18/19 0112  WBC 8.3  --   --  12.4*  RBC 5.16*  --   --  4.49  HGB 12.9 14.6 12.2 11.0*  HCT 41.2 43.0 36.0 35.0*  MCV 79.8*  --   --  78.0*  MCH 25.0*  --   --  24.5*  MCHC 31.3  --   --  31.4  RDW 15.3  --   --  15.3  PLT 324  --   --  357    BNPNo results for input(s): BNP, PROBNP in the last 168 hours.   DDimer No results for input(s): DDIMER in the last 168 hours.   Radiology    Dg Chest Port 1 View  Result Date: 05/18/2019 CLINICAL DATA:  Intubation.  Respiratory failure. EXAM: PORTABLE CHEST 1 VIEW COMPARISON:  05/17/2019. FINDINGS: Endotracheal tube and NG tube in stable position. Cardiomegaly. Low lung volumes with bilateral perihilar and left base atelectasis/infiltrates. Small left pleural effusion. No pneumothorax. Thoracic spine scoliosis. IMPRESSION: 1.  Lines and tubes in stable position. 2.  Cardiomegaly. 3. Low lung volumes with bilateral perihilar and left base atelectasis/infiltrates. Small left pleural effusion.  Electronically Signed   By: Marcello Moores  Register   On: 05/18/2019 06:38   Dg Chest Portable 1 View  Result Date: 05/17/2019 CLINICAL DATA:  Intubation EXAM: PORTABLE CHEST 1 VIEW COMPARISON:  07/08/2015 FINDINGS: Endotracheal tube is positioned with tip in the right mainstem bronchus. Esophagogastric tube with tip and side port below the diaphragm. Cardiomegaly. Heterogeneous airspace opacity of the left midlung. IMPRESSION: 1. Endotracheal tube is positioned with tip in the right mainstem bronchus. Tube tip is retracted to appropriate position on subsequent radiographs time stamped 4:52 p.m. 2.  Cardiomegaly. 3. Heterogeneous airspace opacity of the left midlung, concerning for infection or aspiration. Electronically Signed   By: Eddie Candle M.D.   On: 05/17/2019 17:05   Dg Chest Portable 1 View  Result Date: 05/17/2019 CLINICAL DATA:  Intubation EXAM: PORTABLE CHEST 1 VIEW COMPARISON:  None. FINDINGS: The endotracheal tube has been retracted and is now 2 cm above the level of the carina. NG tube is seen below the diaphragm. Cardiomediastinal silhouette is unchanged. The lungs are clear. IMPRESSION: Endotracheal tube tip 2 cm above the carina. NG tube below the level of the diaphragm. Electronically Signed   By: Prudencio Pair M.D.   On: 05/17/2019 17:05   Dg Chest Portable 1 View  Result Date: 05/17/2019 CLINICAL DATA:  Intubation EXAM: PORTABLE CHEST 1 VIEW COMPARISON:  Radiograph same day FINDINGS: ET tube has been retracted now with at the level of the carina, at the junction of the right mainstem bronchus. NG tube is seen below the level of the diaphragm. The lungs are clear. Cardiomediastinal silhouette is unchanged. IMPRESSION: ET tube has been retracted now seen at the level of the carina just at the junction of the right mainstem bronchus. NG tube below the level of the diaphragm Electronically Signed   By: Prudencio Pair M.D.   On: 05/17/2019 17:04    Cardiac Studies   Cath films personally  reviewed  Patient Profile     58 y.o. female s/p PCI to the RCA.  Residual disease in the circ  Assessment &  Plan    1) DAPT and secondary prevention for her MI.  Plan for PCI tomorrow of circ.  2) VT arrest.  Stop Amio. Start metoprolol.  3) Extubating later today per CCM.  For questions or updates, please contact [place] Please consult [email or web address] for contact info under        Signed, Larae Grooms, MD  05/18/2019, 10:27 AM

## 2019-05-18 NOTE — Evaluation (Signed)
Clinical/Bedside Swallow Evaluation Patient Details  Name: Brooke Rivera MRN: [id number] Date of Birth: 1960/11/07  Today's Date: 05/18/2019 Time: SLP Start Time (ACUTE ONLY): 25 SLP Stop Time (ACUTE ONLY): 1415 SLP Time Calculation (min) (ACUTE ONLY): 13 min  Past Medical History:  Past Medical History:  Diagnosis Date  . Allergy    mild   . Depression    rx not taking at this time  . Goiter, lymphadenoid    thyroid  . Hx of adenomatous polyp of colon 05/26/2017  . Hyperlipidemia   . Hypertension    Past Surgical History:  Past Surgical History:  Procedure Laterality Date  . COLONOSCOPY  04/2017   1 adenoma  . CORONARY/GRAFT ACUTE MI REVASCULARIZATION N/A 05/17/2019   Procedure: Coronary/Graft Acute MI Revascularization;  Surgeon: Troy Sine, MD;  Location: [facility];  Service: Cardiovascular;  Laterality: N/A;  . LEFT HEART CATH AND CORONARY ANGIOGRAPHY N/A 05/17/2019   Procedure: LEFT HEART CATH AND CORONARY ANGIOGRAPHY;  Surgeon: Troy Sine, MD;  Location: [facility];  Service: Cardiovascular;  Laterality: N/A;  . THYROIDECTOMY Bilateral 07/08/2015   Procedure: BILATERAL THYROIDECTOMY;  Surgeon: Ruby Cola, MD;  Location: [facility] OR;  Service: ENT;  Laterality: Bilateral;   HPI:  58 year old female with PMH below who presents to PCCM with respiratory failure after cardiac arrest.  Patient presented to the hospital with CP and was noted to have a STEMI.  Patient was being transferred to her room prior to a cath lab visit when she suffered a cardiac arrest.  CPR x5 minutes and patient was intubated. Extubated 9-21   Assessment / Plan / Recommendation Clinical Impression  Oropharyngeal swallow appears intact. 3 ounce water challenge unremarkable. Swallow initiation per palpation appeared swift. Vocal quality remained clear, pt reports mild baseline dysphonia. No overt s/sx of aspiration were exhibited with any POs. Recommend regular thin liquid diet.     SLP Visit Diagnosis: Dysphagia, unspecified (R13.10)    Aspiration Risk  Mild aspiration risk    Diet Recommendation  Regular, thin liquids  Medication Administration: Whole meds with liquid    Other  Recommendations Oral Care Recommendations: Oral care BID   Follow up Recommendations   No skilled ST needs      Frequency and Duration  N/A          Prognosis   Good     Swallow Study   General Date of Onset: 05/18/19 HPI: 58 year old female with PMH below who presents to PCCM with respiratory failure after cardiac arrest.  Patient presented to the hospital with CP and was noted to have a STEMI.  Patient was being transferred to her room prior to a cath lab visit when she suffered a cardiac arrest.  CPR x5 minutes and patient was intubated. Extubated 9-21 Type of Study: Bedside Swallow Evaluation Previous Swallow Assessment: none on file Diet Prior to this Study: NPO Temperature Spikes Noted: No Respiratory Status: Nasal cannula History of Recent Intubation: Yes Length of Intubations (days): 1 days Date extubated: 05/18/19 Behavior/Cognition: Alert;Cooperative;Pleasant mood Oral Cavity Assessment: Within Functional Limits Oral Care Completed by SLP: No Oral Cavity - Dentition: Adequate natural dentition(some missing teeth) Vision: Functional for self-feeding Self-Feeding Abilities: Able to feed self Patient Positioning: Upright in bed Baseline Vocal Quality: Hoarse(reports voice is at baseline; mild dysphonia noted) Volitional Swallow: Able to elicit    Oral/Motor/Sensory Function Overall Oral Motor/Sensory Function: Within functional limits   Ice Chips Ice chips: Within functional  limits Presentation: Self Fed   Thin Liquid Thin Liquid: Within functional limits Presentation: Cup;Straw    Nectar Thick Nectar Thick Liquid: Not tested   Honey Thick Honey Thick Liquid: Not tested   Puree Puree: Within functional limits   Solid     Solid: Within functional  limits Presentation: [place] MA, CCC-SLP Acute Rehabilitation Services  05/18/2019,2:19 PM

## 2019-05-18 NOTE — TOC Benefit Eligibility Note (Signed)
Transition of Care [facility]) Benefit Eligibility Note    Patient Details  Name: Brooke Rivera MRN: [id number] Date of Birth: [birth year]   Medication/Dose: Kary Kos  90 MG BID        Prescription Coverage Preferred Pharmacy: [facility]              Additional Notes: MEDICAID [place] ACCESS, EFF-DATE : 02/24/2018,   CO-PAY- $3.90  FOR EACH PRESCRIPTION    Memory Argue Phone Number: 05/18/2019, 12:19 PM

## 2019-05-18 NOTE — Progress Notes (Signed)
NAME:  Brooke Rivera, MRN:  [id number], DOB:  Mar 21, 1961, LOS: 1 ADMISSION DATE:  05/17/2019, CONSULTATION DATE:  05/17/19 REFERRING MD:  Card Lovena Le, CHIEF COMPLAINT:  Cardiac arrest and respiratory failure   Brief History   58 year old female with PMH below who presents to PCCM with respiratory failure after cardiac arrest.  Patient presented to the hospital with CP and was noted to have a STEMI.  Patient was being transferred to her room prior to a cath lab visit when she suffered a cardiac arrest.  CPR x5 minutes and patient was intubated.  Post code she was alert and interactive.  But given the cath lab visit pending patient was left intubated and PCCM was called on consultation for critical care and vent management.  Patient is currently sedated and intubated so further history is available.  History of present illness   58 year old female with PMH below who presents to PCCM with respiratory failure after cardiac arrest.  Patient presented to the hospital with CP and was noted to have a STEMI.  Patient was being transferred to her room prior to a cath lab visit when she suffered a cardiac arrest.  CPR x5 minutes and patient was intubated.  Post code she was alert and interactive.  But given the cath lab visit pending patient was left intubated and PCCM was called on consultation for critical care and vent management.  Patient is currently sedated and intubated so further history is available.  Past Medical History  HTN [facility] Events   05/17/19 cardiac arrest, intubation and cath lab visit  Consults:  PCCM Cardiology  Procedures:  Cath 9/20>>> ETT 9/20>>>  Significant Diagnostic Tests:  Cath 9/20>>>  Micro Data:  N/A  Antimicrobials:  N/A   Interim history/subjective:  No events overnight, no new complaints  Objective   Blood pressure (!) 148/65, pulse 78, temperature 98.4 F (36.9 C), temperature source Oral, resp. rate (!) 31, height 5\' 1"  (1.549  m), weight 59.8 kg, SpO2 96 %.    Vent Mode: PRVC FiO2 (%):  [50 %-100 %] 50 % Set Rate:  [18 bmp] 18 bmp Vt Set:  [380 mL-420 mL] 380 mL PEEP:  [5 cmH20] 5 cmH20 Plateau Pressure:  [19 cmH20-22 cmH20] 22 cmH20   Intake/Output Summary (Last 24 hours) at 05/18/2019 0944 Last data filed at 05/18/2019 0900 Gross per 24 hour  Intake 2353.5 ml  Output 700 ml  Net 1653.5 ml   Filed Weights   05/17/19 1634 05/18/19 0500  Weight: 65.6 kg 59.8 kg    Examination: General: Well appearing, NAD, weaning well overnight HENT: [place]/AT, PERRL, EOM-I and MMM Lungs: CTA bilaterally Cardiovascular: RRR, Nl S1/S2 and -M/R/G Abdomen: Soft, NT, ND and +BS Extremities: -edema and -tenderness Neuro: Awake and interactive, moving all ext to command Skin: Intact  I reviewed CXR myself, ETT ok and no acute disease noted  Discussed with RT  Resolved Hospital Problem list   N/A  Assessment & Plan:  58 year old female with PMH of HTN and hyperlipidemia presenting with CP, diagnosed with STEMI then suffered a cardiac arrest and PCCM called for vent and medical management.  Cardiac arrest and STEMI:  - Tele monitoring  - Recommendations per cards  VDRF:  - Extubate  - PT/OT  - Titrate O2 for sat of 88-92%  - D/C further ABG  - IS and flutter valve  Pulmonary edema:  - Lasix as ordered  - KVO IVF  Hypothyroidism:  - Synthroid 50 PO daily, unsure how much is taking  - TSH and free T4 noted, maintain current dose  HTN:  - Anti HTN once able to take PO  PCCM will sign off, please call back if needed  Labs   CBC: Recent Labs  Lab 05/17/19 1616 05/17/19 1640 05/17/19 2026 05/18/19 0112  WBC 8.3  --   --  12.4*  HGB 12.9 14.6 12.2 11.0*  HCT 41.2 43.0 36.0 35.0*  MCV 79.8*  --   --  78.0*  PLT 324  --   --  [id number]    Basic Metabolic Panel: Recent Labs  Lab 05/17/19 1616 05/17/19 1640 05/17/19 2026 05/18/19 0112  NA 137 137 138 137  K 3.6 4.1 3.5 3.3*  CL 102 102  --  104   CO2 22  --   --  20*  GLUCOSE 111* 105*  --  126*  BUN 12 14  --  9  CREATININE 0.85 0.70  --  0.77  CALCIUM 9.2  --   --  8.4*   GFR: Estimated Creatinine Clearance: 62.1 mL/min (by C-G formula based on SCr of 0.77 mg/dL). Recent Labs  Lab 05/17/19 1616 05/18/19 0112  WBC 8.3 12.4*    Liver Function Tests: Recent Labs  Lab 05/18/19 0112  AST 179*  ALT 81*  ALKPHOS 44  BILITOT 0.7  PROT 6.1*  ALBUMIN 3.3*   No results for input(s): LIPASE, AMYLASE in the last 168 hours. No results for input(s): AMMONIA in the last 168 hours.  ABG    Component Value Date/Time   PHART 7.392 05/17/2019 2026   PCO2ART 37.6 05/17/2019 2026   PO2ART 144.0 (H) 05/17/2019 2026   HCO3 22.9 05/17/2019 2026   TCO2 24 05/17/2019 2026   ACIDBASEDEF 2.0 05/17/2019 2026   O2SAT 99.0 05/17/2019 2026     Coagulation Profile: No results for input(s): INR, PROTIME in the last 168 hours.  Cardiac Enzymes: No results for input(s): CKTOTAL, CKMB, CKMBINDEX, TROPONINI in the last 168 hours.  HbA1C: Hgb A1c MFr Bld  Date/Time Value Ref Range Status  05/18/2019 01:12 AM 5.7 (H) 4.8 - 5.6 % Final    Comment:    (NOTE) Pre diabetes:          5.7%-6.4% Diabetes:              >6.4% Glycemic control for   <7.0% adults with diabetes     CBG: No results for input(s): GLUCAP in the last 168 hours.  The patient is critically ill with multiple organ systems failure and requires high complexity decision making for assessment and support, frequent evaluation and titration of therapies, application of advanced monitoring technologies and extensive interpretation of multiple databases.   Critical Care Time devoted to patient care services described in this note is  31  Minutes. This time reflects time of care of this signee Dr Jennet Maduro. This critical care time does not reflect procedure time, or teaching time or supervisory time of PA/NP/Med student/Med Resident etc but could involve care discussion  time.  Rush Farmer, M.D. [facility] Pulmonary/Critical Care Medicine. Pager: [phone]. After hours pager: [phone].

## 2019-05-19 ENCOUNTER — Encounter (HOSPITAL_COMMUNITY)
Admission: EM | Disposition: A | Discharge status: Home / Self Care | Payer: Self-pay | Source: Home / Self Care | Attending: Cardiovascular Disease

## 2019-05-19 DIAGNOSIS — I25119 Atherosclerotic heart disease of native coronary artery with unspecified angina pectoris: Secondary | ICD-10-CM

## 2019-05-19 DIAGNOSIS — I10 Essential (primary) hypertension: Secondary | ICD-10-CM

## 2019-05-19 HISTORY — PX: LEFT HEART CATH AND CORONARY ANGIOGRAPHY: CATH118249

## 2019-05-19 HISTORY — PX: CORONARY STENT INTERVENTION: CATH118234

## 2019-05-19 LAB — BASIC METABOLIC PANEL
Anion gap: 11 (ref 5–15)
BUN: 6 mg/dL (ref 6–20)
CO2: 23 mmol/L (ref 22–32)
Calcium: 8.3 mg/dL — ABNORMAL LOW (ref 8.9–10.3)
Chloride: 103 mmol/L (ref 98–111)
Creatinine, Ser: 0.8 mg/dL (ref 0.44–1.00)
GFR calc Af Amer: >60 mL/min (ref >60)
GFR calc non Af Amer: >60 mL/min (ref >60)
Glucose, Bld: 93 mg/dL (ref 70–99)
Potassium: 3.5 mmol/L (ref 3.5–5.1)
Sodium: 137 mmol/L (ref 135–145)

## 2019-05-19 LAB — POCT ACTIVATED CLOTTING TIME: Activated Clotting Time: 329 seconds

## 2019-05-19 SURGERY — CORONARY STENT INTERVENTION
Anesthesia: LOCAL

## 2019-05-19 MED ORDER — NITROGLYCERIN 1 MG/10 ML FOR IR/CATH LAB
INTRA_ARTERIAL | Status: AC
  Filled 2019-05-19: qty 10

## 2019-05-19 MED ORDER — VERAPAMIL HCL 2.5 MG/ML IV SOLN
INTRAVENOUS | Status: DC | PRN
  Administered 2019-05-19: 10:00:00 10 mL via INTRA_ARTERIAL

## 2019-05-19 MED ORDER — MIDAZOLAM HCL 2 MG/2ML IJ SOLN
INTRAMUSCULAR | Status: DC | PRN
  Administered 2019-05-19: 1 mg via INTRAVENOUS

## 2019-05-19 MED ORDER — IOHEXOL 350 MG/ML SOLN
INTRAVENOUS | Status: DC | PRN
  Administered 2019-05-19: 11:00:00 110 mL via INTRA_ARTERIAL

## 2019-05-19 MED ORDER — ANGIOPLASTY BOOK
Freq: Once | Status: AC
  Administered 2019-05-19
  Filled 2019-05-19: qty 1

## 2019-05-19 MED ORDER — THE SENSUOUS HEART BOOK
Freq: Once | Status: AC
  Administered 2019-05-19
  Filled 2019-05-19: qty 1

## 2019-05-19 MED ORDER — VERAPAMIL HCL 2.5 MG/ML IV SOLN
INTRAVENOUS | Status: AC
  Filled 2019-05-19: qty 2

## 2019-05-19 MED ORDER — HYDRALAZINE HCL 20 MG/ML IJ SOLN: 10.0000 mg | INTRAMUSCULAR | Status: AC | PRN

## 2019-05-19 MED ORDER — SODIUM CHLORIDE 0.9% FLUSH
3.0000 mL | Freq: Two times a day (BID) | INTRAVENOUS | Status: DC
  Administered 2019-05-20 – 2019-05-21 (×2): 3 mL via INTRAVENOUS

## 2019-05-19 MED ORDER — FENTANYL CITRATE (PF) 100 MCG/2ML IJ SOLN
INTRAMUSCULAR | Status: AC
  Filled 2019-05-19: qty 2

## 2019-05-19 MED ORDER — SODIUM CHLORIDE 0.9 % IV SOLN
INTRAVENOUS | Status: AC
  Administered 2019-05-19: 15:00:00 via INTRAVENOUS

## 2019-05-19 MED ORDER — HEPARIN (PORCINE) IN NACL 1000-0.9 UT/500ML-% IV SOLN
INTRAVENOUS | Status: DC | PRN
  Administered 2019-05-19 (×3): 500 mL

## 2019-05-19 MED ORDER — HEPARIN SODIUM (PORCINE) 1000 UNIT/ML IJ SOLN
INTRAMUSCULAR | Status: AC
  Filled 2019-05-19: qty 1

## 2019-05-19 MED ORDER — HEART ATTACK BOUNCING BOOK
Freq: Once | Status: AC
  Administered 2019-05-19
  Filled 2019-05-19: qty 1

## 2019-05-19 MED ORDER — FENTANYL CITRATE (PF) 100 MCG/2ML IJ SOLN
INTRAMUSCULAR | Status: DC | PRN
  Administered 2019-05-19: 25 ug via INTRAVENOUS

## 2019-05-19 MED ORDER — SODIUM CHLORIDE 0.9 % IV SOLN: 250.0000 mL | INTRAVENOUS | Status: DC | PRN

## 2019-05-19 MED ORDER — SODIUM CHLORIDE 0.9% FLUSH: 3.0000 mL | INTRAVENOUS | Status: DC | PRN

## 2019-05-19 MED ORDER — POTASSIUM CHLORIDE CRYS ER 20 MEQ PO TBCR
40.0000 meq | EXTENDED_RELEASE_TABLET | Freq: Once | ORAL | Status: AC
  Administered 2019-05-19: 40 meq via ORAL
  Filled 2019-05-19: qty 2

## 2019-05-19 MED ORDER — LABETALOL HCL 5 MG/ML IV SOLN: 10.0000 mg | INTRAVENOUS | Status: AC | PRN

## 2019-05-19 MED ORDER — HEPARIN (PORCINE) IN NACL 1000-0.9 UT/500ML-% IV SOLN
INTRAVENOUS | Status: AC
  Filled 2019-05-19: qty 1000

## 2019-05-19 MED ORDER — HEPARIN SODIUM (PORCINE) 1000 UNIT/ML IJ SOLN
INTRAMUSCULAR | Status: DC | PRN
  Administered 2019-05-19: 6000 [IU] via INTRAVENOUS

## 2019-05-19 MED ORDER — MIDAZOLAM HCL 2 MG/2ML IJ SOLN
INTRAMUSCULAR | Status: AC
  Filled 2019-05-19: qty 2

## 2019-05-19 MED ORDER — LIDOCAINE HCL (PF) 1 % IJ SOLN
INTRAMUSCULAR | Status: AC
  Filled 2019-05-19: qty 30

## 2019-05-19 MED ORDER — LIDOCAINE HCL (PF) 1 % IJ SOLN
INTRAMUSCULAR | Status: DC | PRN
  Administered 2019-05-19: 2 mL

## 2019-05-19 MED ORDER — NITROGLYCERIN 1 MG/10 ML FOR IR/CATH LAB
INTRA_ARTERIAL | Status: DC | PRN
  Administered 2019-05-19 (×2): 200 ug via INTRACORONARY

## 2019-05-19 SURGICAL SUPPLY — 23 items
BALLN SAPPHIRE 2.25X20 (BALLOONS) ×2
BALLN ~~LOC~~ EMERGE MR 2.5X8 (BALLOONS) ×2
BALLOON SAPPHIRE 2.25X20 (BALLOONS) ×1 IMPLANT
BALLOON ~~LOC~~ EMERGE MR 2.5X8 (BALLOONS) ×1 IMPLANT
BRACE RADIAL COMPRESSION RADST (HEMOSTASIS) ×2 IMPLANT
CATH LAUNCHER 5F EBU3.0 (CATHETERS) ×1 IMPLANT
CATH LAUNCHER 5F JR4 (CATHETERS) ×2 IMPLANT
CATH LAUNCHER 6FR EBU 3 (CATHETERS) ×2 IMPLANT
CATHETER LAUNCHER 5F EBU3.0 (CATHETERS) ×2
DEVICE RAD COMP TR BAND LRG (VASCULAR PRODUCTS) ×2 IMPLANT
GLIDESHEATH SLEND SS 6F .021 (SHEATH) ×2 IMPLANT
GUIDEWIRE INQWIRE 1.5J.035X260 (WIRE) ×1 IMPLANT
INQWIRE 1.5J .035X260CM (WIRE) ×2
KIT ENCORE 26 ADVANTAGE (KITS) ×2 IMPLANT
KIT HEART LEFT (KITS) ×2 IMPLANT
PACK CARDIAC CATHETERIZATION (CUSTOM PROCEDURE TRAY) ×2 IMPLANT
SHEATH PROBE COVER 6X72 (BAG) ×2 IMPLANT
STENT SYNERGY DES 2.25X12 (Permanent Stent) ×2 IMPLANT
STENT SYNERGY DES 2.25X28 (Permanent Stent) ×2 IMPLANT
TRANSDUCER W/STOPCOCK (MISCELLANEOUS) ×2 IMPLANT
TUBING CIL FLEX 10 FLL-RA (TUBING) ×2 IMPLANT
WIRE ASAHI PROWATER 180CM (WIRE) ×2 IMPLANT
WIRE RUNTHROUGH .014X180CM (WIRE) ×2 IMPLANT

## 2019-05-19 NOTE — Evaluation (Signed)
Occupational Therapy Evaluation Patient Details Name: Brooke Rivera MRN: [id number] DOB: 11-19-1960 Today's Date: 05/19/2019    History of Present Illness Janell Wiedemann is a 58 y.o. female with past medical history of HTN, HLD, and Hypothyroidism who presented to [facility] ED on 05/17/2019 for evaluation of chest pain and found to have a STEMI. S/p coronary stent intervention 05/19/19.   Clinical Impression   PTA patient independent. Admitted for above and limited by problem list below, including decreased activity tolerance, generalized weakness and decreased functional use of R UE. Patient currently requires min assist for UB/LB ADLs, supervision for transfers and in room mobility. Initiated education on energy conservation techniques. Believe she will progress well. She will benefit from continued OT services while admitted to maximize independence and safety with ADLs/mobility, but anticipate no further needs after dc.  Plans to have support from daughter as needed.     Follow Up Recommendations  No OT follow up;Supervision - Intermittent    Equipment Recommendations  3 in 1 bedside commode    Recommendations for Other Services       Precautions / Restrictions Precautions Precautions: Fall Restrictions Weight Bearing Restrictions: No      Mobility Bed Mobility Overal bed mobility: Modified Independent                Transfers Overall transfer level: Needs assistance Equipment used: None Transfers: Sit to/from Stand Sit to Stand: Supervision         General transfer comment: supervision for safety, cueing to avoid pushing with R UE     Balance Overall balance assessment: Mild deficits observed, not formally tested                                         ADL either performed or assessed with clinical judgement   ADL Overall ADL's : Needs assistance/impaired     Grooming: Minimal assistance;Standing   Upper Body Bathing: Minimal  assistance;Standing   Lower Body Bathing: Minimal assistance;Sit to/from stand   Upper Body Dressing : Minimal assistance;Sitting   Lower Body Dressing: Minimal assistance;Sit to/from stand   Toilet Transfer: Supervision/safety;Ambulation   Toileting- Clothing Manipulation and Hygiene: Minimal assistance;Sit to/from stand       Functional mobility during ADLs: Supervision/safety General ADL Comments: limited by decreased functional use of R UE post cath, anticipate she will progress well     Vision         Perception     Praxis      Pertinent Vitals/Pain Pain Assessment: No/denies pain     Hand Dominance Right   Extremity/Trunk Assessment Upper Extremity Assessment Upper Extremity Assessment: RUE deficits/detail RUE Deficits / Details: non weightbearing and no ROM due to recent cath    Lower Extremity Assessment Lower Extremity Assessment: Overall WFL for tasks assessed   Cervical / Trunk Assessment Cervical / Trunk Assessment: Normal   Communication Communication Communication: No difficulties   Cognition Arousal/Alertness: Awake/alert Behavior During Therapy: WFL for tasks assessed/performed Overall Cognitive Status: Within Functional Limits for tasks assessed                                     General Comments  VSS     Exercises     Shoulder Instructions      Home Living Family/patient expects to be discharged  to:: Private residence Living Arrangements: Alone(but going to daughter house ) Available Help at Discharge: Family;Available 24 hours/day Type of Home: Apartment Home Access: Level entry     Home Layout: One level     Bathroom Shower/Tub: Teacher, early years/pre: Standard     Home Equipment: None          Prior Functioning/Environment Level of Independence: Independent        Comments: pt took bus/cab for grocery shopping, didn't use a cane or RW        OT Problem List: Decreased activity  tolerance;Impaired UE functional use;Cardiopulmonary status limiting activity;Decreased knowledge of use of DME or AE      OT Treatment/Interventions: Self-care/ADL training;DME and/or AE instruction;Patient/family education;Therapeutic activities;Energy conservation    OT Goals(Current goals can be found in the care plan section) Acute Rehab OT Goals Patient Stated Goal: home soon OT Goal Formulation: With patient Time For Goal Achievement: 06/02/19 Potential to Achieve Goals: Good  OT Frequency: Min 2X/week   Barriers to D/C:            Co-evaluation              AM-PAC OT "6 Clicks" Daily Activity     Outcome Measure Help from another person eating meals?: None Help from another person taking care of personal grooming?: A Little Help from another person toileting, which includes using toliet, bedpan, or urinal?: A Little Help from another person bathing (including washing, rinsing, drying)?: A Little Help from another person to put on and taking off regular upper body clothing?: A Little Help from another person to put on and taking off regular lower body clothing?: A Little 6 Click Score: 19   End of Session Nurse Communication: Mobility status  Activity Tolerance: Patient tolerated treatment well Patient left: in bed;with call bell/phone within reach;with bed alarm set  OT Visit Diagnosis: Other abnormalities of gait and mobility (R26.89);Other (comment)(decreased act tolerance)                Time: [id number] OT Time Calculation (min): 15 min Charges:  OT General Charges $OT Visit: 1 Visit OT Evaluation $OT Eval Low Complexity: 1 Low  Delight Stare, OT Acute Rehabilitation Services Pager [phone] Office [phone]   Delight Stare 05/19/2019, 4:59 PM

## 2019-05-19 NOTE — Interval H&P Note (Signed)
History and Physical Interval Note:  05/19/2019 9:10 AM  Brooke Rivera  has presented today for surgery, with the diagnosis of cad  - plan for staged PCI.  The various methods of treatment have been discussed with the patient and family. After consideration of risks, benefits and other options for treatment, the patient has consented to  Procedure(s): CORONARY STENT INTERVENTION (N/A)   as a surgical intervention.  The patient's history has been reviewed, patient examined, no change in status, stable for surgery.  I have reviewed the patient's chart and labs.  Questions were answered to the patient's satisfaction.    Cath Lab Visit (complete for each Cath Lab visit)  Clinical Evaluation Leading to the Procedure:   ACS: Yes.  - recent Inferior STEMI with residual 95% LCx  Non-ACS:    Anginal Classification: CCS III  Anti-ischemic medical therapy: Minimal Therapy (1 class of medications)  Non-Invasive Test Results: No non-invasive testing performed  Prior CABG: No previous CABG  Glenetta Hew

## 2019-05-19 NOTE — Evaluation (Signed)
Physical Therapy Evaluation Patient Details Name: Brooke Rivera MRN: [id number] DOB: 23-Aug-1961 Today's Date: 05/19/2019   History of Present Illness  Brooke Rivera is a 58 y.o. female with past medical history of HTN, HLD, and Hypothyroidism who presented to [facility] ED on 05/17/2019 for evaluation of chest pain and found to have a STEMI.   Clinical Impression  Pt tolerated mobility well. Pt SpO2 >91% on 2LO2 via [place] while ambulating, able to maintain >91% on RA at rest. Pt with good home set up and support. Acute PT to cont to follow to progress amb to indep without AD.    Follow Up Recommendations No PT follow up;Supervision/Assistance - 24 hour    Equipment Recommendations  Rolling walker with 5" wheels(may not need)    Recommendations for Other Services       Precautions / Restrictions Precautions Precautions: Fall Restrictions Weight Bearing Restrictions: No      Mobility  Bed Mobility Overal bed mobility: Modified Independent             General bed mobility comments: HOB elevated, no physical assist  Transfers Overall transfer level: Needs assistance Equipment used: None Transfers: Sit to/from Stand Sit to Stand: Supervision         General transfer comment: verbal cues to push up on bed not pull up on walker  Ambulation/Gait Ambulation/Gait assistance: Min guard Gait Distance (Feet): 500 Feet Assistive device: Rolling walker (2 wheeled) Gait Pattern/deviations: WFL(Within Functional Limits) Gait velocity: decreased Gait velocity interpretation: 1.31 - 2.62 ft/sec, indicative of limited community ambulator General Gait Details: no episode of LOB, pt amb in room to bathroom without AD and did well. Used RW to [employer] for [employer]. Pt on 2LO2 via [place] >92%  Stairs            Wheelchair Mobility    Modified Rankin (Stroke Patients Only)       Balance Overall balance assessment: Mild deficits observed, not formally tested                                            Pertinent Vitals/Pain Pain Assessment: 0-10 Pain Score: 2  Pain Location: headache Pain Descriptors / Indicators: Dull Pain Intervention(s): (notified RN)    Home Living Family/patient expects to be discharged to:: Private residence Living Arrangements: Alone(but going home with daughter) Available Help at Discharge: Family;Available 24 hours/day Type of Home: Apartment Home Access: Level entry     Home Layout: One level Home Equipment: None      Prior Function Level of Independence: Independent         Comments: pt took bus/cab for grocery shopping, didn't use a cane or RW     Hand Dominance   Dominant Hand: Right    Extremity/Trunk Assessment   Upper Extremity Assessment Upper Extremity Assessment: Overall WFL for tasks assessed    Lower Extremity Assessment Lower Extremity Assessment: Overall WFL for tasks assessed    Cervical / Trunk Assessment Cervical / Trunk Assessment: Normal  Communication   Communication: No difficulties  Cognition Arousal/Alertness: Awake/alert Behavior During Therapy: WFL for tasks assessed/performed Overall Cognitive Status: Within Functional Limits for tasks assessed  General Comments General comments (skin integrity, edema, etc.): VSS, at rest pt >91% on RA, during amb >91% on 2LO2 vis [place], pt assisted to the bathroom, pt mod I with hygiene and washing of hands    Exercises     Assessment/Plan    PT Assessment Patient needs continued PT services  PT Problem List Decreased strength;Decreased activity tolerance;Decreased balance;Decreased mobility       PT Treatment Interventions DME instruction;Gait training;Stair training;Functional mobility training;Therapeutic activities;Therapeutic exercise;Balance training;Neuromuscular re-education    PT Goals (Current goals can be found in the Care Plan section)   Acute Rehab PT Goals Patient Stated Goal: home soon PT Goal Formulation: With patient Time For Goal Achievement: 06/02/19 Potential to Achieve Goals: Good Additional Goals Additional Goal #1: Pt to score >19 on DGI to indicate minimal falls risk.    Frequency Min 3X/week   Barriers to discharge        Co-evaluation               AM-PAC PT "6 Clicks" Mobility  Outcome Measure Help needed turning from your back to your side while in a flat bed without using bedrails?: None Help needed moving from lying on your back to sitting on the side of a flat bed without using bedrails?: None Help needed moving to and from a bed to a chair (including a wheelchair)?: None Help needed standing up from a chair using your arms (e.g., wheelchair or bedside chair)?: None Help needed to walk in hospital room?: A Little Help needed climbing 3-5 steps with a railing? : A Little 6 Click Score: 22    End of Session Equipment Utilized During Treatment: Oxygen(2LO2 via [place]) Activity Tolerance: Patient tolerated treatment well Patient left: in chair;with chair alarm set Nurse Communication: Mobility status PT Visit Diagnosis: Difficulty in walking, not elsewhere classified (R26.2)    Time: [id number] PT Time Calculation (min) (ACUTE ONLY): 27 min   Charges:   PT Evaluation $PT Eval Moderate Complexity: 1 Mod PT Treatments $Gait Training: 8-22 mins        Kittie Plater, PT, DPT Acute Rehabilitation Services Pager #: [phone] Office #: [phone]   Berline Lopes 05/19/2019, 8:39 AM

## 2019-05-19 NOTE — Progress Notes (Signed)
Progress Note  Patient Name: Brooke Rivera Date of Encounter: 05/19/2019  Primary Cardiologist: No primary care provider on file. Dr. Claiborne Billings  Subjective   NO chest pain.  Rhythm improved from yesterday.  Less NSVT.  Inpatient Medications    Scheduled Meds: . [MAR Hold] aspirin  81 mg Oral Daily  . [MAR Hold] atorvastatin  80 mg Oral q1800  . [MAR Hold] Chlorhexidine Gluconate Cloth  6 each Topical Daily  . [MAR Hold] influenza vac split quadrivalent PF  0.5 mL Intramuscular Tomorrow-1000  . [MAR Hold] levothyroxine  50 mcg Per Tube QAC breakfast  . [MAR Hold] mouth rinse  15 mL Mouth Rinse BID  . [MAR Hold] metoprolol tartrate  25 mg Oral BID  . [MAR Hold] pantoprazole (PROTONIX) IV  40 mg Intravenous Daily  . [MAR Hold] pneumococcal 23 valent vaccine  0.5 mL Intramuscular Tomorrow-1000  . [MAR Hold] sodium chloride flush  3 mL Intravenous Q12H  . [MAR Hold] ticagrelor  90 mg Oral BID   Continuous Infusions: . [MAR Hold] sodium chloride    . sodium chloride 1 mL/kg/hr (05/19/19 0550)   PRN Meds: [id number] Hold] sodium chloride, [MAR Hold] acetaminophen, [MAR Hold] acetaminophen, [MAR Hold] albuterol, [MAR Hold] fentaNYL, [MAR Hold] midazolam, [MAR Hold] nitroGLYCERIN, [MAR Hold] ondansetron (ZOFRAN) IV, [MAR Hold] sodium chloride flush   Vital Signs    Vitals:   05/19/19 0700 05/19/19 0732 05/19/19 0800 05/19/19 0920  BP: 130/68  (!) 161/71   Pulse: 75  78   Resp: (!) 21  14   Temp:  99 F (37.2 C)    TempSrc:  Oral    SpO2: 95%  94% 100%  Weight:      Height:        Intake/Output Summary (Last 24 hours) at 05/19/2019 0932 Last data filed at 05/19/2019 0800 Gross per 24 hour  Intake 618.46 ml  Output 650 ml  Net -31.54 ml   Last 3 Weights 05/19/2019 05/18/2019 05/18/2019  Weight (lbs) 128 lb 8.5 oz 128 lb 8.5 oz 131 lb 13.4 oz  Weight (kg) 58.3 kg 58.3 kg 59.8 kg      Telemetry    As above - Personally Reviewed  ECG    None recently  Physical Exam    GEN: No acute distress.   Neck: No JVD Cardiac: RRR, no murmurs, rubs, or gallops.  Respiratory: Clear to auscultation bilaterally. GI: Soft, nontender, non-distended  MS: No edema; No deformity. 2+ right radial pulse Neuro:  Nonfocal  Psych: Normal affect   Labs    High Sensitivity Troponin:   Recent Labs  Lab 05/17/19 1616 05/17/19 1921 05/17/19 2218 05/18/19 0112  TROPONINIHS 154* 9,016* >27,000* >27,000*      Chemistry Recent Labs  Lab 05/17/19 1616 05/17/19 1640 05/17/19 2026 05/18/19 0112 05/19/19 0511  NA 137 137 138 137 137  K 3.6 4.1 3.5 3.3* 3.5  CL 102 102  --  104 103  CO2 22  --   --  20* 23  GLUCOSE 111* 105*  --  126* 93  BUN 12 14  --  9 6  CREATININE 0.85 0.70  --  0.77 0.80  CALCIUM 9.2  --   --  8.4* 8.3*  PROT  --   --   --  6.1*  --   ALBUMIN  --   --   --  3.3*  --   AST  --   --   --  179*  --  ALT  --   --   --  81*  --   ALKPHOS  --   --   --  44  --   BILITOT  --   --   --  0.7  --   GFRNONAA >60  --   --  >60 >60  GFRAA >60  --   --  >60 >60  ANIONGAP 13  --   --  13 11     Hematology Recent Labs  Lab 05/17/19 1616 05/17/19 1640 05/17/19 2026 05/18/19 0112  WBC 8.3  --   --  12.4*  RBC 5.16*  --   --  4.49  HGB 12.9 14.6 12.2 11.0*  HCT 41.2 43.0 36.0 35.0*  MCV 79.8*  --   --  78.0*  MCH 25.0*  --   --  24.5*  MCHC 31.3  --   --  31.4  RDW 15.3  --   --  15.3  PLT 324  --   --  357    BNPNo results for input(s): BNP, PROBNP in the last 168 hours.   DDimer No results for input(s): DDIMER in the last 168 hours.   Radiology    Dg Chest Port 1 View  Result Date: 05/18/2019 CLINICAL DATA:  Intubation.  Respiratory failure. EXAM: PORTABLE CHEST 1 VIEW COMPARISON:  05/17/2019. FINDINGS: Endotracheal tube and NG tube in stable position. Cardiomegaly. Low lung volumes with bilateral perihilar and left base atelectasis/infiltrates. Small left pleural effusion. No pneumothorax. Thoracic spine scoliosis. IMPRESSION: 1.  Lines  and tubes in stable position. 2.  Cardiomegaly. 3. Low lung volumes with bilateral perihilar and left base atelectasis/infiltrates. Small left pleural effusion. Electronically Signed   By: Marcello Moores  Register   On: 05/18/2019 06:38   Dg Chest Portable 1 View  Result Date: 05/17/2019 CLINICAL DATA:  Intubation EXAM: PORTABLE CHEST 1 VIEW COMPARISON:  07/08/2015 FINDINGS: Endotracheal tube is positioned with tip in the right mainstem bronchus. Esophagogastric tube with tip and side port below the diaphragm. Cardiomegaly. Heterogeneous airspace opacity of the left midlung. IMPRESSION: 1. Endotracheal tube is positioned with tip in the right mainstem bronchus. Tube tip is retracted to appropriate position on subsequent radiographs time stamped 4:52 p.m. 2.  Cardiomegaly. 3. Heterogeneous airspace opacity of the left midlung, concerning for infection or aspiration. Electronically Signed   By: Eddie Candle M.D.   On: 05/17/2019 17:05   Dg Chest Portable 1 View  Result Date: 05/17/2019 CLINICAL DATA:  Intubation EXAM: PORTABLE CHEST 1 VIEW COMPARISON:  None. FINDINGS: The endotracheal tube has been retracted and is now 2 cm above the level of the carina. NG tube is seen below the diaphragm. Cardiomediastinal silhouette is unchanged. The lungs are clear. IMPRESSION: Endotracheal tube tip 2 cm above the carina. NG tube below the level of the diaphragm. Electronically Signed   By: Prudencio Pair M.D.   On: 05/17/2019 17:05   Dg Chest Portable 1 View  Result Date: 05/17/2019 CLINICAL DATA:  Intubation EXAM: PORTABLE CHEST 1 VIEW COMPARISON:  Radiograph same day FINDINGS: ET tube has been retracted now with at the level of the carina, at the junction of the right mainstem bronchus. NG tube is seen below the level of the diaphragm. The lungs are clear. Cardiomediastinal silhouette is unchanged. IMPRESSION: ET tube has been retracted now seen at the level of the carina just at the junction of the right mainstem bronchus.  NG tube below the level of the diaphragm Electronically Signed  By: Prudencio Pair M.D.   On: 05/17/2019 17:04    Cardiac Studies   Labs reviewed; K 3.5  Patient Profile     58 y.o. female CAD, inferior MI, recent cardiac arrest  Assessment & Plan    1) Plan PCI of circumflex. Cardiac catheterization was discussed with the patient fully. The patient understands that risks include but are not limited to stroke (1 in 1000), death (1 in 39), kidney failure [usually temporary] (1 in 500), bleeding (1 in 200), allergic reaction [possibly serious] (1 in 200).  The patient understands and is willing to proceed.    Continue DAPT.    BP increased.  Start ACE-I tomorrow.  Do not want to start today since she will be getting dye from cath.  WIll need BP followup and BMet as outpatient.  OK to move to tele post procedure.  For questions or updates, please contact [place] Please consult [email or web address] for contact info under        Signed, Larae Grooms, MD  05/19/2019, 9:32 AM

## 2019-05-19 NOTE — Progress Notes (Signed)
OT Cancellation Note  Patient Details Name: Brooke Rivera MRN: [id number] DOB: 1961/07/18   Cancelled Treatment:    Reason Eval/Treat Not Completed: Patient at procedure or test/ unavailable. Will follow and see as able/appropraite.   Delight Stare, OT Acute Rehabilitation Services Pager [phone] Office [phone]   Delight Stare 05/19/2019, 9:10 AM

## 2019-05-20 ENCOUNTER — Encounter (HOSPITAL_COMMUNITY): Payer: Self-pay | Admitting: Cardiology

## 2019-05-20 DIAGNOSIS — E782 Mixed hyperlipidemia: Secondary | ICD-10-CM

## 2019-05-20 DIAGNOSIS — I441 Atrioventricular block, second degree: Secondary | ICD-10-CM

## 2019-05-20 DIAGNOSIS — I5021 Acute systolic (congestive) heart failure: Secondary | ICD-10-CM

## 2019-05-20 LAB — CBC
HCT: 33.6 % — ABNORMAL LOW (ref 36.0–46.0)
Hemoglobin: 10.6 g/dL — ABNORMAL LOW (ref 12.0–15.0)
MCH: 24.7 pg — ABNORMAL LOW (ref 26.0–34.0)
MCHC: 31.5 g/dL (ref 30.0–36.0)
MCV: 78.3 fL — ABNORMAL LOW (ref 80.0–100.0)
Platelets: 313 10*3/uL (ref 150–400)
RBC: 4.29 MIL/uL (ref 3.87–5.11)
RDW: 15 % (ref 11.5–15.5)
WBC: 10.2 10*3/uL (ref 4.0–10.5)
nRBC: 0 % (ref 0.0–0.2)

## 2019-05-20 LAB — BASIC METABOLIC PANEL
Anion gap: 11 (ref 5–15)
BUN: <5 mg/dL — ABNORMAL LOW (ref 6–20)
CO2: 23 mmol/L (ref 22–32)
Calcium: 8.6 mg/dL — ABNORMAL LOW (ref 8.9–10.3)
Chloride: 104 mmol/L (ref 98–111)
Creatinine, Ser: 0.86 mg/dL (ref 0.44–1.00)
GFR calc Af Amer: >60 mL/min (ref >60)
GFR calc non Af Amer: >60 mL/min (ref >60)
Glucose, Bld: 100 mg/dL — ABNORMAL HIGH (ref 70–99)
Potassium: 3.5 mmol/L (ref 3.5–5.1)
Sodium: 138 mmol/L (ref 135–145)

## 2019-05-20 LAB — URINALYSIS, ROUTINE W REFLEX MICROSCOPIC
Bacteria, UA: NONE SEEN
Bilirubin Urine: NEGATIVE
Glucose, UA: NEGATIVE mg/dL
Ketones, ur: 5 mg/dL — AB
Leukocytes,Ua: NEGATIVE
Nitrite: NEGATIVE
Protein, ur: NEGATIVE mg/dL
Specific Gravity, Urine: 1.013 (ref 1.005–1.030)
pH: 8 (ref 5.0–8.0)

## 2019-05-20 LAB — RAPID URINE DRUG SCREEN, HOSP PERFORMED
Amphetamines: NOT DETECTED
Barbiturates: NOT DETECTED
Benzodiazepines: POSITIVE — AB
Cocaine: NOT DETECTED
Opiates: NOT DETECTED
Tetrahydrocannabinol: POSITIVE — AB

## 2019-05-20 MED ORDER — POTASSIUM CHLORIDE CRYS ER 20 MEQ PO TBCR
40.0000 meq | EXTENDED_RELEASE_TABLET | Freq: Once | ORAL | Status: AC
  Administered 2019-05-20: 40 meq via ORAL
  Filled 2019-05-20: qty 2

## 2019-05-20 MED ORDER — LISINOPRIL 10 MG PO TABS
10.0000 mg | ORAL_TABLET | Freq: Every day | ORAL | Status: DC
  Administered 2019-05-20 – 2019-05-21 (×2): 10 mg via ORAL
  Filled 2019-05-20 (×2): qty 1

## 2019-05-20 NOTE — Care Management Important Message (Signed)
Important Message  Patient Details  Name: Brooke Rivera MRN: [id number] Date of Birth: Jul 03, 1961   Medicare Important Message Given:  Yes     Shelda Altes 05/20/2019, 1:55 PM

## 2019-05-20 NOTE — Progress Notes (Addendum)
    Called by RN with pauses noted on telemetry. Review of telemetry shows intermittent brief episodes of what appears 2nd degree AVB II. Longest pause around 4 sec. Has been on metoprolol 25mg  BID. Received dose this morning. Will stop and follow response. Of note, has been asymptomatic with these episodes. Reviewed with MD.   Barnet Pall, NP-C 05/20/2019, 3:45 PM Pager: [phone]  Mobitz type 2 noted.    Minimal hematuria on u/a.  Jettie Booze, MD

## 2019-05-20 NOTE — Progress Notes (Signed)
CARDIAC REHAB PHASE I   PRE:  Rate/Rhythm: 72SR  BP:  Supine: 154/83  Sitting:   Standing:    SaO2: 98%RA  MODE:  Ambulation: 470 ft   POST:  Rate/Rhythm: 97 SR  BP:  Supine:   Sitting: 151/80  Standing:    SaO2: 97%RA [id number] Pt stated she is sleepy and hasn't had a lot of sleep. Pt walked 470 ft on RA with hand held asst with steady gait. Observed pt walking to bathroom with steady gait. MI education completed with pt who voiced understanding but I feel will need reinforcement. Encouraged pt to read materials when at home. Discussed importance of brilinta with stent and reviewed twice. Discuss NTG use, MI restrictions, smoking cessation, walking for exercise, heart healthy food choices, and CRP 2. Gave pt smoking cessation handout and discussed need to quit. Pt stated she had 5 cigarettes at home and was going to quit after that. Discussed that she already had nicotine out of system but pt seemed adamant that she could quit once she sets her mind to it. Encouraged her to call [phone] as needed. Referred to [place] CRP 2. Did not discuss virtual APP.   Graylon Good, RN BSN  05/20/2019 9:33 AM

## 2019-05-20 NOTE — Discharge Summary (Addendum)
Discharge Summary    Patient ID: Brooke Rivera,  MRN: [id number], DOB/AGE: 1961-04-10 58 y.o.  Admit date: 05/17/2019 Discharge date: 05/21/2019  Primary Care Provider: Rory Percy Primary Cardiologist: Shelva Majestic, MD  Discharge Diagnoses    Principal Problem:   STEMI (ST elevation myocardial infarction) [facility]) Active Problems:   Hyperlipidemia   Hypertension, benign   Hypothyroidism   Ventricular fibrillation [facility])   STEMI involving right coronary artery ([place])   Acute systolic heart failure (HCC)  Allergies Allergies  Allergen Reactions   Cabbage Itching and Swelling    Facial redness    Diagnostic Studies/Procedures    Cath: 05/17/19   Prox RCA lesion is 100% stenosed.  Mid RCA lesion is 55% stenosed.  Prox Cx lesion is 90% stenosed.  Prox LAD to Mid LAD lesion is 30% stenosed.  Post intervention, there is a 0% residual stenosis.  LV end diastolic pressure is mildly elevated.  A stent was successfully placed.   Acute inferior ST segment elevation myocardial infarction complicated by VF cardiac arrest in the emergency room requiring CPR, defibrillation, and epinephrine with resultant development of atrial fibrillation.  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  Left circumflex vessel has a 90 to 95% eccentric stenosis proximally with TIMI-3 flow.  Total acute occlusion of the proximal RCA with initial TIMI 0 flow.  Successful PCI to the RCA proximal occlusion with ultimate insertion of a 2.25 x 20 mm Synergy DES stent postdilated to 2.5 mm with 100% occlusion being reduced to 0%.  There is a 55 to 60% area of diffuse irregularity proximal to and extending into the acute margin with TIMI-3 flow which was not intervened upon.  Transient hypotension probably contributed by RV infarction syndrome which responded to IV fluids and short course of levophed..  Successful   cardioversion back to sinus rhythm with reperfusion and following amiodarone therapy.  RECOMMENDATION: DAPT for minimum of 1 year.  Once patient is in the CCU, Brilinta will be administered and cangrelor can be discontinued.  We will continue bivalirudin until completion of her current second bag.  Plan staged PCI to the proximal left circumflex vessel following stabilization.  [place] lipid-lowering therapy with high potency statin treatment with target LDL less than 70.  Will obtain 2D echo Doppler study to reassess RV and LV function following revascularization.  Avoidance of future cocaine.  Diagnostic Dominance: Right  Cath: 05/19/19   LESION COMPLEX #1: Prox Cx lesion is 90% stenosed - Prox Cx to Mid Cx lesion is 60% stenosed crosses OM1  A drug-eluting stent was successfully placed covering both lesions (crossing OM1) using a STENT SYNERGY DES 2.25X28 -> postdilated with stent balloon to 2.45 mm  Post intervention, there is a 0% residual stenosis.  -------------------------------------------  LESION #2: Ramus lesion is 80% stenosed.  A drug-eluting stent was successfully placed using a STENT SYNERGY DES 2.25X12 -> postdilated to 2.55 mm  Post intervention, there is a 0% residual stenosis.  -------------------------------------  LV end diastolic pressure is moderately elevated.   SUMMARY  Successful two-vessel PCI: Proximal-mid LCx (Synergy DES 2.25 mm x 28 mm - 2.45 mm), ostial RI (Synergy DES 2.25 mm x 12 mm - 2.55 mm)  Continues to have moderately elevated LVEDP of 21 mm.  RECOMMENDATIONS  Transfer to 6 Central post procedure unit after TR band removal.  Continue aggressive risk modification  Further plans per primary service  Glenetta Hew, MD  Diagnostic Dominance: Right  Intervention   TTE: 05/18/19  IMPRESSIONS    1. Left ventricular ejection fraction, by visual estimation, is 45 to 50%. The left ventricle has normal function. Normal left  ventricular size. There is no left ventricular hypertrophy.  2. Basal and mid inferior wall is abnormal.  3. Elevated mean left atrial pressure.  4. Left ventricular diastolic Doppler parameters are consistent with impaired relaxation pattern of LV diastolic filling.  5. Global right ventricle has normal systolic function.The right ventricular size is mildly enlarged. No increase in right ventricular wall thickness.  6. Left atrial size was severely dilated.  7. Right atrial size was normal.  8. The mitral valve is normal in structure. Trace mitral valve regurgitation. No evidence of mitral stenosis.  9. The tricuspid valve is normal in structure. Tricuspid valve regurgitation is mild. 10. The aortic valve is normal in structure. Aortic valve regurgitation was not visualized by color flow Doppler. Structurally normal aortic valve, with no evidence of sclerosis or stenosis. 11. The pulmonic valve was normal in structure. Pulmonic valve regurgitation is not visualized by color flow Doppler. 12. Mildly elevated pulmonary artery systolic pressure. 13. The inferior vena cava is normal in size with <50% respiratory variability, suggesting right atrial pressure of 8 mmHg.  FINDINGS  Left Ventricle: Left ventricular ejection fraction, by visual estimation, is 45 to 50%. The left ventricle has normal function. There is no left ventricular hypertrophy. Normal left ventricular size. Spectral Doppler shows Left ventricular diastolic  Doppler parameters are consistent with impaired relaxation pattern of LV diastolic filling. Elevated mean left atrial pressure.  _____________   History of Present Illness     Brooke Rivera presented to [facility] ED on 05/17/2019 for evaluation of chest pain which started 1 hour prior to arrival with associated diaphoresis and vomiting. By provided history she did report using Cocaine the day prior to admission.   Her initial EKG showed NSR, HR 77, with ST elevation  along the inferior and anterior leads with reciprocal ST depression along the lateral leads. She was taken back to a room but while talking with nursing staff she became unresponsive and lost pulses. Was found to be in ventricular fibrillation and CPR was initiated (lasting 1-2 minutes) with her receiving 1 shock along with 1 dose of Epinephrine. Return rhythm was atrial fibrillation. Was intubated while in the ED. No family was available at the beside to contribute to the history.   Labs showed WBC 8.3, Hgb 12.9, platelets 324, Na+ 137, K+ 4.1, and creatinine 1.02. COVID negative. Troponin pending.   CODE STEMI was activated and she was taken for emergent cardiac catheterization.   Hospital Course     1. STEMI: HsT >27000. Underwent emergent cath 9/21 with PCI/DES x1 to RCA, residual disease in the Lcx and RI. Placed on DAPT with ASA/Brilinta. Underwent staged intervention to Lcx/RI 9/22 with DES x2. No recurrence of chest pain. On medical therapy with ASA, statin, and ACEi. Worked with cardiac rehab, and PT with improvement. No recommendations for home health needs.  2. Cardiac Arrest:Ventricular Fibrillation arrest while in the ED requiring 1-2 minutes of CPR, Epi x1 and shock. Extubated on 9/21. Weaned from [place] to RA. Able to maintain O2 sats.   3. HLD: LDL 90. Placed on high dose statin. Will need LFT/FLP in 6 weeks.   4. ICM: EF noted at 40-45% with basal and mid-inferior WMA. Only on ACEi.  5. Hypothyroidism: TSH 5.527, on synthroid which was reduced to  50 mcg. Will need to follow up PCP regarding further management.   6. 2nd degree AVB II: noted on telemetry while receiving metoprolol. Longest pause around 4 seconds. BB was stopped and this resolved. Will plan for outpatient cardiac monitor.   7. Polysubstance abuse: cessation encouraged.   General: Well developed, well nourished, female appearing in no acute distress. Head: Normocephalic, atraumatic.  Neck: Supple without  bruits, JVD. Lungs:  Resp regular and unlabored, CTA. Heart: RRR, S1, S2, no S3, S4, or murmur; no rub. Abdomen: Soft, non-tender, non-distended with normoactive bowel sounds. No hepatomegaly. No rebound/guarding. No obvious abdominal masses. Extremities: No clubbing, cyanosis, edema. Distal pedal pulses are 2+ bilaterally. Right radial cath site stable without bruising or hematoma Neuro: Alert and oriented X 3. Moves all extremities spontaneously. Psych: Normal affect.  Brooke Rivera was seen by Dr. Irish Lack and determined stable for discharge home. Follow up in the office has been arranged. Medications are listed below.   _____________  Discharge Vitals Blood pressure 136/88, pulse 91, temperature 98.2 F (36.8 C), temperature source Oral, resp. rate 16, height 5\' 1"  (1.549 m), weight 55.4 kg, SpO2 96 %.  Filed Weights   05/19/19 0500 05/20/19 0312 05/21/19 0602  Weight: 58.3 kg 57.2 kg 55.4 kg    Labs & Radiologic Studies    CBC Recent Labs    05/20/19 0326  WBC 10.2  HGB 10.6*  HCT 33.6*  MCV 78.3*  PLT [id number]   Basic Metabolic Panel Recent Labs    05/20/19 0326 05/21/19 0342  NA 138 137  K 3.5 3.5  CL 104 103  CO2 23 22  GLUCOSE 100* 86  BUN <5* 5*  CREATININE 0.86 0.90  CALCIUM 8.6* 9.1   Liver Function Tests No results for input(s): AST, ALT, ALKPHOS, BILITOT, PROT, ALBUMIN in the last 72 hours. No results for input(s): LIPASE, AMYLASE in the last 72 hours. Cardiac Enzymes No results for input(s): CKTOTAL, CKMB, CKMBINDEX, TROPONINI in the last 72 hours. BNP Invalid input(s): POCBNP D-Dimer No results for input(s): DDIMER in the last 72 hours. Hemoglobin A1C No results for input(s): HGBA1C in the last 72 hours. Fasting Lipid Panel No results for input(s): CHOL, HDL, LDLCALC, TRIG, CHOLHDL, LDLDIRECT in the last 72 hours. Thyroid Function Tests No results for input(s): TSH, T4TOTAL, T3FREE, THYROIDAB in the last 72 hours.  Invalid input(s):  FREET3 _____________  Dg Chest Port 1 View  Result Date: 05/18/2019 CLINICAL DATA:  Intubation.  Respiratory failure. EXAM: PORTABLE CHEST 1 VIEW COMPARISON:  05/17/2019. FINDINGS: Endotracheal tube and NG tube in stable position. Cardiomegaly. Low lung volumes with bilateral perihilar and left base atelectasis/infiltrates. Small left pleural effusion. No pneumothorax. Thoracic spine scoliosis. IMPRESSION: 1.  Lines and tubes in stable position. 2.  Cardiomegaly. 3. Low lung volumes with bilateral perihilar and left base atelectasis/infiltrates. Small left pleural effusion. Electronically Signed   By: Marcello Moores  Register   On: 05/18/2019 06:38   Dg Chest Portable 1 View  Result Date: 05/17/2019 CLINICAL DATA:  Intubation EXAM: PORTABLE CHEST 1 VIEW COMPARISON:  07/08/2015 FINDINGS: Endotracheal tube is positioned with tip in the right mainstem bronchus. Esophagogastric tube with tip and side port below the diaphragm. Cardiomegaly. Heterogeneous airspace opacity of the left midlung. IMPRESSION: 1. Endotracheal tube is positioned with tip in the right mainstem bronchus. Tube tip is retracted to appropriate position on subsequent radiographs time stamped 4:52 p.m. 2.  Cardiomegaly. 3. Heterogeneous airspace opacity of the left midlung, concerning for infection or aspiration. Electronically Signed  By: Eddie Candle M.D.   On: 05/17/2019 17:05   Dg Chest Portable 1 View  Result Date: 05/17/2019 CLINICAL DATA:  Intubation EXAM: PORTABLE CHEST 1 VIEW COMPARISON:  None. FINDINGS: The endotracheal tube has been retracted and is now 2 cm above the level of the carina. NG tube is seen below the diaphragm. Cardiomediastinal silhouette is unchanged. The lungs are clear. IMPRESSION: Endotracheal tube tip 2 cm above the carina. NG tube below the level of the diaphragm. Electronically Signed   By: Prudencio Pair M.D.   On: 05/17/2019 17:05   Dg Chest Portable 1 View  Result Date: 05/17/2019 CLINICAL DATA:  Intubation  EXAM: PORTABLE CHEST 1 VIEW COMPARISON:  Radiograph same day FINDINGS: ET tube has been retracted now with at the level of the carina, at the junction of the right mainstem bronchus. NG tube is seen below the level of the diaphragm. The lungs are clear. Cardiomediastinal silhouette is unchanged. IMPRESSION: ET tube has been retracted now seen at the level of the carina just at the junction of the right mainstem bronchus. NG tube below the level of the diaphragm Electronically Signed   By: Prudencio Pair M.D.   On: 05/17/2019 17:04   Disposition   Pt is being discharged home today in good condition.  Follow-up Plans & Appointments    Follow-up Information    Almyra Deforest, [place] Follow up on 06/01/2019.   Specialties: Cardiology, Radiology Why: at 11:30am for your follow up appt.  Contact information: [address] [place] [address] [phone]        Rory Percy, DO Follow up.   Specialty: Family Medicine Why: Please follow up regarding further management of your hypothyroidism.  Contact information: [address] [place] [address] [phone]          Discharge Instructions    Amb Referral to Cardiac Rehabilitation   Complete by: As directed    Diagnosis:  STEMI Coronary Stents     After initial evaluation and assessments completed: Virtual Based Care may be provided alone or in conjunction with Phase 2 Cardiac Rehab based on patient barriers.: Yes   Diet - low sodium heart healthy   Complete by: As directed    Discharge instructions   Complete by: As directed    Radial Site Care Refer to this sheet in the next few weeks. These instructions provide you with information on caring for yourself after your procedure. Your caregiver may also give you more specific instructions. Your treatment has been planned according to current medical practices, but problems sometimes occur. Call your caregiver if you have any problems or questions after your  procedure. HOME CARE INSTRUCTIONS You may shower the day after the procedure.Remove the bandage (dressing) and gently wash the site with plain soap and water.Gently pat the site dry.  Do not apply powder or lotion to the site.  Do not submerge the affected site in water for 3 to 5 days.  Inspect the site at least twice daily.  Do not flex or bend the affected arm for 24 hours.  No lifting over 5 pounds (2.3 kg) for 5 days after your procedure.  Do not drive home if you are discharged the same day of the procedure. Have someone else drive you.  You may drive 24 hours after the procedure unless otherwise instructed by your caregiver.  What to expect: Any bruising will usually fade within 1 to 2 weeks.  Blood that collects in the tissue (  hematoma) may be painful to the touch. It should usually decrease in size and tenderness within 1 to 2 weeks.  SEEK IMMEDIATE MEDICAL CARE IF: You have unusual pain at the radial site.  You have redness, warmth, swelling, or pain at the radial site.  You have drainage (other than a small amount of blood on the dressing).  You have chills.  You have a fever or persistent symptoms for more than 72 hours.  You have a fever and your symptoms suddenly get worse.  Your arm becomes pale, cool, tingly, or numb.  You have heavy bleeding from the site. Hold pressure on the site.   PLEASE DO NOT MISS ANY DOSES OF YOUR BRILINTA!!!!! Also keep a log of you blood pressures and bring back to your follow up appt. Please call the office with any questions.   Patients taking blood thinners should generally stay away from medicines like ibuprofen, Advil, Motrin, naproxen, and Aleve due to risk of stomach bleeding. You may take Tylenol as directed or talk to your primary doctor about alternatives.  No driving for 2 weeks. No lifting over 10 lbs for 4 weeks. No sexual activity for 4 weeks. You may not return to work until cleared by your cardiologist. Keep procedure site clean  & dry. If you notice increased pain, swelling, bleeding or pus, call/return!  You may shower, but no soaking baths/hot tubs/pools for 1 week.   NO DRIVING UNTIL SEEN AT YOUR FOLLOW UP APPT!!!   Increase activity slowly   Complete by: As directed       Discharge Medications     Medication List    STOP taking these medications   hydrochlorothiazide 12.5 MG capsule Commonly known as: MICROZIDE     TAKE these medications   albuterol 108 (90 Base) MCG/ACT inhaler Commonly known as: VENTOLIN HFA Inhale 2 puffs into the lungs every 6 (six) hours as needed for wheezing or shortness of breath.   aspirin EC 81 MG tablet Take 1 tablet (81 mg total) by mouth daily.   atorvastatin 80 MG tablet Commonly known as: LIPITOR Take 1 tablet (80 mg total) by mouth daily at 6 PM. What changed:   medication strength  how much to take  when to take this   levothyroxine 50 MCG tablet Commonly known as: SYNTHROID Take 1 tablet (50 mcg total) by mouth daily before breakfast. Start taking on: May 22, 2019 What changed:   medication strength  how much to take   lisinopril 10 MG tablet Commonly known as: ZESTRIL Take 1 tablet (10 mg total) by mouth daily.   nitroGLYCERIN 0.4 MG SL tablet Commonly known as: NITROSTAT Place 1 tablet (0.4 mg total) under the tongue every 5 (five) minutes x 3 doses as needed for chest pain.   ticagrelor 90 MG Tabs tablet Commonly known as: BRILINTA Take 1 tablet (90 mg total) by mouth 2 (two) times daily.        Acute coronary syndrome (MI, NSTEMI, STEMI, etc) this admission?: Yes.     AHA/ACC Clinical Performance & Quality Measures: 1. Aspirin prescribed? - Yes 2. ADP Receptor Inhibitor (Plavix/Clopidogrel, Brilinta/Ticagrelor or Effient/Prasugrel) prescribed (includes medically managed patients)? - Yes 3. Beta Blocker prescribed? - No - pauses noted on telemetry 4. High Intensity Statin (Lipitor 40-80mg  or Crestor 20-40mg ) prescribed? -  Yes 5. EF assessed during THIS hospitalization? - Yes 6. For EF <40%, was ACEI/ARB prescribed? - Yes 7. For EF <40%, Aldosterone Antagonist (Spironolactone or Eplerenone) prescribed? - No -  Reason:  N/a 8. Cardiac Rehab Phase II ordered (Included Medically managed Patients)? - Yes   Outstanding Labs/Studies   FLP/LFTs in 6 weeks. BMET at follow up appt. Outpatient cardiac monitor.  Duration of Discharge Encounter   Greater than 30 minutes including physician time.  Signed, Reino Bellis NP-C 05/21/2019, 10:44 AM   I have examined the patient and reviewed assessment and plan and discussed with patient.  Agree with above as stated.    GEN: Well nourished, well developed, in no acute distress  HEENT: normal  Neck: no JVD, carotid bruits, or masses Cardiac: RRR; no murmurs, rubs, or gallops,no edema  Respiratory:  clear to auscultation bilaterally, normal work of breathing GI: soft, nontender, nondistended,  MS: no deformity or atrophy ; no right wrist hematoma Skin: warm and dry, no rash Neuro:  Strength and sensation are intact Psych: euthymic mood, full affect  Significant second degree Mobitz II heart block noted on low dose beta blocker.  Improved once the beta blocker was stopped.  Would not discharge on beta blocker.  WIll send home with monitor to make sure the heart block does not recur.   I stressed importance of DAPT and secondary prevention.   Brooke Rivera

## 2019-05-20 NOTE — Progress Notes (Signed)
Pt had another pause, 4 second. Strip looks like 2nd degree heart block, cardiology made aware. Will continue to monitor.

## 2019-05-20 NOTE — TOC Progression Note (Addendum)
Transition of Care [facility]) - Progression Note    Patient Details  Name: Brooke Rivera MRN: [id number] Date of Birth: October 25, 1960  Transition of Care [facility]) CM/SW Contact  Zenon Mayo, RN Phone Number: 05/20/2019, 9:13 AM  Clinical Narrative:    Patient is s/p cath, she will be on brilinta, she has Medicaid which is active co pay is 3.90 per benefit check.  NCM informed patient that [facility] will be filling her first 30 days free for her and bring to her room prior to dc.          Expected Discharge Plan and Services                                                 Social Determinants of Health (SDOH) Interventions    Readmission Risk Interventions No flowsheet data found.

## 2019-05-20 NOTE — Progress Notes (Addendum)
Progress Note  Patient Name: Brooke Rivera Date of Encounter: 05/20/2019  Primary Cardiologist: No primary care provider on file.   Subjective   No chest pain. Slight productive cough.   Inpatient Medications    Scheduled Meds:  aspirin  81 mg Oral Daily   atorvastatin  80 mg Oral q1800   influenza vac split quadrivalent PF  0.5 mL Intramuscular Tomorrow-1000   levothyroxine  50 mcg Per Tube QAC breakfast   lisinopril  10 mg Oral Daily   mouth rinse  15 mL Mouth Rinse BID   metoprolol tartrate  25 mg Oral BID   pneumococcal 23 valent vaccine  0.5 mL Intramuscular Tomorrow-1000   sodium chloride flush  3 mL Intravenous Q12H   sodium chloride flush  3 mL Intravenous Q12H   ticagrelor  90 mg Oral BID   Continuous Infusions:  sodium chloride     sodium chloride     PRN Meds: sodium chloride, sodium chloride, acetaminophen, acetaminophen, albuterol, nitroGLYCERIN, ondansetron (ZOFRAN) IV, sodium chloride flush, sodium chloride flush   Vital Signs    Vitals:   05/19/19 1535 05/19/19 2212 05/20/19 0312 05/20/19 0854  BP: (!) 141/79 134/77 (!) 150/67 (!) 154/83  Pulse: 79 83 81 86  Resp:  18 18   Temp:  98.5 F (36.9 C) 98.8 F (37.1 C)   TempSrc:  Oral Oral   SpO2: 100% 99% 98% 100%  Weight:   57.2 kg   Height:        Intake/Output Summary (Last 24 hours) at 05/20/2019 1022 Last data filed at 05/20/2019 0900 Gross per 24 hour  Intake 1354.04 ml  Output 100 ml  Net 1254.04 ml   Last 3 Weights 05/20/2019 05/19/2019 05/18/2019  Weight (lbs) 126 lb 128 lb 8.5 oz 128 lb 8.5 oz  Weight (kg) 57.153 kg 58.3 kg 58.3 kg      Telemetry    SR - Personally Reviewed  ECG    SR with TWI in inferior leads - Personally Reviewed  Physical Exam  Older AAF GEN: No acute distress.   Neck: No JVD Cardiac: RRR, no murmurs, rubs, or gallops.  Respiratory: Clear to auscultation bilaterally. GI: Soft, nontender, non-distended  MS: No edema; No deformity. Right  radial cath site stable.  Neuro:  Nonfocal  Psych: Normal affect   Labs    High Sensitivity Troponin:   Recent Labs  Lab 05/17/19 1616 05/17/19 1921 05/17/19 2218 05/18/19 0112  TROPONINIHS 154* 9,016* >27,000* >27,000*      Chemistry Recent Labs  Lab 05/18/19 0112 05/19/19 0511 05/20/19 0326  NA 137 137 138  K 3.3* 3.5 3.5  CL 104 103 104  CO2 20* 23 23  GLUCOSE 126* 93 100*  BUN 9 6 <5*  CREATININE 0.77 0.80 0.86  CALCIUM 8.4* 8.3* 8.6*  PROT 6.1*  --   --   ALBUMIN 3.3*  --   --   AST 179*  --   --   ALT 81*  --   --   ALKPHOS 44  --   --   BILITOT 0.7  --   --   GFRNONAA >60 >60 >60  GFRAA >60 >60 >60  ANIONGAP 13 11 11      Hematology Recent Labs  Lab 05/17/19 1616  05/17/19 2026 05/18/19 0112 05/20/19 0326  WBC 8.3  --   --  12.4* 10.2  RBC 5.16*  --   --  4.49 4.29  HGB 12.9   < > 12.2  11.0* 10.6*  HCT 41.2   < > 36.0 35.0* 33.6*  MCV 79.8*  --   --  78.0* 78.3*  MCH 25.0*  --   --  24.5* 24.7*  MCHC 31.3  --   --  31.4 31.5  RDW 15.3  --   --  15.3 15.0  PLT 324  --   --  357 313   < > = values in this interval not displayed.    BNPNo results for input(s): BNP, PROBNP in the last 168 hours.   DDimer No results for input(s): DDIMER in the last 168 hours.   Radiology    No results found.  Cardiac Studies   Cath: 05/17/19   Prox RCA lesion is 100% stenosed.  Mid RCA lesion is 55% stenosed.  Prox Cx lesion is 90% stenosed.  Prox LAD to Mid LAD lesion is 30% stenosed.  Post intervention, there is a 0% residual stenosis.  LV end diastolic pressure is mildly elevated.  A stent was successfully placed.  Acute inferior ST segment elevation myocardial infarction complicated by VF cardiac arrest in the emergency room requiring CPR, defibrillation, and epinephrine with resultant development of atrial fibrillation.  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  Left  circumflex vessel has a 90 to 95% eccentric stenosis proximally with TIMI-3 flow.  Total acute occlusion of the proximal RCA with initial TIMI 0 flow.  Successful PCI to the RCA proximal occlusion with ultimate insertion of a 2.25 x 20 mm Synergy DES stent postdilated to 2.5 mm with 100% occlusion being reduced to 0%. There is a 55 to 60% area of diffuse irregularity proximal to and extending into the acute margin with TIMI-3 flow which was not intervened upon.  Transient hypotension probably contributed by RV infarction syndrome which responded to IV fluids and short course of levophed..  Successful cardioversion back to sinus rhythm with reperfusion and following amiodarone therapy.  RECOMMENDATION: DAPT for minimum of 1 year. Once patient is in the CCU, Brilinta will be administered and cangrelor can be discontinued. We will continue bivalirudin until completion of her current second bag. Plan staged PCI to the proximal left circumflex vessel following stabilization. [place] lipid-lowering therapy with high potency statin treatment with target LDL less than 70. Will obtain 2D echo Doppler study to reassess RV and LV function following revascularization. Avoidance of future cocaine.  Diagnostic Dominance: Right  Cath: 05/19/19   LESION COMPLEX #1: Prox Cx lesion is 90% stenosed - Prox Cx to Mid Cx lesion is 60% stenosed crosses OM1  A drug-eluting stent was successfully placed covering both lesions (crossing OM1) using a STENT SYNERGY DES 2.25X28 -> postdilated with stent balloon to 2.45 mm  Post intervention, there is a 0% residual stenosis.  -------------------------------------------  LESION #2: Ramus lesion is 80% stenosed.  A drug-eluting stent was successfully placed using a STENT SYNERGY DES 2.25X12 -> postdilated to 2.55 mm  Post intervention, there is a 0% residual stenosis.  -------------------------------------  LV end diastolic pressure is moderately  elevated.  SUMMARY  Successful two-vessel PCI: Proximal-mid LCx (Synergy DES 2.25 mm x 28 mm - 2.45 mm), ostial RI (Synergy DES 2.25 mm x 12 mm - 2.55 mm)  Continues to have moderately elevated LVEDP of 21 mm.  RECOMMENDATIONS  Transfer to 6 Central post procedure unit after TR band removal.  Continue aggressive risk modification  Further plans per primary service  Glenetta Hew, MD  Diagnostic Dominance: Right  Intervention   TTE: 05/18/19  IMPRESSIONS   1. Left ventricular ejection fraction, by visual estimation, is 45 to 50%. The left ventricle has normal function. Normal left ventricular size. There is no left ventricular hypertrophy. 2. Basal and mid inferior wall is abnormal. 3. Elevated mean left atrial pressure. 4. Left ventricular diastolic Doppler parameters are consistent with impaired relaxation pattern of LV diastolic filling. 5. Global right ventricle has normal systolic function.The right ventricular size is mildly enlarged. No increase in right ventricular wall thickness. 6. Left atrial size was severely dilated. 7. Right atrial size was normal. 8. The mitral valve is normal in structure. Trace mitral valve regurgitation. No evidence of mitral stenosis. 9. The tricuspid valve is normal in structure. Tricuspid valve regurgitation is mild. 10. The aortic valve is normal in structure. Aortic valve regurgitation was not visualized by color flow Doppler. Structurally normal aortic valve, with no evidence of sclerosis or stenosis. 11. The pulmonic valve was normal in structure. Pulmonic valve regurgitation is not visualized by color flow Doppler. 12. Mildly elevated pulmonary artery systolic pressure. 13. The inferior vena cava is normal in size with <50% respiratory variability, suggesting right atrial pressure of 8 mmHg.  FINDINGS Left Ventricle: Left ventricular ejection fraction, by visual estimation, is 45 to 50%. The left ventricle has  normal function. There is no left ventricular hypertrophy. Normal left ventricular size. Spectral Doppler shows Left ventricular diastolic  Doppler parameters are consistent with impaired relaxation pattern of LV diastolic filling. Elevated mean left atrial pressure.   Patient Profile     58 y.o. female with past medical history of HTN, HLD, and Hypothyroidism who presented to [facility] ED on 05/17/2019 for evaluation of chest pain and found to have a STEMI.   Assessment & Plan    1. STEMI: HsT >27000. Cath 9/21 with PCI/DES x1 to RCA. Placed on DAPT with ASA/Brilinta. Underwent staged intervention to Lcx/RI 9/22 with DES x2. No chest pain overnight. On medical therapy with ASA, statin, BB. Will add ACEi today. Cardiac rehab following.   2. Cardiac Arrest:Ventricular Fibrillation arrest while in the ED requiring 1-2 minutes of CPR, Epi x1 and shock. Extubated on 9/21.   3. HLD: LDL 90. On high dose statin  4. ICM: EF noted at 40-45% with basal and mid-inferior WMA. On BB and adding ACEi today.  5. Hypothyroidism: TSH 5.527, on synthroid 50 mcg PTA. Will need to follow up PCP regarding further management.   For questions or updates, please contact [place] Please consult [email or web address] for contact info under     Signed, Reino Bellis, NP  05/20/2019, 10:22 AM    I have examined the patient and reviewed assessment and plan and discussed with patient.  Agree with above as stated.  Did well walking.  Feels weak, after having been in bed for the past 3 days.  Had some blood while urinating and wiping.  Has had hemmorhoids in the past as well.  If CBC and U/A are ok and she feels well, could discharge later today.  Cardiac arrest in the setting of MI.  Continue DAPT along with aggressive secondary prevention.   Larae Grooms

## 2019-05-20 NOTE — Progress Notes (Signed)
Occupational Therapy Treatment Patient Details Name: Brooke Rivera MRN: [id number] DOB: 02/26/1961 Today's Date: 05/20/2019    History of present illness Brooke Rivera is a 58 y.o. female with past medical history of HTN, HLD, and Hypothyroidism who presented to [facility] ED on 05/17/2019 for evaluation of chest pain and found to have a STEMI. S/p coronary stent intervention 05/19/19.   OT comments  Patient supine in bed and agreeable to OT.  Visibly fatigued during session.  Able to complete transfers with modified independence, tub transfers simulated with supervision, LB dressing and toileting with supervision.  Educated on energy conservation techniques, patient verbalized understanding but noted limited engagement during education.  Will follow acutely.    Follow Up Recommendations  No OT follow up;Supervision - Intermittent    Equipment Recommendations  3 in 1 bedside commode    Recommendations for Other Services      Precautions / Restrictions Precautions Precautions: Fall Restrictions Weight Bearing Restrictions: No       Mobility Bed Mobility Overal bed mobility: Modified Independent                Transfers Overall transfer level: Modified independent Equipment used: None                  Balance Overall balance assessment: Mild deficits observed, not formally tested                                         ADL either performed or assessed with clinical judgement   ADL Overall ADL's : Needs assistance/impaired                     Lower Body Dressing: Supervision/safety;Sit to/from stand   Toilet Transfer: Modified Independent;Ambulation;Regular Toilet   Toileting- Clothing Manipulation and Hygiene: Modified independent;Sit to/from stand   Tub/ Shower Transfer: Tub transfer;Supervision/safety;Ambulation;3 in 1 Tub/Shower Transfer Details (indicate cue type and reason): reviewed safety with UE support stepping over  threshold, using 3:1 for [place]/EC  Functional mobility during ADLs: Supervision/safety General ADL Comments: pt progressing well.  limited by fatigue, educated on energy conservation techniques and home safety, pt with minimal engagement in education of EC strategies     Vision       Perception     Praxis      Cognition Arousal/Alertness: Awake/alert Behavior During Therapy: WFL for tasks assessed/performed Overall Cognitive Status: Within Functional Limits for tasks assessed                                          Exercises     Shoulder Instructions       General Comments VSS    Pertinent Vitals/ Pain       Pain Assessment: No/denies pain  Home Living                                          Prior Functioning/Environment              Frequency  Min 2X/week        Progress Toward Goals  OT Goals(current goals can now be found in the care plan section)  Progress towards OT goals: Progressing toward  goals  Acute Rehab OT Goals Patient Stated Goal: home soon OT Goal Formulation: With patient  Plan Discharge plan remains appropriate;Frequency remains appropriate    Co-evaluation                 AM-PAC OT "6 Clicks" Daily Activity     Outcome Measure   Help from another person eating meals?: None Help from another person taking care of personal grooming?: None Help from another person toileting, which includes using toliet, bedpan, or urinal?: None Help from another person bathing (including washing, rinsing, drying)?: None Help from another person to put on and taking off regular upper body clothing?: None Help from another person to put on and taking off regular lower body clothing?: None 6 Click Score: 24    End of Session    OT Visit Diagnosis: Other abnormalities of gait and mobility (R26.89);Other (comment)(decreased activity tolerance)   Activity Tolerance Patient limited by fatigue   Patient Left  in bed;with call bell/phone within reach   Nurse Communication Mobility status        Time: [id number] OT Time Calculation (min): 12 min  Charges: OT General Charges $OT Visit: 1 Visit OT Treatments $Self Care/Home Management : 8-22 mins  Delight Stare, [place] Pager [phone] Office [phone]    Delight Stare 05/20/2019, 5:00 PM

## 2019-05-20 NOTE — Progress Notes (Signed)
Received call from [place], pt just had 2 second pause and then 3.3 second pause. Pt napping, asymtomatic, unware of these pauses. Cardiology made aware. Will continue to monitor closely.

## 2019-05-21 ENCOUNTER — Other Ambulatory Visit: Payer: Self-pay | Admitting: Cardiology

## 2019-05-21 ENCOUNTER — Encounter (HOSPITAL_COMMUNITY): Payer: Self-pay | Admitting: Cardiology

## 2019-05-21 DIAGNOSIS — I493 Ventricular premature depolarization: Secondary | ICD-10-CM

## 2019-05-21 DIAGNOSIS — Z955 Presence of coronary angioplasty implant and graft: Secondary | ICD-10-CM

## 2019-05-21 DIAGNOSIS — I5021 Acute systolic (congestive) heart failure: Secondary | ICD-10-CM

## 2019-05-21 HISTORY — DX: Acute systolic (congestive) heart failure: I50.21

## 2019-05-21 LAB — BASIC METABOLIC PANEL
Anion gap: 12 (ref 5–15)
BUN: 5 mg/dL — ABNORMAL LOW (ref 6–20)
CO2: 22 mmol/L (ref 22–32)
Calcium: 9.1 mg/dL (ref 8.9–10.3)
Chloride: 103 mmol/L (ref 98–111)
Creatinine, Ser: 0.9 mg/dL (ref 0.44–1.00)
GFR calc Af Amer: >60 mL/min (ref >60)
GFR calc non Af Amer: >60 mL/min (ref >60)
Glucose, Bld: 86 mg/dL (ref 70–99)
Potassium: 3.5 mmol/L (ref 3.5–5.1)
Sodium: 137 mmol/L (ref 135–145)

## 2019-05-21 MED ORDER — NITROGLYCERIN 0.4 MG SL SUBL: 0.4000 mg | SUBLINGUAL_TABLET | SUBLINGUAL | 2 refills | Status: AC | PRN

## 2019-05-21 MED ORDER — LISINOPRIL 10 MG PO TABS: 10.0000 mg | ORAL_TABLET | Freq: Every day | ORAL | 0 refills | Status: DC

## 2019-05-21 MED ORDER — ATORVASTATIN CALCIUM 80 MG PO TABS: 80.0000 mg | ORAL_TABLET | Freq: Every day | ORAL | 1 refills | Status: DC

## 2019-05-21 MED ORDER — LEVOTHYROXINE SODIUM 50 MCG PO TABS: 50.0000 ug | ORAL_TABLET | Freq: Every day | ORAL | 0 refills | Status: DC

## 2019-05-21 MED ORDER — TICAGRELOR 90 MG PO TABS: 90.0000 mg | ORAL_TABLET | Freq: Two times a day (BID) | ORAL | 2 refills | Status: DC

## 2019-05-21 MED FILL — LISINOPRIL 10 MG TABS: 10 | 30 days supply | Qty: 30 | Fill #0

## 2019-05-21 MED FILL — LEVOTHYROXINE 50 MCG TABLET: 50 | 30 days supply | Qty: 30 | Fill #0

## 2019-05-21 MED FILL — NITROGLYCERIN 0.4 MG TAB SL: 0.4 | 8 days supply | Qty: 25 | Fill #0

## 2019-05-21 MED FILL — BRILINTA 90 MG TABLET: 90 | 30 days supply | Qty: 60 | Fill #0

## 2019-05-21 MED FILL — ATORVASTATIN CALCIUM 80 MG: 80 | 90 days supply | Qty: 90 | Fill #0

## 2019-05-21 NOTE — Plan of Care (Signed)
Pt discharged to home. Left facility via private vehicle. Discharge education complete. Pt verbalized understanding.

## 2019-05-22 ENCOUNTER — Telehealth (HOSPITAL_COMMUNITY): Payer: Self-pay

## 2019-05-22 NOTE — Telephone Encounter (Signed)
Called patient to see if she is interested in the Cardiac Rehab Program. Patient expressed interest. Explained scheduling process and went over insurance, patient verbalized understanding. Will contact patient for scheduling once f/u has been completed. 

## 2019-05-22 NOTE — Telephone Encounter (Signed)
Pt insurance is active and benefits verified through Medicare A/B. Co-pay $0.00, DED $0.00/$0.00 met, out of pocket $0.00/$0.00 met, co-insurance 0%. No pre-authorization required. Passport, 05/22/2019 @ 9:13AM, KNZ#[id number]  Pt insurance is active through Medicaid. PND#[id number]  Will fax over Medicaid Reimbursement form to Dr. Claiborne Billings  Will contact patient to see if she is interested in the Cardiac Rehab Program. If interested, patient will need to complete follow up appt. Once completed, patient will be contacted for scheduling upon review by the RN Navigator.

## 2019-05-25 ENCOUNTER — Telehealth: Payer: Self-pay | Admitting: *Deleted

## 2019-05-25 NOTE — Telephone Encounter (Signed)
Patient scheduled to come in Thursday, 05/28/2019, at 1:0d PM , to have her cardiac event monitor applied.  Patient will be accompanied by her daughter, Kentleigh Quirindongo, due to cognitive impairment.

## 2019-05-28 ENCOUNTER — Other Ambulatory Visit: Payer: Self-pay

## 2019-05-28 ENCOUNTER — Ambulatory Visit (INDEPENDENT_AMBULATORY_CARE_PROVIDER_SITE_OTHER): Payer: Medicare Other

## 2019-05-28 ENCOUNTER — Encounter (HOSPITAL_COMMUNITY): Payer: Self-pay | Admitting: Cardiovascular Disease

## 2019-05-28 DIAGNOSIS — I493 Ventricular premature depolarization: Secondary | ICD-10-CM | POA: Diagnosis not present

## 2019-06-01 ENCOUNTER — Encounter: Payer: Self-pay | Admitting: Physician Assistant

## 2019-06-01 ENCOUNTER — Telehealth: Payer: Self-pay | Admitting: Cardiovascular Disease

## 2019-06-01 ENCOUNTER — Ambulatory Visit (INDEPENDENT_AMBULATORY_CARE_PROVIDER_SITE_OTHER): Payer: Medicare Other | Admitting: Physician Assistant

## 2019-06-01 ENCOUNTER — Other Ambulatory Visit: Payer: Self-pay

## 2019-06-01 VITALS — BP 156/80 | HR 58 | Temp 97.3°F | Ht 61.0 in | Wt 127.6 lb

## 2019-06-01 DIAGNOSIS — I455 Other specified heart block: Secondary | ICD-10-CM

## 2019-06-01 DIAGNOSIS — I2510 Atherosclerotic heart disease of native coronary artery without angina pectoris: Secondary | ICD-10-CM | POA: Diagnosis not present

## 2019-06-01 DIAGNOSIS — I255 Ischemic cardiomyopathy: Secondary | ICD-10-CM

## 2019-06-01 DIAGNOSIS — E039 Hypothyroidism, unspecified: Secondary | ICD-10-CM

## 2019-06-01 DIAGNOSIS — E782 Mixed hyperlipidemia: Secondary | ICD-10-CM | POA: Diagnosis not present

## 2019-06-01 DIAGNOSIS — I10 Essential (primary) hypertension: Secondary | ICD-10-CM

## 2019-06-01 DIAGNOSIS — I441 Atrioventricular block, second degree: Secondary | ICD-10-CM | POA: Diagnosis not present

## 2019-06-01 DIAGNOSIS — I4901 Ventricular fibrillation: Secondary | ICD-10-CM

## 2019-06-01 MED ORDER — LISINOPRIL 20 MG PO TABS: 20.0000 mg | ORAL_TABLET | Freq: Every day | ORAL | 1 refills | Status: DC

## 2019-06-01 NOTE — Progress Notes (Signed)
Cardiology Office Note    Date:  06/02/2019   ID:  Brooke Rivera, DOB 06-08-1961, MRN [id number]  PCP:  Rory Percy, DO  Cardiologist:  Dr. Claiborne Billings   Chief Complaint  Patient presents with   Hospitalization Follow-up    seen for Dr. Claiborne Billings. Post PCI    History of Present Illness:  Brooke Rivera is a 58 y.o. female with PMH of HTN, HLD, hypothyroidism and substance abuse who recently presented to the hospital on 05/17/2019 was chest pain and ruled in for STEMI.  She has used cocaine the day prior.  EKG showed ST elevation in the inferior and anterior leads with reciprocal ST depression along the lateral leads.  While in the ED, she had V. fib arrest and underwent CPR.  She received 1 shock and 1 dose of epinephrine.  Return rhythm was in atrial fibrillation.  She was subsequently intubated.  Emergent cardiac catheterization performed on 05/17/2019 showed 100% proximal RCA, 55% mid RCA, 90% proximal left circumflex artery, 30% proximal to mid LAD.  She underwent successful 2.25 x 20 mm Synergy DES placement to RCA reducing 100% occlusion down to 0%.  Staged intervention to proximal left circumflex was recommended following stabilization.  Postprocedure, she successfully converted back to sinus rhythm with amiodarone therapy.  She had transient hypotension in the Cath Lab contributed to RV infarction syndrome and was treated with IV fluid and a short course of Levophed.  Postprocedure, patient was placed on aspirin and Brilinta with recommendation to continue for minimum of 1 year without interruption.  Echocardiogram obtained on 05/18/2019 showed EF 45 to 50% mildly elevated pulmonary artery systolic pressure.  She returned to the Cath Lab on 05/19/2019 and underwent 2.25 x 28 mm Synergy DES to proximal and mid left circumflex artery.  During the hospitalization, she also had a second-degree type II AV block while receiving metoprolol.  The longest pause was around 4 seconds.  Beta-blocker was  stopped and outpatient cardiac heart monitor was recommended.  Patient presents today for cardiology office visit.  She denies any chest pain.  Her blood pressure is elevated today, I plan to increase lisinopril to 20 mg daily.  We discussed the importance of her finding out the price of Brilinta early.  If it is too costly for her, we will initiate medication assistance program will consider switching her from Brilinta to Plavix.  She will need a 18-month fasting lipid panel and LFT.  I plan to see her back in 3 weeks.  Recent heart monitor that was placed last Friday demonstrated at least 2 episodes of sinus pause lasting up to 5.8 seconds.  She was asymptomatic she was asleep at the time in the morning.  She does have snoring issues in the past, however denies any previously known obstructive sleep apnea or previous sleep study.  I discussed the case with DOD Dr. Audie Box, I plan to refer the patient to electrophysiology service for further evaluation of sinus pause.  She may ended up needing a pacemaker in the future.    Past Medical History:  Diagnosis Date   Allergy    mild    Cocaine use    Depression    rx not taking at this time   Goiter, lymphadenoid    thyroid   Hx of adenomatous polyp of colon 05/26/2017   Hyperlipidemia    Hypertension    STEMI (ST elevation myocardial infarction) ([place])    Systolic heart failure [facility])     Past Surgical  History:  Procedure Laterality Date   COLONOSCOPY  04/2017   1 adenoma   CORONARY STENT INTERVENTION N/A 05/19/2019   Procedure: CORONARY STENT INTERVENTION;  Surgeon: Leonie Man, MD;  Location: [place] [facility];  Service: Cardiovascular;  Laterality: N/A;   CORONARY/GRAFT ACUTE MI REVASCULARIZATION N/A 05/17/2019   Procedure: Coronary/Graft Acute MI Revascularization;  Surgeon: Troy Sine, MD;  Location: [place] [facility];  Service: Cardiovascular;  Laterality: N/A;   LEFT HEART CATH AND CORONARY ANGIOGRAPHY N/A 05/19/2019    Procedure: LEFT HEART CATH AND CORONARY ANGIOGRAPHY;  Surgeon: Leonie Man, MD;  Location: [place] [facility];  Service: Cardiovascular;  Laterality: N/A;   LEFT HEART CATH AND CORONARY ANGIOGRAPHY N/A 05/17/2019   Procedure: LEFT HEART CATH AND CORONARY ANGIOGRAPHY;  Surgeon: Troy Sine, MD;  Location: [place] [facility];  Service: Cardiovascular;  Laterality: N/A;   THYROIDECTOMY Bilateral 07/08/2015   Procedure: BILATERAL THYROIDECTOMY;  Surgeon: Ruby Cola, MD;  Location: [facility];  Service: ENT;  Laterality: Bilateral;    Current Medications: Outpatient Medications Prior to Visit  Medication Sig Dispense Refill   albuterol (PROVENTIL HFA;VENTOLIN HFA) 108 (90 Base) MCG/ACT inhaler Inhale 2 puffs into the lungs every 6 (six) hours as needed for wheezing or shortness of breath. 1 Inhaler 0   aspirin EC 81 MG tablet Take 1 tablet (81 mg total) by mouth daily. 30 tablet 0   atorvastatin (LIPITOR) 80 MG tablet Take 1 tablet (80 mg total) by mouth daily at 6 PM. 90 tablet 1   levothyroxine (SYNTHROID) 50 MCG tablet Take 1 tablet (50 mcg total) by mouth daily before breakfast. 30 tablet 0   nitroGLYCERIN (NITROSTAT) 0.4 MG SL tablet Place 1 tablet (0.4 mg total) under the tongue every 5 (five) minutes x 3 doses as needed for chest pain. 25 tablet 2   ticagrelor (BRILINTA) 90 MG TABS tablet Take 1 tablet (90 mg total) by mouth 2 (two) times daily. 180 tablet 2   lisinopril (ZESTRIL) 10 MG tablet Take 1 tablet (10 mg total) by mouth daily. 30 tablet 0   No facility-administered medications prior to visit.      Allergies:   Cabbage   Social History   Socioeconomic History   Marital status: Single    Spouse name: Not on file   Number of children: Not on file   Years of education: Not on file   Highest education level: Not on file  Occupational History   Not on file  Social Needs   Financial resource strain: Not on file   Food insecurity    Worry: Not on file      Inability: Not on file   Transportation needs    Medical: Not on file    Non-medical: Not on file  Tobacco Use   Smoking status: Current Every Day Smoker    Packs/day: 0.25    Types: Cigarettes   Smokeless tobacco: Never Used   Tobacco comment: 4-5 cigs a day   Substance and Sexual Activity   Alcohol use: Yes    Alcohol/week: 0.0 standard drinks    Comment: last occurence 3 weeks ago   Drug use: No   Sexual activity: Not on file  Lifestyle   Physical activity    Days per week: Not on file    Minutes per session: Not on file   Stress: Not on file  Relationships   Social connections    Talks on phone: Not on file  Gets together: Not on file    Attends religious service: Not on file    Active member of club or organization: Not on file    Attends meetings of clubs or organizations: Not on file    Relationship status: Not on file  Other Topics Concern   Not on file  Social History Narrative   Not on file     Family History:  The patient's family history includes Cancer in her father and mother; Hyperlipidemia in her mother.   ROS:   Please see the history of present illness.    ROS All other systems reviewed and are negative.   PHYSICAL EXAM:   VS:  BP (!) 156/80    Pulse (!) 58    Temp (!) 97.3 F (36.3 C)    Ht 5\' 1"  (1.549 m)    Wt 127 lb 9.6 oz (57.9 kg)    SpO2 97%    BMI 24.11 kg/m    GEN: Well nourished, well developed, in no acute distress  HEENT: normal  Neck: no JVD, carotid bruits, or masses Cardiac: RRR; no murmurs, rubs, or gallops,no edema  Respiratory:  clear to auscultation bilaterally, normal work of breathing GI: soft, nontender, nondistended, + BS MS: no deformity or atrophy  Skin: warm and dry, no rash Neuro:  Alert and Oriented x 3, Strength and sensation are intact Psych: euthymic mood, full affect  Wt Readings from Last 3 Encounters:  06/01/19 127 lb 9.6 oz (57.9 kg)  05/21/19 122 lb 3.2 oz (55.4 kg)  04/02/19 144 lb 9.6  oz (65.6 kg)      Studies/Labs Reviewed:   EKG:  EKG is ordered today.  The ekg ordered today demonstrates normal sinus rhythm, T wave inversion in the inferior leads, and poor R wave progression in anterior leads.  Recent Labs: 04/02/2019: BNP 28.2 05/18/2019: ALT 81; TSH 5.527 05/20/2019: Hemoglobin 10.6; Platelets 313 05/21/2019: BUN 5; Creatinine, Ser 0.90; Potassium 3.5; Sodium 137   Lipid Panel    Component Value Date/Time   CHOL 153 05/18/2019 0112   CHOL 266 (H) 05/02/2017 1409   TRIG 70 05/18/2019 0112   TRIG 72 05/18/2019 0112   HDL 49 05/18/2019 0112   HDL 60 05/02/2017 1409   CHOLHDL 3.1 05/18/2019 0112   VLDL 14 05/18/2019 0112   LDLCALC 90 05/18/2019 0112   LDLCALC 159 (H) 05/02/2017 1409   LDLDIRECT 119 (H) 08/08/2012 1131    Additional studies/ records that were reviewed today include:   Cath 05/17/2019   Prox RCA lesion is 100% stenosed.  Mid RCA lesion is 55% stenosed.  Prox Cx lesion is 90% stenosed.  Prox LAD to Mid LAD lesion is 30% stenosed.  Post intervention, there is a 0% residual stenosis.  LV end diastolic pressure is mildly elevated.  A stent was successfully placed.   Acute inferior ST segment elevation myocardial infarction complicated by VF cardiac arrest in the emergency room requiring CPR, defibrillation, and epinephrine with resultant development of atrial fibrillation.  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  Left circumflex vessel has a 90 to 95% eccentric stenosis proximally with TIMI-3 flow.  Total acute occlusion of the proximal RCA with initial TIMI 0 flow.  Successful PCI to the RCA proximal occlusion with ultimate insertion of a 2.25 x 20 mm Synergy DES stent postdilated to 2.5 mm with 100% occlusion being reduced to 0%.  There is a 55 to  60% area of diffuse irregularity proximal to and extending into the acute margin with TIMI-3 flow which was not  intervened upon.  Transient hypotension probably contributed by RV infarction syndrome which responded to IV fluids and short course of levophed..  Successful  cardioversion back to sinus rhythm with reperfusion and following amiodarone therapy.  RECOMMENDATION: DAPT for minimum of 1 year.  Once patient is in the CCU, Brilinta will be administered and cangrelor can be discontinued.  We will continue bivalirudin until completion of her current second bag.  Plan staged PCI to the proximal left circumflex vessel following stabilization.  [place] lipid-lowering therapy with high potency statin treatment with target LDL less than 70.  Will obtain 2D echo Doppler study to reassess RV and LV function following revascularization.  Avoidance of future cocaine.  Echo 05/18/2019 1. Left ventricular ejection fraction, by visual estimation, is 45 to 50%. The left ventricle has normal function. Normal left ventricular size. There is no left ventricular hypertrophy.  2. Basal and mid inferior wall is abnormal.  3. Elevated mean left atrial pressure.  4. Left ventricular diastolic Doppler parameters are consistent with impaired relaxation pattern of LV diastolic filling.  5. Global right ventricle has normal systolic function.The right ventricular size is mildly enlarged. No increase in right ventricular wall thickness.  6. Left atrial size was severely dilated.  7. Right atrial size was normal.  8. The mitral valve is normal in structure. Trace mitral valve regurgitation. No evidence of mitral stenosis.  9. The tricuspid valve is normal in structure. Tricuspid valve regurgitation is mild. 10. The aortic valve is normal in structure. Aortic valve regurgitation was not visualized by color flow Doppler. Structurally normal aortic valve, with no evidence of sclerosis or stenosis. 11. The pulmonic valve was normal in structure. Pulmonic valve regurgitation is not visualized by color flow Doppler. 12. Mildly  elevated pulmonary artery systolic pressure. 13. The inferior vena cava is normal in size with <50% respiratory variability, suggesting right atrial pressure of 8 mmHg.   Cath 05/19/2019  LESION COMPLEX #1: Prox Cx lesion is 90% stenosed - Prox Cx to Mid Cx lesion is 60% stenosed crosses OM1  A drug-eluting stent was successfully placed covering both lesions (crossing OM1) using a STENT SYNERGY DES 2.25X28 -> postdilated with stent balloon to 2.45 mm  Post intervention, there is a 0% residual stenosis.  -------------------------------------------  LESION #2: Ramus lesion is 80% stenosed.  A drug-eluting stent was successfully placed using a STENT SYNERGY DES 2.25X12 -> postdilated to 2.55 mm  Post intervention, there is a 0% residual stenosis.  -------------------------------------  LV end diastolic pressure is moderately elevated.   SUMMARY  Successful two-vessel PCI: Proximal-mid LCx (Synergy DES 2.25 mm x 28 mm - 2.45 mm), ostial RI (Synergy DES 2.25 mm x 12 mm - 2.55 mm)  Continues to have moderately elevated LVEDP of 21 mm.  RECOMMENDATIONS  Transfer to 6 Central post procedure unit after TR band removal.  Continue aggressive risk modification  Further plans per primary service    ASSESSMENT:    1. Coronary artery disease involving native coronary artery of native heart without angina pectoris   2. Ventricular fibrillation (HCC)   3. 2nd degree AV block   4. Hypertension, benign   5. Mixed hyperlipidemia   6. Hypothyroidism, unspecified type   7. Ischemic cardiomyopathy   8. Sinus pause      PLAN:  In order of problems listed above:  1. Coronary artery disease: Recently underwent  stent placement to RCA, ramus, and left circumflex artery.  Continue aspirin and Brilinta.  Emphasis has been placed on compliance with dual antiplatelet therapy.  She is to contact cardiology as soon as she find out the price of Brilinta especially if it is too  expensive.  2. Ventricular fibrillation: Occurred prior to cardiac catheterization.  Has not had any dizziness since  3. Sinus pause: Patient had second-degree type II AV block occurred in the hospital after metoprolol, beta-blocker was discontinued at the time.  Since then, she has had at least 2 episodes of significant AV conduction failure on the heart monitor.  There was P waves however there was no subsequent QRS and T waves.  This resulted in sinus pauses up to 5.8 seconds. Patient was asleep during both outpatient episodes which occurred around 7:30 AM in the morning.  I discussed the case with DOD Dr. Audie Box, I will refer the patient to our electrophysiologist in case she ends up needing a pacemaker in the future.  At this time, she does not have any symptoms during daytime including dizziness, blurred vision or feeling of passing out.  4. Hypertension: Blood pressure elevated even on repeat manual recheck, increase lisinopril to 20 mg daily  5. Hyperlipidemia: Continue statin therapy.  Obtain fasting lipid panel and LFT in 2 months  6. Hypothyroidism: We will defer management to primary care provider  7. Ischemic cardiomyopathy: Currently on lisinopril, unable to tolerate beta-blocker due to sinus pauses    Medication Adjustments/Labs and Tests Ordered: Current medicines are reviewed at length with the patient today.  Concerns regarding medicines are outlined above.  Medication changes, Labs and Tests ordered today are listed in the Patient Instructions below. Patient Instructions  Medication Instructions:  INCREASE Lisinopril to 20 mg daily If you need a refill on your cardiac medications before your next appointment, please call your pharmacy.   Lab work: You will need to have labs (blood work) drawn in 2 months:  Fasting Lipid Panel  Liver Function Test   If you have labs (blood work) drawn today and your tests are completely normal, you will receive your results only  by:  MyChart Message (if you have MyChart) OR  A paper copy in the mail If you have any lab test that is abnormal or we need to change your treatment, we will call you to review the results.  Testing/Procedures: NONE ordered at this time of appointment   Follow-Up: At [facility] you and your health needs are our priority.  As part of our continuing mission to provide you with exceptional heart care, we have created designated Provider Care Teams.  These Care Teams include your primary Cardiologist (physician) and Advanced Practice Providers (APPs -  Physician Assistants and Nurse Practitioners) who all work together to provide you with the care you need, when you need it. You will need a follow up appointment in 3 weeks with Almyra Deforest, PA-C   Any Other Special Instructions Will Be Listed Below (If Applicable).  You have been referred to Electrophysiology (EP)  To be seen within 2 weeks   Monitor your blood pressure daily      Signed, Almyra Deforest, [place]  06/02/2019 8:47 AM    [facility] [address], [place], [place]  [address] Phone: [phone]; Fax: [phone]

## 2019-06-01 NOTE — Patient Instructions (Addendum)
Medication Instructions:  INCREASE Lisinopril to 20 mg daily If you need a refill on your cardiac medications before your next appointment, please call your pharmacy.   Lab work: You will need to have labs (blood work) drawn in 2 months:  Fasting Lipid Panel  Liver Function Test   If you have labs (blood work) drawn today and your tests are completely normal, you will receive your results only by: Marland Kitchen MyChart Message (if you have MyChart) OR . A paper copy in the mail If you have any lab test that is abnormal or we need to change your treatment, we will call you to review the results.  Testing/Procedures: NONE ordered at this time of appointment   Follow-Up: At [place], you and your health needs are our priority.  As part of our continuing mission to provide you with exceptional heart care, we have created designated Provider Care Teams.  These Care Teams include your primary Cardiologist (physician) and Advanced Practice Providers (APPs -  Physician Assistants and Nurse Practitioners) who all work together to provide you with the care you need, when you need it. You will need a follow up appointment in 3 weeks with Almyra Deforest, PA-C   Any Other Special Instructions Will Be Listed Below (If Applicable).  You have been referred to Electrophysiology (EP)  To be seen within 2 weeks   Monitor your blood pressure daily

## 2019-06-01 NOTE — Telephone Encounter (Signed)
Monitor reports from 05/30/2019 around 7:20-7:30am received with pauses noted.   7:22am - 5.8 sec pause with 1st degree AVB/vent escape beat 7:37am - 4.7 sec pause with 1st degree AVB  Patient called - she was sleeping at this time. Asymptomatic.   Reviewed by DOD Dr. Audie Box - no changes, continue to monitor.   F/up with Almyra Deforest, PA 10/5 @ 11:30

## 2019-06-02 ENCOUNTER — Encounter: Payer: Self-pay | Admitting: Physician Assistant

## 2019-06-04 NOTE — Progress Notes (Deleted)
ELECTROPHYSIOLOGY CONSULT NOTE  Patient ID: Brooke Rivera, MRN: [id number], DOB/AGE: Mar 28, 1961 58 y.o. Admit date: (Not on file) Date of Consult: 06/04/2019  Primary Physician: Rory Percy, DO Primary Cardiologist: ***     [name] is a 58 y.o. female who is being seen today for the evaluation of *** at the request of ***.    HPI [name] is a 58 y.o. female referred for a nocturnal pause   Past Medical History:  Diagnosis Date  . Allergy    mild   . Cocaine use   . Depression    rx not taking at this time  . Goiter, lymphadenoid    thyroid  . Hx of adenomatous polyp of colon 05/26/2017  . Hyperlipidemia   . Hypertension   . STEMI (ST elevation myocardial infarction) ([place])   . Systolic heart failure [facility])       Surgical History:  Past Surgical History:  Procedure Laterality Date  . COLONOSCOPY  04/2017   1 adenoma  . CORONARY STENT INTERVENTION N/A 05/19/2019   Procedure: CORONARY STENT INTERVENTION;  Surgeon: Leonie Man, MD;  Location: [place] [facility];  Service: Cardiovascular;  Laterality: N/A;  . CORONARY/GRAFT ACUTE MI REVASCULARIZATION N/A 05/17/2019   Procedure: Coronary/Graft Acute MI Revascularization;  Surgeon: Troy Sine, MD;  Location: [place] [facility];  Service: Cardiovascular;  Laterality: N/A;  . LEFT HEART CATH AND CORONARY ANGIOGRAPHY N/A 05/19/2019   Procedure: LEFT HEART CATH AND CORONARY ANGIOGRAPHY;  Surgeon: Leonie Man, MD;  Location: [place] [facility];  Service: Cardiovascular;  Laterality: N/A;  . LEFT HEART CATH AND CORONARY ANGIOGRAPHY N/A 05/17/2019   Procedure: LEFT HEART CATH AND CORONARY ANGIOGRAPHY;  Surgeon: Troy Sine, MD;  Location: [place] [facility];  Service: Cardiovascular;  Laterality: N/A;  . THYROIDECTOMY Bilateral 07/08/2015   Procedure: BILATERAL THYROIDECTOMY;  Surgeon: Ruby Cola, MD;  Location: [facility] OR;  Service: ENT;  Laterality: Bilateral;     Home Meds: No outpatient  medications have been marked as taking for the 06/05/19 encounter (Appointment) with Deboraha Sprang, MD.    Allergies:  Allergies  Allergen Reactions  . Cabbage Itching and Swelling    Facial redness    Social History   Socioeconomic History  . Marital status: Single    Spouse name: Not on file  . Number of children: Not on file  . Years of education: Not on file  . Highest education level: Not on file  Occupational History  . Not on file  Social Needs  . Financial resource strain: Not on file  . Food insecurity    Worry: Not on file    Inability: Not on file  . Transportation needs    Medical: Not on file    Non-medical: Not on file  Tobacco Use  . Smoking status: Current Every Day Smoker    Packs/day: 0.25    Types: Cigarettes  . Smokeless tobacco: Never Used  . Tobacco comment: 4-5 cigs a day   Substance and Sexual Activity  . Alcohol use: Yes    Alcohol/week: 0.0 standard drinks    Comment: last occurence 3 weeks ago  . Drug use: No  . Sexual activity: Not on file  Lifestyle  . Physical activity    Days per week: Not on file    Minutes per session: Not on file  . Stress: Not on file  Relationships  . Social Herbalist on  phone: Not on file    Gets together: Not on file    Attends religious service: Not on file    Active member of club or organization: Not on file    Attends meetings of clubs or organizations: Not on file    Relationship status: Not on file  . Intimate partner violence    Fear of current or ex partner: Not on file    Emotionally abused: Not on file    Physically abused: Not on file    Forced sexual activity: Not on file  Other Topics Concern  . Not on file  Social History Narrative  . Not on file     Family History  Problem Relation Age of Onset  . Hyperlipidemia Mother   . Cancer Mother   . Cancer Father   . Colon cancer Neg Hx   . Colon polyps Neg Hx   . Esophageal cancer Neg Hx   . Rectal cancer Neg Hx   .  Stomach cancer Neg Hx      ROS:  Please see the history of present illness.   {ros [id number]}  All other systems reviewed and negative.    Physical Exam:*** There were no vitals taken for this visit. General: Well developed, well nourished female in no acute distress. Head: Normocephalic, atraumatic, sclera non-icteric, no xanthomas, nares are without discharge. EENT: normal  Lymph Nodes:  none Neck: Negative for carotid bruits. JVD not elevated. Back:without scoliosis kyphosis*** Lungs: Clear bilaterally to auscultation without wheezes, rales, or rhonchi. Breathing is unlabored. Heart: RRR with S1 S2. No *** ***/6 systolic*** murmur . No rubs, or gallops appreciated. Abdomen: Soft, non-tender, non-distended with normoactive bowel sounds. No hepatomegaly. No rebound/guarding. No obvious abdominal masses. Msk:  Strength and tone appear normal for age. Extremities: No clubbing or cyanosis. No*** ***+*** edema.  Distal pedal pulses are 2+ and equal bilaterally. Skin: Warm and Dry Neuro: Alert and oriented X 3. CN III-XII intact Grossly normal sensory and motor function . Psych:  Responds to questions appropriately with a normal affect.      Labs: Cardiac Enzymes No results for input(s): CKTOTAL, CKMB, TROPONINI in the last 72 hours. CBC Lab Results  Component Value Date   WBC 10.2 05/20/2019   HGB 10.6 (L) 05/20/2019   HCT 33.6 (L) 05/20/2019   MCV 78.3 (L) 05/20/2019   PLT 313 05/20/2019   PROTIME: No results for input(s): LABPROT, INR in the last 72 hours. Chemistry No results for input(s): NA, K, CL, CO2, BUN, CREATININE, CALCIUM, PROT, BILITOT, ALKPHOS, ALT, AST, GLUCOSE in the last 168 hours.  Invalid input(s): LABALBU Lipids Lab Results  Component Value Date   CHOL 153 05/18/2019   HDL 49 05/18/2019   LDLCALC 90 05/18/2019   TRIG 70 05/18/2019   TRIG 72 05/18/2019   BNP No results found for: PROBNP Thyroid Function Tests: No results for input(s): TSH,  T4TOTAL, T3FREE, THYROIDAB in the last 72 hours.  Invalid input(s): FREET3 Miscellaneous No results found for: DDIMER  Radiology/Studies:  Dg Chest Port 1 View  Result Date: 05/18/2019 CLINICAL DATA:  Intubation.  Respiratory failure. EXAM: PORTABLE CHEST 1 VIEW COMPARISON:  05/17/2019. FINDINGS: Endotracheal tube and NG tube in stable position. Cardiomegaly. Low lung volumes with bilateral perihilar and left base atelectasis/infiltrates. Small left pleural effusion. No pneumothorax. Thoracic spine scoliosis. IMPRESSION: 1.  Lines and tubes in stable position. 2.  Cardiomegaly. 3. Low lung volumes with bilateral perihilar and left base atelectasis/infiltrates. Small left pleural effusion. Electronically  Signed   By: Marcello Moores  Register   On: 05/18/2019 06:38   Dg Chest Portable 1 View  Result Date: 05/17/2019 CLINICAL DATA:  Intubation EXAM: PORTABLE CHEST 1 VIEW COMPARISON:  07/08/2015 FINDINGS: Endotracheal tube is positioned with tip in the right mainstem bronchus. Esophagogastric tube with tip and side port below the diaphragm. Cardiomegaly. Heterogeneous airspace opacity of the left midlung. IMPRESSION: 1. Endotracheal tube is positioned with tip in the right mainstem bronchus. Tube tip is retracted to appropriate position on subsequent radiographs time stamped 4:52 p.m. 2.  Cardiomegaly. 3. Heterogeneous airspace opacity of the left midlung, concerning for infection or aspiration. Electronically Signed   By: Eddie Candle M.D.   On: 05/17/2019 17:05   Dg Chest Portable 1 View  Result Date: 05/17/2019 CLINICAL DATA:  Intubation EXAM: PORTABLE CHEST 1 VIEW COMPARISON:  None. FINDINGS: The endotracheal tube has been retracted and is now 2 cm above the level of the carina. NG tube is seen below the diaphragm. Cardiomediastinal silhouette is unchanged. The lungs are clear. IMPRESSION: Endotracheal tube tip 2 cm above the carina. NG tube below the level of the diaphragm. Electronically Signed   By:  Prudencio Pair M.D.   On: 05/17/2019 17:05   Dg Chest Portable 1 View  Result Date: 05/17/2019 CLINICAL DATA:  Intubation EXAM: PORTABLE CHEST 1 VIEW COMPARISON:  Radiograph same day FINDINGS: ET tube has been retracted now with at the level of the carina, at the junction of the right mainstem bronchus. NG tube is seen below the level of the diaphragm. The lungs are clear. Cardiomediastinal silhouette is unchanged. IMPRESSION: ET tube has been retracted now seen at the level of the carina just at the junction of the right mainstem bronchus. NG tube below the level of the diaphragm Electronically Signed   By: Prudencio Pair M.D.   On: 05/17/2019 17:04    EKG: ***   Assessment and Plan: *** Virl Axe

## 2019-06-05 ENCOUNTER — Institutional Professional Consult (permissible substitution): Payer: Medicare Other | Admitting: Internal Medicine

## 2019-06-12 ENCOUNTER — Encounter: Payer: Self-pay | Admitting: Internal Medicine

## 2019-06-12 ENCOUNTER — Other Ambulatory Visit: Payer: Self-pay

## 2019-06-12 ENCOUNTER — Telehealth (INDEPENDENT_AMBULATORY_CARE_PROVIDER_SITE_OTHER): Payer: Medicare Other | Admitting: Internal Medicine

## 2019-06-12 VITALS — BP 126/84 | HR 85 | Ht 61.0 in | Wt 126.0 lb

## 2019-06-12 DIAGNOSIS — I2510 Atherosclerotic heart disease of native coronary artery without angina pectoris: Secondary | ICD-10-CM

## 2019-06-12 DIAGNOSIS — I4901 Ventricular fibrillation: Secondary | ICD-10-CM | POA: Diagnosis not present

## 2019-06-12 DIAGNOSIS — I455 Other specified heart block: Secondary | ICD-10-CM | POA: Diagnosis not present

## 2019-06-12 DIAGNOSIS — I441 Atrioventricular block, second degree: Secondary | ICD-10-CM | POA: Diagnosis not present

## 2019-06-12 DIAGNOSIS — I255 Ischemic cardiomyopathy: Secondary | ICD-10-CM | POA: Diagnosis not present

## 2019-06-12 NOTE — Progress Notes (Signed)
Did not answer VM left  Will need to reschedule for another day   Thompson Grayer MD, [facility] 06/12/2019 10:52 AM

## 2019-06-12 NOTE — Progress Notes (Signed)
Electrophysiology TeleHealth Note   Due to national recommendations of social distancing due to St.  19, Audio telehealth visit is felt to be most appropriate for this patient at this time.  Verbal consent was obtained from the patient for the teleheatlh visit today.   Date:  06/12/2019   ID:  Brooke Rivera, DOB 1960-12-17, MRN [id number]  Location: home Provider location: [place] Evaluation Performed: New patient consult  PCP:  Rory Percy, DO  Cardiologist:  Shelva Majestic, MD  Electrophysiologist:  None   Chief Complaint: bradycardia  History of Present Illness:    Brooke Rivera is a 58 y.o. female who presents via audio conferencing for a telehealth visit today.   The patient is referred for new consultation regarding bradycardia by Dr Claiborne Billings.   She recently had acute inferior MI in the setting of cocaine use.  In the ED, she had VF arrest.  She was successfully defibrillated and underwent cath which showed 100% proximal stenosis of the RCA.  She underwent PCI of the RCA and later had staged PCI of the LCx.  In the setting of her inferior MI and amidoarone/ metoprolol, she had transient second degree AV block. She was discharged with a monitor.  This has documented nocturnal pauses of 5 seconds.  She has been completely asymptomatic.  She reports "I feel great".  Denies any symptoms of bradycardia or pauses.  Today, she denies symptoms of palpitations, chest pain, shortness of breath, orthopnea, PND, lower extremity edema, claudication, dizziness, presyncope, syncope, bleeding, or neurologic sequela. The patient is tolerating medications without difficulties and is otherwise without complaint today.   she denies symptoms of cough, fevers, chills, or new SOB worrisome for COVID 19.   Past Medical History:  Diagnosis Date  . Allergy    mild   . Cocaine use   . Depression    rx not taking at this time  . Goiter, lymphadenoid    thyroid  . Hx of adenomatous polyp  of colon 05/26/2017  . Hyperlipidemia   . Hypertension   . STEMI (ST elevation myocardial infarction) ([place])   . Systolic heart failure [facility])     Past Surgical History:  Procedure Laterality Date  . COLONOSCOPY  04/2017   1 adenoma  . CORONARY STENT INTERVENTION N/A 05/19/2019   Procedure: CORONARY STENT INTERVENTION;  Surgeon: Leonie Man, MD;  Location: [place] [facility];  Service: Cardiovascular;  Laterality: N/A;  . CORONARY/GRAFT ACUTE MI REVASCULARIZATION N/A 05/17/2019   Procedure: Coronary/Graft Acute MI Revascularization;  Surgeon: Troy Sine, MD;  Location: [place] [facility];  Service: Cardiovascular;  Laterality: N/A;  . LEFT HEART CATH AND CORONARY ANGIOGRAPHY N/A 05/19/2019   Procedure: LEFT HEART CATH AND CORONARY ANGIOGRAPHY;  Surgeon: Leonie Man, MD;  Location: [place] [facility];  Service: Cardiovascular;  Laterality: N/A;  . LEFT HEART CATH AND CORONARY ANGIOGRAPHY N/A 05/17/2019   Procedure: LEFT HEART CATH AND CORONARY ANGIOGRAPHY;  Surgeon: Troy Sine, MD;  Location: [place] [facility];  Service: Cardiovascular;  Laterality: N/A;  . THYROIDECTOMY Bilateral 07/08/2015   Procedure: BILATERAL THYROIDECTOMY;  Surgeon: Ruby Cola, MD;  Location: [place];  Service: ENT;  Laterality: Bilateral;    Current Outpatient Medications  Medication Sig Dispense Refill  . albuterol (PROVENTIL HFA;VENTOLIN HFA) 108 (90 Base) MCG/ACT inhaler Inhale 2 puffs into the lungs every 6 (six) hours as needed for wheezing or shortness of breath. 1 Inhaler 0  . aspirin EC 81 MG  tablet Take 1 tablet (81 mg total) by mouth daily. 30 tablet 0  . atorvastatin (LIPITOR) 80 MG tablet Take 1 tablet (80 mg total) by mouth daily at 6 PM. 90 tablet 1  . levothyroxine (SYNTHROID) 50 MCG tablet Take 1 tablet (50 mcg total) by mouth daily before breakfast. 30 tablet 0  . lisinopril (ZESTRIL) 20 MG tablet Take 1 tablet (20 mg total) by mouth daily. 90 tablet 1  . nitroGLYCERIN (NITROSTAT)  0.4 MG SL tablet Place 1 tablet (0.4 mg total) under the tongue every 5 (five) minutes x 3 doses as needed for chest pain. 25 tablet 2  . ticagrelor (BRILINTA) 90 MG TABS tablet Take 1 tablet (90 mg total) by mouth 2 (two) times daily. 180 tablet 2   No current facility-administered medications for this visit.     Allergies:   Cabbage   Social History:  The patient  reports that she has been smoking cigarettes. She has been smoking about 0.25 packs per day. She has never used smokeless tobacco. She reports current alcohol use. She reports that she does not use drugs.   Family History:  The patient's family history includes Cancer in her father and mother; Hyperlipidemia in her mother.    ROS:  Please see the history of present illness.   All other systems are personally reviewed and negative.    Exam:    Vital Signs:  BP 126/84   Pulse 85   Ht 5\' 1"  (1.549 m)   Wt 126 lb (57.2 kg)   BMI 23.81 kg/m   Well sounding, alert and conversant   Labs/Other Tests and Data Reviewed:    Recent Labs: 04/02/2019: BNP 28.2 05/18/2019: ALT 81; TSH 5.527 05/20/2019: Hemoglobin 10.6; Platelets 313 05/21/2019: BUN 5; Creatinine, Ser 0.90; Potassium 3.5; Sodium 137   Wt Readings from Last 3 Encounters:  06/12/19 126 lb (57.2 kg)  06/01/19 127 lb 9.6 oz (57.9 kg)  05/21/19 122 lb 3.2 oz (55.4 kg)     Other studies personally reviewed: Additional studies/ records that were reviewed today include: recent hospital records and ekgs are reviewed  [facility] notes are reviewed,  Dr Ermalinda Memos' phone note about the monitor findings are reviewed.     Unfortunately, strips from the patient's monitor are currently not available for me to review  ASSESSMENT & PLAN:    1.  Sinus pauses Per report I do not have strips to review She is still wearing the monitor.  I will try to obtain strips. She reports no symptoms,  Events were nocturnal. She is clear in her decision to avoid PPM. Per guidelines, she  does not have any urgent indication for pacing at this time.  She does not drive. She will contact my office if she has any symptoms of bradycardia (discussed at length with her today). Avoid chronodepressant agents at this time  2. VF arrest Occurred in the setting of cocaine and acute inferior MI No indication for ICD at this time  3. Cocaine cessationadvised  4. ischemiic CM/ CAD Follows with Dr Claiborne Billings  Follow-up with Dr Claiborne Billings as scheduled.  I will see as needed  Current medicines are reviewed at length with the patient today.   The patient does not have concerns regarding her medicines.  The following changes were made today:  none  Labs/ tests ordered today include:  No orders of the defined types were placed in this encounter.   Patient Risk:  after full review of this patients clinical  status, I feel that they are at highrisk at this time.   Today, I have spent 20 minutes with the patient with telehealth technology discussing bradycardia .    Signed, Thompson Grayer MD, [place] 06/12/2019 3:10 PM   [address] [address] [place] [address] [phone] (office) [phone] (fax)

## 2019-06-12 NOTE — Addendum Note (Signed)
Addended by: Thompson Grayer on: 06/12/2019 03:38 PM   Modules accepted: Level of Service

## 2019-06-24 ENCOUNTER — Encounter (HOSPITAL_COMMUNITY): Payer: Self-pay

## 2019-06-24 ENCOUNTER — Other Ambulatory Visit: Payer: Self-pay | Admitting: Family Medicine

## 2019-06-24 ENCOUNTER — Telehealth: Payer: Self-pay | Admitting: Family Medicine

## 2019-06-24 DIAGNOSIS — E890 Postprocedural hypothyroidism: Secondary | ICD-10-CM

## 2019-06-24 MED ORDER — LEVOTHYROXINE SODIUM 50 MCG PO TABS: 50.0000 ug | ORAL_TABLET | Freq: Every day | ORAL | 0 refills | Status: DC

## 2019-06-24 NOTE — Telephone Encounter (Signed)
Needs refill for her thyroid medication, sent to [facility] on [address].  Thank you.

## 2019-06-24 NOTE — Telephone Encounter (Signed)
Attempted to reach pt. LVM for pt to call office to make appt for labs. Salvatore Marvel, CMA

## 2019-06-24 NOTE — Telephone Encounter (Signed)
Refill sent. She will need to get her levels checked prior to any more refills. Future lab order placed. Thanks!

## 2019-06-30 ENCOUNTER — Encounter: Payer: Self-pay | Admitting: Physician Assistant

## 2019-06-30 ENCOUNTER — Ambulatory Visit (INDEPENDENT_AMBULATORY_CARE_PROVIDER_SITE_OTHER): Payer: Medicare Other | Admitting: Physician Assistant

## 2019-06-30 ENCOUNTER — Other Ambulatory Visit: Payer: Self-pay

## 2019-06-30 VITALS — BP 132/80 | HR 78 | Temp 96.4°F | Ht 61.0 in | Wt 127.8 lb

## 2019-06-30 DIAGNOSIS — I10 Essential (primary) hypertension: Secondary | ICD-10-CM | POA: Diagnosis not present

## 2019-06-30 DIAGNOSIS — E039 Hypothyroidism, unspecified: Secondary | ICD-10-CM

## 2019-06-30 DIAGNOSIS — E782 Mixed hyperlipidemia: Secondary | ICD-10-CM

## 2019-06-30 DIAGNOSIS — E890 Postprocedural hypothyroidism: Secondary | ICD-10-CM | POA: Diagnosis not present

## 2019-06-30 DIAGNOSIS — I255 Ischemic cardiomyopathy: Secondary | ICD-10-CM | POA: Diagnosis not present

## 2019-06-30 DIAGNOSIS — I2510 Atherosclerotic heart disease of native coronary artery without angina pectoris: Secondary | ICD-10-CM

## 2019-06-30 DIAGNOSIS — I441 Atrioventricular block, second degree: Secondary | ICD-10-CM | POA: Diagnosis not present

## 2019-06-30 NOTE — Progress Notes (Signed)
Cardiology Office Note    Date:  07/02/2019   ID:  Stefan Blehm, DOB 05-Oct-1960, MRN [id number]  PCP:  Rory Percy, DO  Cardiologist:  Dr. Claiborne Billings   Chief Complaint  Patient presents with   Hospitalization Follow-up    seen for Dr. Claiborne Billings.    History of Present Illness:  Brooke Rivera is a 58 y.o. female with PMH of HTN, HLD, hypothyroidism and substance abuse who recently presented to the hospital on 05/17/2019 was chest pain and ruled in for STEMI.  She has used cocaine the day prior.  EKG showed ST elevation in the inferior and anterior leads with reciprocal ST depression along the lateral leads.  While in the ED, she had V. fib arrest and underwent CPR.  She received 1 shock and 1 dose of epinephrine.  Return rhythm was in atrial fibrillation.  She was subsequently intubated.  Emergent cardiac catheterization performed on 05/17/2019 showed 100% proximal RCA, 55% mid RCA, 90% proximal left circumflex artery, 30% proximal to mid LAD.  She underwent successful 2.25 x 20 mm Synergy DES placement to RCA reducing 100% occlusion down to 0%.  Staged intervention to proximal left circumflex was recommended following stabilization.  Postprocedure, she successfully converted back to sinus rhythm with amiodarone therapy.  She had transient hypotension in the Cath Lab contributed to RV infarction syndrome and was treated with IV fluid and a short course of Levophed.  Postprocedure, patient was placed on aspirin and Brilinta with recommendation to continue for minimum of 1 year without interruption.  Echocardiogram obtained on 05/18/2019 showed EF 45 to 50% mildly elevated pulmonary artery systolic pressure.  She returned to the Cath Lab on 05/19/2019 and underwent 2.25 x 28 mm Synergy DES to proximal and mid left circumflex artery.  During the hospitalization, she also had a second-degree type II AV block while receiving metoprolol.  The longest pause was around 4 seconds.  Beta-blocker was stopped and  outpatient cardiac heart monitor was recommended.  I last saw the patient on 05/31/2019, he denied any chest pain at the time.  Heart monitor at the time demonstrated at least 2 episodes of sinus pause lasting up to 5.8 seconds.  She was asymptomatic and was asleep at the time.  I referred the patient to EP for evaluation of sinus pause.  Given the fact he was asymptomatic during the episode, it was recommended to continue to monitor.  Patient presents today for cardiology office visit.  She has not had any chest pain, dizzy spell or feeling of passing out.  She continues to exercise without any issue.  Brilinta cost about $3.90 for her.  I recommended continue on the current medication.  She can follow-up with Dr. Claiborne Billings in 3 months.  There has been no recurrence of atrial fibrillation since discharge.  Heart rate is very regular on physical exam.  She is due for fasting lipid panel, LFT and TSH today.   Past Medical History:  Diagnosis Date   Allergy    mild    Cocaine use    Depression    rx not taking at this time   Goiter, lymphadenoid    thyroid   Hx of adenomatous polyp of colon 05/26/2017   Hyperlipidemia    Hypertension    STEMI (ST elevation myocardial infarction) ([place])    Systolic heart failure [facility])     Past Surgical History:  Procedure Laterality Date   COLONOSCOPY  04/2017   1 adenoma   CORONARY STENT INTERVENTION  N/A 05/19/2019   Procedure: CORONARY STENT INTERVENTION;  Surgeon: Leonie Man, MD;  Location: [place] [facility];  Service: Cardiovascular;  Laterality: N/A;   CORONARY/GRAFT ACUTE MI REVASCULARIZATION N/A 05/17/2019   Procedure: Coronary/Graft Acute MI Revascularization;  Surgeon: Troy Sine, MD;  Location: [place] [facility];  Service: Cardiovascular;  Laterality: N/A;   LEFT HEART CATH AND CORONARY ANGIOGRAPHY N/A 05/19/2019   Procedure: LEFT HEART CATH AND CORONARY ANGIOGRAPHY;  Surgeon: Leonie Man, MD;  Location: [place] [facility];   Service: Cardiovascular;  Laterality: N/A;   LEFT HEART CATH AND CORONARY ANGIOGRAPHY N/A 05/17/2019   Procedure: LEFT HEART CATH AND CORONARY ANGIOGRAPHY;  Surgeon: Troy Sine, MD;  Location: [place] [facility];  Service: Cardiovascular;  Laterality: N/A;   THYROIDECTOMY Bilateral 07/08/2015   Procedure: BILATERAL THYROIDECTOMY;  Surgeon: Ruby Cola, MD;  Location: [facility] OR;  Service: ENT;  Laterality: Bilateral;    Current Medications: Outpatient Medications Prior to Visit  Medication Sig Dispense Refill   albuterol (PROVENTIL HFA;VENTOLIN HFA) 108 (90 Base) MCG/ACT inhaler Inhale 2 puffs into the lungs every 6 (six) hours as needed for wheezing or shortness of breath. 1 Inhaler 0   aspirin EC 81 MG tablet Take 1 tablet (81 mg total) by mouth daily. 30 tablet 0   atorvastatin (LIPITOR) 80 MG tablet Take 1 tablet (80 mg total) by mouth daily at 6 PM. 90 tablet 1   lisinopril (ZESTRIL) 20 MG tablet Take 1 tablet (20 mg total) by mouth daily. 90 tablet 1   nitroGLYCERIN (NITROSTAT) 0.4 MG SL tablet Place 1 tablet (0.4 mg total) under the tongue every 5 (five) minutes x 3 doses as needed for chest pain. 25 tablet 2   ticagrelor (BRILINTA) 90 MG TABS tablet Take 1 tablet (90 mg total) by mouth 2 (two) times daily. 180 tablet 2   levothyroxine (SYNTHROID) 50 MCG tablet Take 1 tablet (50 mcg total) by mouth daily before breakfast. 30 tablet 0   No facility-administered medications prior to visit.      Allergies:   Cabbage   Social History   Socioeconomic History   Marital status: Single    Spouse name: Not on file   Number of children: Not on file   Years of education: Not on file   Highest education level: Not on file  Occupational History   Not on file  Social Needs   Financial resource strain: Not on file   Food insecurity    Worry: Not on file    Inability: Not on file   Transportation needs    Medical: Not on file    Non-medical: Not on file  Tobacco Use     Smoking status: Current Every Day Smoker    Packs/day: 0.25    Types: Cigarettes   Smokeless tobacco: Never Used   Tobacco comment: 4-5 cigs a day   Substance and Sexual Activity   Alcohol use: Yes    Alcohol/week: 0.0 standard drinks    Comment: last occurence 3 weeks ago   Drug use: No   Sexual activity: Not on file  Lifestyle   Physical activity    Days per week: Not on file    Minutes per session: Not on file   Stress: Not on file  Relationships   Social connections    Talks on phone: Not on file    Gets together: Not on file    Attends religious service: Not on file  Active member of club or organization: Not on file    Attends meetings of clubs or organizations: Not on file    Relationship status: Not on file  Other Topics Concern   Not on file  Social History Narrative   Not on file     Family History:  The patient's family history includes Cancer in her father and mother; Hyperlipidemia in her mother.   ROS:   Please see the history of present illness.    ROS All other systems reviewed and are negative.   PHYSICAL EXAM:   VS:  BP 132/80    Pulse 78    Temp (!) 96.4 F (35.8 C)    Ht 5\' 1"  (1.549 m)    Wt 127 lb 12.8 oz (58 kg)    SpO2 96%    BMI 24.15 kg/m    GEN: Well nourished, well developed, in no acute distress  HEENT: normal  Neck: no JVD, carotid bruits, or masses Cardiac: RRR; no murmurs, rubs, or gallops,no edema  Respiratory:  clear to auscultation bilaterally, normal work of breathing GI: soft, nontender, nondistended, + BS MS: no deformity or atrophy  Skin: warm and dry, no rash Neuro:  Alert and Oriented x 3, Strength and sensation are intact Psych: euthymic mood, full affect  Wt Readings from Last 3 Encounters:  06/30/19 127 lb 12.8 oz (58 kg)  06/12/19 126 lb (57.2 kg)  06/01/19 127 lb 9.6 oz (57.9 kg)      Studies/Labs Reviewed:   EKG:  EKG is not ordered today.    Recent Labs: 04/02/2019: BNP 28.2 05/20/2019:  Hemoglobin 10.6; Platelets 313 05/21/2019: BUN 5; Creatinine, Ser 0.90; Potassium 3.5; Sodium 137 06/30/2019: ALT 13; TSH 69.800   Lipid Panel    Component Value Date/Time   CHOL 180 06/30/2019 1530   TRIG 115 06/30/2019 1530   HDL 82 06/30/2019 1530   CHOLHDL 2.2 06/30/2019 1530   CHOLHDL 3.1 05/18/2019 0112   VLDL 14 05/18/2019 0112   LDLCALC 78 06/30/2019 1530   LDLDIRECT 119 (H) 08/08/2012 1131    Additional studies/ records that were reviewed today include:   Cath 05/17/2019  Prox RCA lesion is 100% stenosed.  Mid RCA lesion is 55% stenosed.  Prox Cx lesion is 90% stenosed.  Prox LAD to Mid LAD lesion is 30% stenosed.  Post intervention, there is a 0% residual stenosis.  LV end diastolic pressure is mildly elevated.  A stent was successfully placed.   Acute inferior ST segment elevation myocardial infarction complicated by VF cardiac arrest in the emergency room requiring CPR, defibrillation, and epinephrine with resultant development of atrial fibrillation.  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  Left circumflex vessel has a 90 to 95% eccentric stenosis proximally with TIMI-3 flow.  Total acute occlusion of the proximal RCA with initial TIMI 0 flow.  Successful PCI to the RCA proximal occlusion with ultimate insertion of a 2.25 x 20 mm Synergy DES stent postdilated to 2.5 mm with 100% occlusion being reduced to 0%.  There is a 55 to 60% area of diffuse irregularity proximal to and extending into the acute margin with TIMI-3 flow which was not intervened upon.  Transient hypotension probably contributed by RV infarction syndrome which responded to IV fluids and short course of levophed..  Successful  cardioversion back to sinus rhythm with reperfusion and following amiodarone therapy.  RECOMMENDATION: DAPT for minimum of 1 year.  Once patient is in the CCU, Brilinta will be administered  and cangrelor can be discontinued.  We will continue bivalirudin until completion of her current second bag.  Plan staged PCI to the proximal left circumflex vessel following stabilization.  [place] lipid-lowering therapy with high potency statin treatment with target LDL less than 70.  Will obtain 2D echo Doppler study to reassess RV and LV function following revascularization.  Avoidance of future cocaine.    Echo 05/18/2019  1. Left ventricular ejection fraction, by visual estimation, is 45 to 50%. The left ventricle has normal function. Normal left ventricular size. There is no left ventricular hypertrophy.  2. Basal and mid inferior wall is abnormal.  3. Elevated mean left atrial pressure.  4. Left ventricular diastolic Doppler parameters are consistent with impaired relaxation pattern of LV diastolic filling.  5. Global right ventricle has normal systolic function.The right ventricular size is mildly enlarged. No increase in right ventricular wall thickness.  6. Left atrial size was severely dilated.  7. Right atrial size was normal.  8. The mitral valve is normal in structure. Trace mitral valve regurgitation. No evidence of mitral stenosis.  9. The tricuspid valve is normal in structure. Tricuspid valve regurgitation is mild. 10. The aortic valve is normal in structure. Aortic valve regurgitation was not visualized by color flow Doppler. Structurally normal aortic valve, with no evidence of sclerosis or stenosis. 11. The pulmonic valve was normal in structure. Pulmonic valve regurgitation is not visualized by color flow Doppler. 12. Mildly elevated pulmonary artery systolic pressure. 13. The inferior vena cava is normal in size with <50% respiratory variability, suggesting right atrial pressure of 8 mmHg.   Cath 05/19/2019  LESION COMPLEX #1: Prox Cx lesion is 90% stenosed - Prox Cx to Mid Cx lesion is 60% stenosed crosses OM1  A drug-eluting stent was successfully placed covering both  lesions (crossing OM1) using a STENT SYNERGY DES 2.25X28 -> postdilated with stent balloon to 2.45 mm  Post intervention, there is a 0% residual stenosis.  -------------------------------------------  LESION #2: Ramus lesion is 80% stenosed.  A drug-eluting stent was successfully placed using a STENT SYNERGY DES 2.25X12 -> postdilated to 2.55 mm  Post intervention, there is a 0% residual stenosis.  -------------------------------------  LV end diastolic pressure is moderately elevated.   SUMMARY  Successful two-vessel PCI: Proximal-mid LCx (Synergy DES 2.25 mm x 28 mm - 2.45 mm), ostial RI (Synergy DES 2.25 mm x 12 mm - 2.55 mm)  Continues to have moderately elevated LVEDP of 21 mm.  RECOMMENDATIONS  Transfer to 6 Central post procedure unit after TR band removal.  Continue aggressive risk modification  Further plans per primary service   ASSESSMENT:    1. Coronary artery disease involving native coronary artery of native heart without angina pectoris   2. Hypertension, benign   3. Mixed hyperlipidemia   4. Hypothyroidism, unspecified type   5. Mobitz II      PLAN:  In order of problems listed above:  1. CAD: Denies any recent chest discomfort.  Continue aspirin and Brilinta  2. Hypertension: Blood pressure stable  3. Hyperlipidemia: Continue statin therapy.  Obtain fasting lipid panel and LFT today  4. Hypothyroidism: Obtain TSH.  Managed by primary care provider  5. Mobitz 2 heart block: Not on any AV nodal blocking agent.  Heart monitor demonstrated sinus pause up to 5.8 seconds.  Patient was referred and seen by Dr. Rayann Heman.  Sinus pause occurred while she was asleep.  She  was hesitant to proceed with pacemaker at the time.    Medication Adjustments/Labs and Tests Ordered: Current medicines are reviewed at length with the patient today.  Concerns regarding medicines are outlined above.  Medication changes, Labs and Tests ordered today are listed in the  Patient Instructions below. Patient Instructions  Medication Instructions:  Your physician recommends that you continue on your current medications as directed. Please refer to the Current Medication list given to you today.  *If you need a refill on your cardiac medications before your next appointment, please call your pharmacy*  Lab Work: You will have labs (blood work) drawn today-Previously ordered:  Fasting Lipid Panel  Liver Function   TSH  If you have labs (blood work) drawn today and your tests are completely normal, you will receive your results only by:  [place] (if you have MyChart) OR  A paper copy in the mail If you have any lab test that is abnormal or we need to change your treatment, we will call you to review the results.  Testing/Procedures: NONE ordered at this time of appointment   Follow-Up: At [facility], you and your health needs are our priority.  As part of our continuing mission to provide you with exceptional heart care, we have created designated Provider Care Teams.  These Care Teams include your primary Cardiologist (physician) and Advanced Practice Providers (APPs -  Physician Assistants and Nurse Practitioners) who all work together to provide you with the care you need, when you need it.  Your next appointment:   3 months  The format for your next appointment:   In Person  Provider:   Shelva Majestic, MD  Other Instructions      Signed, Almyra Deforest, [place]  07/02/2019 11:34 PM    [address], [place], [place]  [address] Phone: [phone]; Fax: [phone]

## 2019-06-30 NOTE — Patient Instructions (Addendum)
Medication Instructions:  Your physician recommends that you continue on your current medications as directed. Please refer to the Current Medication list given to you today.  *If you need a refill on your cardiac medications before your next appointment, please call your pharmacy*  Lab Work: You will have labs (blood work) drawn today-Previously ordered:  Fasting Lipid Panel  Liver Function   TSH  If you have labs (blood work) drawn today and your tests are completely normal, you will receive your results only by: [facility] MyChart Message (if you have MyChart) OR . A paper copy in the mail If you have any lab test that is abnormal or we need to change your treatment, we will call you to review the results.  Testing/Procedures: NONE ordered at this time of appointment   Follow-Up: At [facility], you and your health needs are our priority.  As part of our continuing mission to provide you with exceptional heart care, we have created designated Provider Care Teams.  These Care Teams include your primary Cardiologist (physician) and Advanced Practice Providers (APPs -  Physician Assistants and Nurse Practitioners) who all work together to provide you with the care you need, when you need it.  Your next appointment:   3 months  The format for your next appointment:   In Person  Provider:   Shelva Majestic, MD  Other Instructions

## 2019-07-01 ENCOUNTER — Other Ambulatory Visit: Payer: Self-pay | Admitting: Family Medicine

## 2019-07-01 LAB — LIPID PANEL
Chol/HDL Ratio: 2.2 ratio (ref 0.0–4.4)
Cholesterol, Total: 180 mg/dL (ref 100–199)
HDL: 82 mg/dL (ref >39)
LDL Chol Calc (NIH): 78 mg/dL (ref 0–99)
Triglycerides: 115 mg/dL (ref 0–149)
VLDL Cholesterol Cal: 20 mg/dL (ref 5–40)

## 2019-07-01 LAB — HEPATIC FUNCTION PANEL
ALT: 13 IU/L (ref 0–32)
AST: 16 IU/L (ref 0–40)
Albumin: 4.6 g/dL (ref 3.8–4.9)
Alkaline Phosphatase: 52 IU/L (ref 39–117)
Bilirubin Total: 0.5 mg/dL (ref 0.0–1.2)
Bilirubin, Direct: 0.14 mg/dL (ref 0.00–0.40)
Total Protein: 7.1 g/dL (ref 6.0–8.5)

## 2019-07-01 LAB — TSH: TSH: 69.8 u[IU]/mL — ABNORMAL HIGH (ref 0.450–4.500)

## 2019-07-01 MED ORDER — LEVOTHYROXINE SODIUM 75 MCG PO TABS: 75.0000 ug | ORAL_TABLET | Freq: Every day | ORAL | 0 refills | Status: DC

## 2019-07-01 NOTE — Addendum Note (Signed)
Addended by: Myles Gip on: 07/01/2019 09:52 AM   Modules accepted: Orders

## 2019-07-02 ENCOUNTER — Encounter: Payer: Self-pay | Admitting: Physician Assistant

## 2019-07-09 ENCOUNTER — Telehealth (HOSPITAL_COMMUNITY): Payer: Self-pay

## 2019-07-09 NOTE — Telephone Encounter (Signed)
No response from pt regarding CR.  Closed referral.  

## 2019-10-01 ENCOUNTER — Ambulatory Visit (INDEPENDENT_AMBULATORY_CARE_PROVIDER_SITE_OTHER): Payer: Medicare Other | Admitting: Cardiovascular Disease

## 2019-10-01 ENCOUNTER — Encounter: Payer: Self-pay | Admitting: Cardiovascular Disease

## 2019-10-01 ENCOUNTER — Other Ambulatory Visit: Payer: Self-pay | Admitting: Family Medicine

## 2019-10-01 ENCOUNTER — Other Ambulatory Visit: Payer: Self-pay

## 2019-10-01 DIAGNOSIS — I255 Ischemic cardiomyopathy: Secondary | ICD-10-CM

## 2019-10-01 DIAGNOSIS — I2510 Atherosclerotic heart disease of native coronary artery without angina pectoris: Secondary | ICD-10-CM

## 2019-10-01 DIAGNOSIS — I10 Essential (primary) hypertension: Secondary | ICD-10-CM

## 2019-10-01 DIAGNOSIS — E039 Hypothyroidism, unspecified: Secondary | ICD-10-CM | POA: Diagnosis not present

## 2019-10-01 DIAGNOSIS — I2111 ST elevation (STEMI) myocardial infarction involving right coronary artery: Secondary | ICD-10-CM | POA: Diagnosis not present

## 2019-10-01 DIAGNOSIS — I441 Atrioventricular block, second degree: Secondary | ICD-10-CM | POA: Diagnosis not present

## 2019-10-01 DIAGNOSIS — I455 Other specified heart block: Secondary | ICD-10-CM

## 2019-10-01 DIAGNOSIS — E785 Hyperlipidemia, unspecified: Secondary | ICD-10-CM

## 2019-10-01 MED ORDER — LISINOPRIL 40 MG PO TABS: 40.0000 mg | ORAL_TABLET | Freq: Every day | ORAL | 3 refills | Status: DC

## 2019-10-01 MED ORDER — AMLODIPINE BESYLATE 5 MG PO TABS: 5.0000 mg | ORAL_TABLET | Freq: Every day | ORAL | 3 refills | Status: DC

## 2019-10-01 NOTE — Progress Notes (Signed)
Cardiology Office Note    Date:  10/07/2019   ID:  Brooke Rivera, DOB Oct 12, 1960, MRN [id number]  PCP:  Rory Percy, DO  Cardiologist:  Shelva Majestic, MD   Initial office visit with me  History of Present Illness:  Brooke Rivera is a 59 y.o. female who suffered an acute inferior ST segment in September 2020.  She presents to the office for initial evaluation with me.  Brooke Rivera has a history of hypertension, hyperlipidemia, and hypothyroidism.  She also has a history of substance abuse with cocaine use.  On May 17, 2019 she developed new onset chest pain and had used cocaine on the previous day.  She presented to the emergency room where ECG showed acute inferior ST elevation.  Upon moving to a stretcher, she went into ventricular fibrillation cardiac arrest requiring CPR, effort epinephrine, and was successfully defibrillated.  She ultimately developed ROSC and with rhythm return was in rapid atrial fibrillation with hyperacute ST segment elevation inferolaterally.  I took her emergently to the cardiac catheterization laboratory where she was found to have total occlusion of the proximal RCA with TIMI 0 flow.  The left circumflex vessel had 9095% eccentric stenosis proximally with TIMI-3 flow.  There was mild 30% irregularity in the LAD after the takeoff of the first septal diagonal vessel.  She underwent successful PCI to the RCA proximal occlusion with ultimate insertion of a 2.25 x 20 mm Synergy stent postdilated to 2.5 mm with the occlusion being reduced to 0%.  There also was an area of 55 to 60% diffuse irregularity proximal to and extending into the acute margin with TIMI-3 flow which was not intervened upon.  During the procedure she developed transient hypotension probably contributed by RV infarction syndrome which responded to IV fluids and a short course of Levophed.  She underwent successful cardioversion back to sinus rhythm with reperfusion following amiodarone therapy.   An echo Doppler study on May 18, 2019 showed an EF of 45 to 50%.  She had mildly elevated pulmonary pressure.  On May 19, 2019 she underwent staged PCI with stenting of her mid left circumflex vessel with a 2.25 x 28 mm Synergy DES stent.  And stenting of the ostium of her ramus intermediate vessel.  During her hospitalization she had developed transient second-degree AV block while receiving metoprolol.  Beta-blocker was stopped and outpatient cardiac monitor was recommended.    Subsequent to her hospitalization she has been evaluated by Almyra Deforest, PA and was last seen on June 30, 2019.Marland Kitchen Her cardiac monitor demonstrated at least 2 episodes of sinus pause lasting up to 5.8 seconds.  She was asymptomatic and sleeping.  She was referred to EP for evaluation of her sinus pause and it was recommended to continue to monitor.  She has not had any recurrence of atrial fibrillation.  Presently, she denies any recurrent chest pain.  She continues to be on aspirin/Brilinta 90 mg twice a day, lisinopril 20 mg, atorvastatin 80 mg, and levothyroxine 75 mcg.  Presently she feels asymptomatic with reference to chest pain.  She is unaware of any cardiac rhythm disorder.  She denies recent cocaine use.  In the past she reported eating a high salt diet.  She presents for initial evaluation with me.   Past Medical History:  Diagnosis Date  . Allergy    mild   . Cocaine use   . Depression    rx not taking at this time  . Goiter, lymphadenoid  thyroid  . Hx of adenomatous polyp of colon 05/26/2017  . Hyperlipidemia   . Hypertension   . STEMI (ST elevation myocardial infarction) ([place])   . Systolic heart failure [facility])     Past Surgical History:  Procedure Laterality Date  . COLONOSCOPY  04/2017   1 adenoma  . CORONARY STENT INTERVENTION N/A 05/19/2019   Procedure: CORONARY STENT INTERVENTION;  Surgeon: Leonie Man, MD;  Location: [place] [facility];  Service: Cardiovascular;  Laterality: N/A;   . CORONARY/GRAFT ACUTE MI REVASCULARIZATION N/A 05/17/2019   Procedure: Coronary/Graft Acute MI Revascularization;  Surgeon: Troy Sine, MD;  Location: [place] [facility];  Service: Cardiovascular;  Laterality: N/A;  . LEFT HEART CATH AND CORONARY ANGIOGRAPHY N/A 05/19/2019   Procedure: LEFT HEART CATH AND CORONARY ANGIOGRAPHY;  Surgeon: Leonie Man, MD;  Location: [place] [facility];  Service: Cardiovascular;  Laterality: N/A;  . LEFT HEART CATH AND CORONARY ANGIOGRAPHY N/A 05/17/2019   Procedure: LEFT HEART CATH AND CORONARY ANGIOGRAPHY;  Surgeon: Troy Sine, MD;  Location: [place] [facility];  Service: Cardiovascular;  Laterality: N/A;  . THYROIDECTOMY Bilateral 07/08/2015   Procedure: BILATERAL THYROIDECTOMY;  Surgeon: Ruby Cola, MD;  Location: [facility] OR;  Service: ENT;  Laterality: Bilateral;    Current Medications: Outpatient Medications Prior to Visit  Medication Sig Dispense Refill  . albuterol (PROVENTIL HFA;VENTOLIN HFA) 108 (90 Base) MCG/ACT inhaler Inhale 2 puffs into the lungs every 6 (six) hours as needed for wheezing or shortness of breath. 1 Inhaler 0  . aspirin EC 81 MG tablet Take 1 tablet (81 mg total) by mouth daily. 30 tablet 0  . atorvastatin (LIPITOR) 80 MG tablet Take 1 tablet (80 mg total) by mouth daily at 6 PM. 90 tablet 1  . nitroGLYCERIN (NITROSTAT) 0.4 MG SL tablet Place 1 tablet (0.4 mg total) under the tongue every 5 (five) minutes x 3 doses as needed for chest pain. 25 tablet 2  . ticagrelor (BRILINTA) 90 MG TABS tablet Take 1 tablet (90 mg total) by mouth 2 (two) times daily. 180 tablet 2  . levothyroxine (SYNTHROID) 75 MCG tablet Take 1 tablet (75 mcg total) by mouth daily before breakfast. 90 tablet 0  . lisinopril (ZESTRIL) 20 MG tablet Take 1 tablet (20 mg total) by mouth daily. 90 tablet 1   No facility-administered medications prior to visit.     Allergies:   Cabbage   Social History   Socioeconomic History  . Marital status: Single      Spouse name: Not on file  . Number of children: Not on file  . Years of education: Not on file  . Highest education level: Not on file  Occupational History  . Not on file  Tobacco Use  . Smoking status: Current Every Day Smoker    Packs/day: 0.25    Types: Cigarettes  . Smokeless tobacco: Never Used  . Tobacco comment: 4-5 cigs a day   Substance and Sexual Activity  . Alcohol use: Yes    Alcohol/week: 0.0 standard drinks    Comment: last occurence 3 weeks ago  . Drug use: No  . Sexual activity: Not on file  Other Topics Concern  . Not on file  Social History Narrative  . Not on file   Social Determinants of Health   Financial Resource Strain:   . Difficulty of Paying Living Expenses: Not on file  Food Insecurity:   . Worried About Charity fundraiser in the Last  Year: Not on file  . Ran Out of Food in the Last Year: Not on file  Transportation Needs:   . Lack of Transportation (Medical): Not on file  . Lack of Transportation (Non-Medical): Not on file  Physical Activity:   . Days of Exercise per Week: Not on file  . Minutes of Exercise per Session: Not on file  Stress:   . Feeling of Stress : Not on file  Social Connections:   . Frequency of Communication with Friends and Family: Not on file  . Frequency of Social Gatherings with Friends and Family: Not on file  . Attends Religious Services: Not on file  . Active Member of Clubs or Organizations: Not on file  . Attends Archivist Meetings: Not on file  . Marital Status: Not on file     Family History:  The patient's family history includes Cancer in her father and mother; Hyperlipidemia in her mother.   ROS General: Negative; No fevers, chills, or night sweats;  HEENT: Negative; No changes in vision or hearing, sinus congestion, difficulty swallowing Pulmonary: Negative; No cough, wheezing, shortness of breath, hemoptysis Cardiovascular: Negative; No chest pain, presyncope, syncope,  palpitations GI: Negative; No nausea, vomiting, diarrhea, or abdominal pain GU: Negative; No dysuria, hematuria, or difficulty voiding Musculoskeletal: Negative; no myalgias, joint pain, or weakness Hematologic/Oncology: Negative; no easy bruising, bleeding Endocrine: Positive for hypothyroidism Neuro: Negative; no changes in balance, headaches Skin: Negative; No rashes or skin lesions Psychiatric: Negative; No behavioral problems, depression Sleep: Negative; No snoring, daytime sleepiness, hypersomnolence, bruxism, restless legs, hypnogognic hallucinations, no cataplexy Other comprehensive 14 point system review is negative.   PHYSICAL EXAM:   VS:  BP (!) 172/100   Pulse 70   Ht 5' 1"  (1.549 m)   Wt 130 lb 9.6 oz (59.2 kg)   BMI 24.68 kg/m     Repeat blood pressure by me 178/96  Wt Readings from Last 3 Encounters:  10/01/19 130 lb 9.6 oz (59.2 kg)  06/30/19 127 lb 12.8 oz (58 kg)  06/12/19 126 lb (57.2 kg)    General: Alert, oriented, no distress.  Skin: normal turgor, no rashes, warm and dry HEENT: Normocephalic, atraumatic. Pupils equal round and reactive to light; sclera anicteric; extraocular muscles intact;  Nose without nasal septal hypertrophy Mouth/Parynx benign; Mallinpatti scale 3 Neck: No JVD, no carotid bruits; normal carotid upstroke Lungs: clear to ausculatation and percussion; no wheezing or rales Chest wall: without tenderness to palpitation Heart: PMI not displaced, RRR, s1 s2 normal, 1/6 systolic murmur, no diastolic murmur, no rubs, gallops, thrills, or heaves Abdomen: soft, nontender; no hepatosplenomehaly, BS+; abdominal aorta nontender and not dilated by palpation. Back: no CVA tenderness Pulses 2+ Musculoskeletal: full range of motion, normal strength, no joint deformities Extremities: no clubbing cyanosis or edema, Homan's sign negative  Neurologic: grossly nonfocal; Cranial nerves grossly wnl Psychologic: Normal mood and affect   Studies/Labs  Reviewed:   EKG:  EKG is ordered today.  ECG (independently read by me): Normal sinus rhythm at 70 bpm.  Inferolateral T wave abnormality.  Borderline first-degree AV block with a PR normal at 2 8 ms.  No ectopy  Recent Labs: BMP Latest Ref Rng & Units 10/01/2019 05/21/2019 05/20/2019  Glucose 65 - 99 mg/dL 86 86 100(H)  BUN 8 - 27 mg/dL 10 5(L) <5(L)  Creatinine 0.57 - 1.00 mg/dL 0.87 0.90 0.86  BUN/Creat Ratio 12 - 28 11(L) - -  Sodium 134 - 144 mmol/L 141 137 138  Potassium 3.5 - 5.2 mmol/L 5.2 3.5 3.5  Chloride 96 - 106 mmol/L 101 103 104  CO2 20 - 29 mmol/L 25 22 23   Calcium 8.7 - 10.3 mg/dL 9.9 9.1 8.6(L)     Hepatic Function Latest Ref Rng & Units 10/01/2019 06/30/2019 05/18/2019  Total Protein 6.0 - 8.5 g/dL 7.2 7.1 6.1(L)  Albumin 3.8 - 4.9 g/dL 4.4 4.6 3.3(L)  AST 0 - 40 IU/L 11 16 179(H)  ALT 0 - 32 IU/L 8 13 81(H)  Alk Phosphatase 39 - 117 IU/L 52 52 44  Total Bilirubin 0.0 - 1.2 mg/dL 0.3 0.5 0.7  Bilirubin, Direct 0.00 - 0.40 mg/dL - 0.14 -    CBC Latest Ref Rng & Units 10/01/2019 05/20/2019 05/18/2019  WBC 3.4 - 10.8 x10E3/uL 7.4 10.2 12.4(H)  Hemoglobin 11.1 - 15.9 g/dL 12.4 10.6(L) 11.0(L)  Hematocrit 34.0 - 46.6 % 39.0 33.6(L) 35.0(L)  Platelets 150 - 450 x10E3/uL 443 313 357   Lab Results  Component Value Date   MCV 76 (L) 10/01/2019   MCV 78.3 (L) 05/20/2019   MCV 78.0 (L) 05/18/2019   Lab Results  Component Value Date   TSH 24.200 (H) 10/01/2019   Lab Results  Component Value Date   HGBA1C 5.7 (H) 05/18/2019     BNP    Component Value Date/Time   BNP 28.2 04/02/2019 1538    ProBNP No results found for: PROBNP   Lipid Panel     Component Value Date/Time   CHOL 180 06/30/2019 1530   TRIG 115 06/30/2019 1530   HDL 82 06/30/2019 1530   CHOLHDL 2.2 06/30/2019 1530   CHOLHDL 3.1 05/18/2019 0112   VLDL 14 05/18/2019 0112   LDLCALC 78 06/30/2019 1530   LDLDIRECT 119 (H) 08/08/2012 1131   LABVLDL 20 06/30/2019 1530     RADIOLOGY: No results  found.   Additional studies/ records that were reviewed today include:  CATH/PCI 05/19/2019  Prox RCA lesion is 100% stenosed.  Mid RCA lesion is 55% stenosed.  Prox Cx lesion is 90% stenosed.  Prox LAD to Mid LAD lesion is 30% stenosed.  Post intervention, there is a 0% residual stenosis.  LV end diastolic pressure is mildly elevated.  A stent was successfully placed.   Acute inferior ST segment elevation myocardial infarction complicated by VF cardiac arrest in the emergency room requiring CPR, defibrillation, and epinephrine with resultant development of atrial fibrillation.  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  Left circumflex vessel has a 90 to 95% eccentric stenosis proximally with TIMI-3 flow.  Total acute occlusion of the proximal RCA with initial TIMI 0 flow.  Successful PCI to the RCA proximal occlusion with ultimate insertion of a 2.25 x 20 mm Synergy DES stent postdilated to 2.5 mm with 100% occlusion being reduced to 0%.  There is a 55 to 60% area of diffuse irregularity proximal to and extending into the acute margin with TIMI-3 flow which was not intervened upon.  Transient hypotension probably contributed by RV infarction syndrome which responded to IV fluids and short course of levophed..  Successful  cardioversion back to sinus rhythm with reperfusion and following amiodarone therapy.  RECOMMENDATION: DAPT for minimum of 1 year.  Once patient is in the CCU, Brilinta will be administered and cangrelor can be discontinued.  We will continue bivalirudin until completion of her current second bag.  Plan staged PCI to the proximal left circumflex vessel following  stabilization.  [place] lipid-lowering therapy with high potency statin treatment with target LDL less than 70.  Will obtain 2D echo Doppler study to reassess RV and LV function following revascularization.  Avoidance of future  cocaine.  Intervention      PCI/: 05/19/2019  LESION COMPLEX #1: Prox Cx lesion is 90% stenosed - Prox Cx to Mid Cx lesion is 60% stenosed crosses OM1  A drug-eluting stent was successfully placed covering both lesions (crossing OM1) using a STENT SYNERGY DES 2.25X28 -> postdilated with stent balloon to 2.45 mm  Post intervention, there is a 0% residual stenosis.  -------------------------------------------  LESION #2: Ramus lesion is 80% stenosed.  A drug-eluting stent was successfully placed using a STENT SYNERGY DES 2.25X12 -> postdilated to 2.55 mm  Post intervention, there is a 0% residual stenosis.  -------------------------------------  LV end diastolic pressure is moderately elevated.   SUMMARY  Successful two-vessel PCI: Proximal-mid LCx (Synergy DES 2.25 mm x 28 mm - 2.45 mm), ostial RI (Synergy DES 2.25 mm x 12 mm - 2.55 mm)  Continues to have moderately elevated LVEDP of 21 mm.  RECOMMENDATIONS  Transfer to 6 Central post procedure unit after TR band removal.  Continue aggressive risk modification  Further plans per primary service   Intervention    Intervention       ASSESSMENT:    1. ST elevation myocardial infarction involving right coronary artery ([place]): 4/96/7591 complicated by VF cardiac arrest requiring defibrillation and CPR   2. Coronary artery disease involving native coronary artery of native heart without angina pectoris   3. Accelerated hypertension   4. Ischemic cardiomyopathy   5. Hypothyroidism, unspecified type   6. Hypertension, benign   7. History of 2nd degree AV block   8. Hyperlipidemia with target LDL less than 70   9. Sinus pause     PLAN:   Brooke Rivera is a 59 year old African-American female with a history of hypertension, hyperlipidemia, hypothyroidism, as well as cocaine use.  She developed acute inferior ST elevation the day following cocaine use in September 2020 presented with ST segment elevation  with resultant VF cardiac arrest requiring CPR defibrillation and epinephrine with resultant development of atrial fibrillation.  As noted above, she underwent successful initial intervention to RCA with subsequent staged PCI to her circumflex and ramus vessel 2 days later.  Echo Doppler study showed EF at 45 to 50%.  Presently, she denies any significant recurrent anginal type symptoms.  She had previously developed significant pauses noted during sleep leading to discontinuance of metoprolol.  Her blood pressure today is significantly elevated with stage II hypertension and on repeat by me was 178/96.  I have recommended addition of amlodipine 5 mg and titration of lisinopril to 40 mg daily for more optimal blood pressure control.  She has continued to be on DAPT with aspirin/Brilinta.  She has not been compliant with low-sodium diet and this was discussed in detail.  She is on atorvastatin 80 mg since her acute coronary syndrome.  Follow-up laboratory November 2020 showed total cholesterol 180 LDL cholesterol of 78.  Target LDL is less than 70 and if she is unable to reach target I would recommend the addition of Zetia 10 mg to her current atorvastatin milligrams dose.  I am rechecking chemistry, TSH, CBC.  She is on levothyroxine for hypothyroidism.  In the past she was found to have Mobitz 2 heart block and heart monitor demonstrated sinus pauses up to 5.8 seconds.  She was hesitant  to proceed with pacemaker.  She is no longer on beta-blocker therapy.  It may be worthwhile in the future to schedule her for sleep study to make certain her bradycardia may not be a consequence of sleep apnea.  With her significant blood pressure elevation today, I will have her see Almyra Deforest in 3 weeks for reassessment of blood pressure medication adjustment if necessary.  I will see her in 2 months for follow-up Cardiologic evaluation.  Medication Adjustments/Labs and Tests Ordered: Current medicines are reviewed at length with  the patient today.  Concerns regarding medicines are outlined above.  Medication changes, Labs and Tests ordered today are listed in the Patient Instructions below. Patient Instructions  Medication Instructions:  BEGIN TAKING AMLODIPINE 5MG (1 TABLET) DAILY INCREASE LISINOPRIL TO 40MG DAILY (1 TABLET) DAILY *If you need a refill on your cardiac medications before your next appointment, please call your pharmacy*  Lab Work: CMET, TSH, CBC TODAY If you have labs (blood work) drawn today and your tests are completely normal, you will receive your results only by: Marland Kitchen MyChart Message (if you have MyChart) OR . A paper copy in the mail If you have any lab test that is abnormal or we need to change your treatment, we will call you to review the results.  Follow-Up: At [facility], you and your health needs are our priority.  As part of our continuing mission to provide you with exceptional heart care, we have created designated Provider Care Teams.  These Care Teams include your primary Cardiologist (physician) and Advanced Practice Providers (APPs -  Physician Assistants and Nurse Practitioners) who all work together to provide you with the care you need, when you need it.  Your next appointment:   2 month(s)  The format for your next appointment:   In Person  Provider:   Shelva Majestic, MD  Other Instructions FOLLOW UP [place] 3-4 WEEKS     Signed, Shelva Majestic, MD  10/07/2019 6:53 PM    [facility] [address] [place], [place], [place]  [address] Phone: [phone]

## 2019-10-01 NOTE — Patient Instructions (Signed)
Medication Instructions:  BEGIN TAKING AMLODIPINE 5MG  (1 TABLET) DAILY INCREASE LISINOPRIL TO 40MG  DAILY (1 TABLET) DAILY *If you need a refill on your cardiac medications before your next appointment, please call your pharmacy*  Lab Work: CMET, TSH, CBC TODAY If you have labs (blood work) drawn today and your tests are completely normal, you will receive your results only by: Marland Kitchen MyChart Message (if you have MyChart) OR . A paper copy in the mail If you have any lab test that is abnormal or we need to change your treatment, we will call you to review the results.  Follow-Up: At [facility], you and your health needs are our priority.  As part of our continuing mission to provide you with exceptional heart care, we have created designated Provider Care Teams.  These Care Teams include your primary Cardiologist (physician) and Advanced Practice Providers (APPs -  Physician Assistants and Nurse Practitioners) who all work together to provide you with the care you need, when you need it.  Your next appointment:   2 month(s)  The format for your next appointment:   In Person  Provider:   Shelva Majestic, MD  Other Instructions FOLLOW UP WITH HOA MENG IN 3-4 WEEKS

## 2019-10-02 LAB — CBC
Hematocrit: 39 % (ref 34.0–46.6)
Hemoglobin: 12.4 g/dL (ref 11.1–15.9)
MCH: 24.1 pg — ABNORMAL LOW (ref 26.6–33.0)
MCHC: 31.8 g/dL (ref 31.5–35.7)
MCV: 76 fL — ABNORMAL LOW (ref 79–97)
Platelets: 443 10*3/uL (ref 150–450)
RBC: 5.14 x10E6/uL (ref 3.77–5.28)
RDW: 17.1 % — ABNORMAL HIGH (ref 11.7–15.4)
WBC: 7.4 10*3/uL (ref 3.4–10.8)

## 2019-10-02 LAB — COMPREHENSIVE METABOLIC PANEL
ALT: 8 IU/L (ref 0–32)
AST: 11 IU/L (ref 0–40)
Albumin/Globulin Ratio: 1.6 (ref 1.2–2.2)
Albumin: 4.4 g/dL (ref 3.8–4.9)
Alkaline Phosphatase: 52 IU/L (ref 39–117)
BUN/Creatinine Ratio: 11 — ABNORMAL LOW (ref 12–28)
BUN: 10 mg/dL (ref 8–27)
Bilirubin Total: 0.3 mg/dL (ref 0.0–1.2)
CO2: 25 mmol/L (ref 20–29)
Calcium: 9.9 mg/dL (ref 8.7–10.3)
Chloride: 101 mmol/L (ref 96–106)
Creatinine, Ser: 0.87 mg/dL (ref 0.57–1.00)
GFR calc Af Amer: 84 mL/min/{1.73_m2} (ref >59)
GFR calc non Af Amer: 73 mL/min/{1.73_m2} (ref >59)
Globulin, Total: 2.8 g/dL (ref 1.5–4.5)
Glucose: 86 mg/dL (ref 65–99)
Potassium: 5.2 mmol/L (ref 3.5–5.2)
Sodium: 141 mmol/L (ref 134–144)
Total Protein: 7.2 g/dL (ref 6.0–8.5)

## 2019-10-02 LAB — TSH: TSH: 24.2 u[IU]/mL — ABNORMAL HIGH (ref 0.450–4.500)

## 2019-10-07 ENCOUNTER — Encounter: Payer: Self-pay | Admitting: Cardiovascular Disease

## 2019-10-16 ENCOUNTER — Encounter: Payer: Self-pay | Admitting: Physician Assistant

## 2019-10-16 ENCOUNTER — Other Ambulatory Visit: Payer: Self-pay

## 2019-10-16 ENCOUNTER — Ambulatory Visit (INDEPENDENT_AMBULATORY_CARE_PROVIDER_SITE_OTHER): Payer: Medicare Other | Admitting: Physician Assistant

## 2019-10-16 VITALS — BP 170/84 | HR 68 | Temp 95.0°F | Ht 61.0 in | Wt 131.2 lb

## 2019-10-16 DIAGNOSIS — I2510 Atherosclerotic heart disease of native coronary artery without angina pectoris: Secondary | ICD-10-CM

## 2019-10-16 DIAGNOSIS — I255 Ischemic cardiomyopathy: Secondary | ICD-10-CM

## 2019-10-16 DIAGNOSIS — E785 Hyperlipidemia, unspecified: Secondary | ICD-10-CM | POA: Diagnosis not present

## 2019-10-16 DIAGNOSIS — I10 Essential (primary) hypertension: Secondary | ICD-10-CM

## 2019-10-16 DIAGNOSIS — I441 Atrioventricular block, second degree: Secondary | ICD-10-CM | POA: Diagnosis not present

## 2019-10-16 MED ORDER — AMLODIPINE BESYLATE 10 MG PO TABS: 10.0000 mg | ORAL_TABLET | Freq: Every day | ORAL | 0 refills | Status: DC

## 2019-10-16 NOTE — Progress Notes (Signed)
Cardiology Office Note:    Date:  10/18/2019   ID:  Brooke Rivera, DOB 04/15/1961, MRN [id number]  PCP:  Rory Percy, DO  Cardiologist:  Shelva Majestic, MD  Electrophysiologist:  None   Referring MD: Rory Percy, DO   Chief Complaint  Patient presents with  . Follow-up    seen for Dr. Claiborne Billings.     History of Present Illness:    Brooke Rivera is a 59 y.o. female with a hx of HTN, HLD,hypothyroidism and substance abuse who presented to the hospital on 05/17/2019 was chest pain and ruled in for STEMI. She has used cocaine the day prior. EKG showed ST elevation in the inferior and anterior leads with reciprocal ST depression along the lateral leads. While in the ED, she had V. fib arrest and underwent CPR. She received 1 shock and 1 dose of epinephrine. Return rhythm was in atrial fibrillation. She was subsequently intubated. Emergent cardiac catheterization performed on 05/17/2019 showed 100% proximal RCA, 55% mid RCA, 90% proximal left circumflex artery, 30% proximal to mid LAD. She underwent successful 2.25 x 20 mm Synergy DES placement to RCA reducing 100% occlusion down to 0%. Staged intervention to proximal left circumflex was recommended following stabilization. Postprocedure, she successfully converted back to sinus rhythm with amiodarone therapy. She had transient hypotension in the Cath Lab contributed to RV infarction syndrome and was treated with IV fluid and a short course of Levophed. Postprocedure, patient was placed on aspirin and Brilinta with recommendation to continue for minimum of 1 year without interruption. Echocardiogram obtained on 05/18/2019 showed EF 45 to 50% mildly elevated pulmonary artery systolic pressure. She returned to the Cath Lab on 05/19/2019 and underwent 2.25 x 28 mm Synergy DES to proximal and mid left circumflex artery. During the hospitalization, she also had a second-degree type II AV block while receiving metoprolol. The longest pause  was around 4 seconds. Beta-blocker was stopped and outpatient cardiac heart monitor was recommended.  Postprocedure, patient was discharged on her monitor.  Her monitor demonstrated 5.8-second pause, I referred the patient to EP and she was seen by Dr. Rayann Heman on 06/12/2019.  Given the fact that she reported no symptom, she was recommended to continue observation.  Of note, monitor strip was not available to Dr. Rayann Heman at the time of the visit.  She was most recently seen by Dr. Claiborne Billings on 10/01/2019, at which time she did not have any chest pain however blood pressure was elevated.  Patient presents today for cardiology office visit along with her daughter.  She denies any chest pain or shortness of breath.  She brought most of her medication bottles with her today except the amlodipine however she says she is taking amlodipine at home.  Her daughter has also confirmed that she has picked up the amlodipine from the pharmacy before as well.  Her right arm blood pressure was 160/80, left arm blood pressure was 170/84.  I recommended increased amlodipine to 10 mg daily and move it to nighttime.  She will still take the 40 mg daily of lisinopril.  She has a 20 mg lisinopril tablet however she is taking 2 tablet of the lisinopril.  I will see her back in 2 weeks for reassessment and she can see Dr. Claiborne Billings in 2 months.   Past Medical History:  Diagnosis Date  . Allergy    mild   . Cocaine use   . Depression    rx not taking at this time  . Goiter, lymphadenoid  thyroid  . Hx of adenomatous polyp of colon 05/26/2017  . Hyperlipidemia   . Hypertension   . STEMI (ST elevation myocardial infarction) ([place])   . Systolic heart failure [facility])     Past Surgical History:  Procedure Laterality Date  . COLONOSCOPY  04/2017   1 adenoma  . CORONARY STENT INTERVENTION N/A 05/19/2019   Procedure: CORONARY STENT INTERVENTION;  Surgeon: Leonie Man, MD;  Location: [place] [facility];  Service: Cardiovascular;   Laterality: N/A;  . CORONARY/GRAFT ACUTE MI REVASCULARIZATION N/A 05/17/2019   Procedure: Coronary/Graft Acute MI Revascularization;  Surgeon: Troy Sine, MD;  Location: [place] [facility];  Service: Cardiovascular;  Laterality: N/A;  . LEFT HEART CATH AND CORONARY ANGIOGRAPHY N/A 05/19/2019   Procedure: LEFT HEART CATH AND CORONARY ANGIOGRAPHY;  Surgeon: Leonie Man, MD;  Location: [place] [facility];  Service: Cardiovascular;  Laterality: N/A;  . LEFT HEART CATH AND CORONARY ANGIOGRAPHY N/A 05/17/2019   Procedure: LEFT HEART CATH AND CORONARY ANGIOGRAPHY;  Surgeon: Troy Sine, MD;  Location: [place] [facility];  Service: Cardiovascular;  Laterality: N/A;  . THYROIDECTOMY Bilateral 07/08/2015   Procedure: BILATERAL THYROIDECTOMY;  Surgeon: Ruby Cola, MD;  Location[facility];  Service: ENT;  Laterality: Bilateral;    Current Medications: Current Meds  Medication Sig  . amLODipine (NORVASC) 10 MG tablet Take 1 tablet (10 mg total) by mouth daily.  Marland Kitchen aspirin EC 81 MG tablet Take 1 tablet (81 mg total) by mouth daily.  Marland Kitchen atorvastatin (LIPITOR) 80 MG tablet Take 1 tablet (80 mg total) by mouth daily at 6 PM.  . levothyroxine (SYNTHROID) 100 MCG tablet Take 1 tablet (100 mcg total) by mouth daily before breakfast.  . lisinopril (ZESTRIL) 40 MG tablet Take 1 tablet (40 mg total) by mouth daily.  . nitroGLYCERIN (NITROSTAT) 0.4 MG SL tablet Place 1 tablet (0.4 mg total) under the tongue every 5 (five) minutes x 3 doses as needed for chest pain.  . ticagrelor (BRILINTA) 90 MG TABS tablet Take 1 tablet (90 mg total) by mouth 2 (two) times daily.  . [DISCONTINUED] amLODipine (NORVASC) 5 MG tablet Take 1 tablet (5 mg total) by mouth daily.     Allergies:   Cabbage   Social History   Socioeconomic History  . Marital status: Single    Spouse name: Not on file  . Number of children: Not on file  . Years of education: Not on file  . Highest education level: Not on file  Occupational  History  . Not on file  Tobacco Use  . Smoking status: Current Every Day Smoker    Packs/day: 0.25    Types: Cigarettes  . Smokeless tobacco: Never Used  . Tobacco comment: 4-5 cigs a day   Substance and Sexual Activity  . Alcohol use: Yes    Alcohol/week: 0.0 standard drinks    Comment: last occurence 3 weeks ago  . Drug use: No  . Sexual activity: Not on file  Other Topics Concern  . Not on file  Social History Narrative  . Not on file   Social Determinants of Health   Financial Resource Strain:   . Difficulty of Paying Living Expenses: Not on file  Food Insecurity:   . Worried About Charity fundraiser in the Last Year: Not on file  . Ran Out of Food in the Last Year: Not on file  Transportation Needs:   . Lack of Transportation (Medical): Not on file  .  Lack of Transportation (Non-Medical): Not on file  Physical Activity:   . Days of Exercise per Week: Not on file  . Minutes of Exercise per Session: Not on file  Stress:   . Feeling of Stress : Not on file  Social Connections:   . Frequency of Communication with Friends and Family: Not on file  . Frequency of Social Gatherings with Friends and Family: Not on file  . Attends Religious Services: Not on file  . Active Member of Clubs or Organizations: Not on file  . Attends Archivist Meetings: Not on file  . Marital Status: Not on file     Family History: The patient's family history includes Cancer in her father and mother; Hyperlipidemia in her mother. There is no history of Colon cancer, Colon polyps, Esophageal cancer, Rectal cancer, or Stomach cancer.  ROS:   Please see the history of present illness.     All other systems reviewed and are negative.  EKGs/Labs/Other Studies Reviewed:    The following studies were reviewed today: Cath 05/17/2019  Prox RCA lesion is 100% stenosed.  Mid RCA lesion is 55% stenosed.  Prox Cx lesion is 90% stenosed.  Prox LAD to Mid LAD lesion is 30%  stenosed.  Post intervention, there is a 0% residual stenosis.  LV end diastolic pressure is mildly elevated.  A stent was successfully placed.  Acute inferior ST segment elevation myocardial infarction complicated by VF cardiac arrest in the emergency room requiring CPR, defibrillation, and epinephrine with resultant development of atrial fibrillation.  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  Left circumflex vessel has a 90 to 95% eccentric stenosis proximally with TIMI-3 flow.  Total acute occlusion of the proximal RCA with initial TIMI 0 flow.  Successful PCI to the RCA proximal occlusion with ultimate insertion of a 2.25 x 20 mm Synergy DES stent postdilated to 2.5 mm with 100% occlusion being reduced to 0%. There is a 55 to 60% area of diffuse irregularity proximal to and extending into the acute margin with TIMI-3 flow which was not intervened upon.  Transient hypotension probably contributed by RV infarction syndrome which responded to IV fluids and short course of levophed..  Successful cardioversion back to sinus rhythm with reperfusion and following amiodarone therapy.  RECOMMENDATION: DAPT for minimum of 1 year. Once patient is in the CCU, Brilinta will be administered and cangrelor can be discontinued. We will continue bivalirudin until completion of her current second bag. Plan staged PCI to the proximal left circumflex vessel following stabilization. [place] lipid-lowering therapy with high potency statin treatment with target LDL less than 70. Will obtain 2D echo Doppler study to reassess RV and LV function following revascularization. Avoidance of future cocaine.    Echo 05/18/2019 1. Left ventricular ejection fraction, by visual estimation, is 45 to 50%. The left ventricle has normal function. Normal left ventricular size. There is no left ventricular hypertrophy. 2. Basal and mid  inferior wall is abnormal. 3. Elevated mean left atrial pressure. 4. Left ventricular diastolic Doppler parameters are consistent with impaired relaxation pattern of LV diastolic filling. 5. Global right ventricle has normal systolic function.The right ventricular size is mildly enlarged. No increase in right ventricular wall thickness. 6. Left atrial size was severely dilated. 7. Right atrial size was normal. 8. The mitral valve is normal in structure. Trace mitral valve regurgitation. No evidence of mitral stenosis. 9. The tricuspid valve is normal in  structure. Tricuspid valve regurgitation is mild. 10. The aortic valve is normal in structure. Aortic valve regurgitation was not visualized by color flow Doppler. Structurally normal aortic valve, with no evidence of sclerosis or stenosis. 11. The pulmonic valve was normal in structure. Pulmonic valve regurgitation is not visualized by color flow Doppler. 12. Mildly elevated pulmonary artery systolic pressure. 13. The inferior vena cava is normal in size with <50% respiratory variability, suggesting right atrial pressure of 8 mmHg.   Cath 05/19/2019  LESION COMPLEX #1: Prox Cx lesion is 90% stenosed - Prox Cx to Mid Cx lesion is 60% stenosed crosses OM1  A drug-eluting stent was successfully placed covering both lesions (crossing OM1) using a STENT SYNERGY DES 2.25X28 -> postdilated with stent balloon to 2.45 mm  Post intervention, there is a 0% residual stenosis.  -------------------------------------------  LESION #2: Ramus lesion is 80% stenosed.  A drug-eluting stent was successfully placed using a STENT SYNERGY DES 2.25X12 -> postdilated to 2.55 mm  Post intervention, there is a 0% residual stenosis.  -------------------------------------  LV end diastolic pressure is moderately elevated.  SUMMARY  Successful two-vessel PCI: Proximal-mid LCx (Synergy DES 2.25 mm x 28 mm - 2.45 mm), ostial RI (Synergy DES 2.25 mm x 12  mm - 2.55 mm)  Continues to have moderately elevated LVEDP of 21 mm.  RECOMMENDATIONS  Transfer to 6 Central post procedure unit after TR band removal.  Continue aggressive risk modification  Further plans per primary service  EKG:  EKG is not ordered today.   Recent Labs: 04/02/2019: BNP 28.2 10/01/2019: ALT 8; BUN 10; Creatinine, Ser 0.87; Hemoglobin 12.4; Platelets 443; Potassium 5.2; Sodium 141; TSH 24.200  Recent Lipid Panel    Component Value Date/Time   CHOL 180 06/30/2019 1530   TRIG 115 06/30/2019 1530   HDL 82 06/30/2019 1530   CHOLHDL 2.2 06/30/2019 1530   CHOLHDL 3.1 05/18/2019 0112   VLDL 14 05/18/2019 0112   LDLCALC 78 06/30/2019 1530   LDLDIRECT 119 (H) 08/08/2012 1131    Physical Exam:    VS:  BP (!) 170/84 (BP Location: Left Arm, Patient Position: Sitting, Cuff Size: Normal)   Pulse 68   Temp (!) 95 F (35 C)   Ht 5\' 1"  (1.549 m)   Wt 131 lb 3.2 oz (59.5 kg)   SpO2 99%   BMI 24.79 kg/m     Wt Readings from Last 3 Encounters:  10/16/19 131 lb 3.2 oz (59.5 kg)  10/01/19 130 lb 9.6 oz (59.2 kg)  06/30/19 127 lb 12.8 oz (58 kg)     GEN:  Well nourished, well developed in no acute distress HEENT: Normal NECK: No JVD; No carotid bruits LYMPHATICS: No lymphadenopathy CARDIAC: RRR, no murmurs, rubs, gallops RESPIRATORY:  Clear to auscultation without rales, wheezing or rhonchi  ABDOMEN: Soft, non-tender, non-distended MUSCULOSKELETAL:  No edema; No deformity  SKIN: Warm and dry NEUROLOGIC:  Alert and oriented x 3 PSYCHIATRIC:  Normal affect   ASSESSMENT:    1. Coronary artery disease involving native coronary artery of native heart without angina pectoris   2. Accelerated hypertension   3. Hyperlipidemia with target LDL less than 70   4. Second degree AV block    PLAN:    In order of problems listed above:  1. CAD: Denies any recent chest discomfort.  Continue aspirin and Brilinta  2. Uncontrolled hypertension: Blood pressure elevated  today.  Will increase amlodipine to 10 mg at night.  She will continue to take  lisinopril 40 mg in the morning  3. Hyperlipidemia: Continue statin therapy.  4. Second-degree AV block: Previous heart monitor demonstrated sinus pauses up to 5.8 seconds.  He was referred and seen by Dr. Rayann Heman, given lack of symptoms, will continue to monitor.  Sinus pause occurred at night.  Avoid any AV nodal blocking agent.   Medication Adjustments/Labs and Tests Ordered: Current medicines are reviewed at length with the patient today.  Concerns regarding medicines are outlined above.  No orders of the defined types were placed in this encounter.  Meds ordered this encounter  Medications  . amLODipine (NORVASC) 10 MG tablet    Sig: Take 1 tablet (10 mg total) by mouth daily.    Dispense:  90 tablet    Refill:  0    Patient Instructions  Medication Instructions:   INCREASE AMLODIPINE TO 10 MG AT NIGHT   TAKE LISINOPRIL 40 MG IN THE MORNINGS  *If you need a refill on your cardiac medications before your next appointment, please call your pharmacy*  Lab Work: NONE ordered at this time of appointment   If you have labs (blood work) drawn today and your tests are completely normal, you will receive your results only by: Marland Kitchen MyChart Message (if you have MyChart) OR . A paper copy in the mail If you have any lab test that is abnormal or we need to change your treatment, we will call you to review the results.  Testing/Procedures: NONE ordered at this time of appointment   Follow-Up: At [facility], you and your health needs are our priority.  As part of our continuing mission to provide you with exceptional heart care, we have created designated Provider Care Teams.  These Care Teams include your primary Cardiologist (physician) and Advanced Practice Providers (APPs -  Physician Assistants and Nurse Practitioners) who all work together to provide you with the care you need, when you need it.  Your  next appointment:   2 week(s)  The format for your next appointment:   In Person  Provider:   Almyra Deforest, PA-C  Other Instructions  MONITOR and record your blood pressure daily for 2 weeks. Check your blood pressure in the mornings 2 hours after morning medications and check your evening blood pressure at the time of your choosing every time for the next 2 weeks. Bring  Blood pressure log to follow up appointment.      Hilbert Corrigan, [place]  10/18/2019 12:47 AM     [facility]

## 2019-10-16 NOTE — Patient Instructions (Signed)
Medication Instructions:   INCREASE AMLODIPINE TO 10 MG AT NIGHT   TAKE LISINOPRIL 40 MG IN THE MORNINGS  *If you need a refill on your cardiac medications before your next appointment, please call your pharmacy*  Lab Work: NONE ordered at this time of appointment   If you have labs (blood work) drawn today and your tests are completely normal, you will receive your results only by: Marland Kitchen MyChart Message (if you have MyChart) OR . A paper copy in the mail If you have any lab test that is abnormal or we need to change your treatment, we will call you to review the results.  Testing/Procedures: NONE ordered at this time of appointment   Follow-Up: At [facility], you and your health needs are our priority.  As part of our continuing mission to provide you with exceptional heart care, we have created designated Provider Care Teams.  These Care Teams include your primary Cardiologist (physician) and Advanced Practice Providers (APPs -  Physician Assistants and Nurse Practitioners) who all work together to provide you with the care you need, when you need it.  Your next appointment:   2 week(s)  The format for your next appointment:   In Person  Provider:   Almyra Deforest, PA-C  Other Instructions  MONITOR and record your blood pressure daily for 2 weeks. Check your blood pressure in the mornings 2 hours after morning medications and check your evening blood pressure at the time of your choosing every time for the next 2 weeks. Bring  Blood pressure log to follow up appointment.

## 2019-10-18 ENCOUNTER — Encounter: Payer: Self-pay | Admitting: Physician Assistant

## 2019-11-04 ENCOUNTER — Ambulatory Visit: Payer: Medicare Other | Admitting: Physician Assistant

## 2019-11-25 ENCOUNTER — Ambulatory Visit (INDEPENDENT_AMBULATORY_CARE_PROVIDER_SITE_OTHER): Payer: Medicare Other | Admitting: Physician Assistant

## 2019-11-25 ENCOUNTER — Other Ambulatory Visit: Payer: Self-pay

## 2019-11-25 ENCOUNTER — Encounter: Payer: Self-pay | Admitting: Physician Assistant

## 2019-11-25 VITALS — BP 98/72 | HR 80 | Ht 61.0 in | Wt 134.0 lb

## 2019-11-25 DIAGNOSIS — E039 Hypothyroidism, unspecified: Secondary | ICD-10-CM

## 2019-11-25 DIAGNOSIS — E785 Hyperlipidemia, unspecified: Secondary | ICD-10-CM

## 2019-11-25 DIAGNOSIS — I2510 Atherosclerotic heart disease of native coronary artery without angina pectoris: Secondary | ICD-10-CM | POA: Diagnosis not present

## 2019-11-25 DIAGNOSIS — I255 Ischemic cardiomyopathy: Secondary | ICD-10-CM | POA: Diagnosis not present

## 2019-11-25 DIAGNOSIS — I10 Essential (primary) hypertension: Secondary | ICD-10-CM

## 2019-11-25 DIAGNOSIS — I441 Atrioventricular block, second degree: Secondary | ICD-10-CM

## 2019-11-25 MED ORDER — AMLODIPINE BESYLATE 5 MG PO TABS: 5.0000 mg | ORAL_TABLET | Freq: Every day | ORAL | Status: DC

## 2019-11-25 MED ORDER — AMLODIPINE BESYLATE 5 MG PO TABS: 5.0000 mg | ORAL_TABLET | Freq: Every day | ORAL | 1 refills | Status: DC

## 2019-11-25 NOTE — Progress Notes (Signed)
Cardiology Office Note:    Date:  11/27/2019   ID:  Brooke Rivera, DOB January 28, 1961, MRN [id number]  PCP:  Rory Percy, DO  Cardiologist:  Shelva Majestic, MD  Electrophysiologist:  None   Referring MD: Rory Percy, DO   Chief Complaint  Patient presents with  . Follow-up    2 weeks.    History of Present Illness:    Brooke Rivera is a 59 y.o. female with a hx of HTN, HLD,hypothyroidism and substance abuse who presented to the hospital on 05/17/2019 was chest pain and ruled in for STEMI. She has used cocaine the day prior. EKG showed ST elevation in the inferior and anterior leads with reciprocal ST depression along the lateral leads. While in the ED, she had V. fib arrest and underwent CPR. She received 1 shock and 1 dose of epinephrine. Return rhythm was in atrial fibrillation. She was subsequently intubated. Emergent cardiac catheterization performed on 05/17/2019 showed 100% proximal RCA, 55% mid RCA, 90% proximal left circumflex artery, 30% proximal to mid LAD. She underwent successful 2.25 x 20 mm Synergy DES placement to RCA reducing 100% occlusion down to 0%. Staged intervention to proximal left circumflex was recommended following stabilization. Postprocedure, she successfully converted back to sinus rhythm with amiodarone therapy. She had transient hypotension in the Cath Lab contributed to RV infarction syndrome and was treated with IV fluid and a short course of Levophed. Postprocedure, patient was placed on aspirin and Brilinta with recommendation to continue for minimum of 1 year without interruption. Echocardiogram obtained on 05/18/2019 showed EF 45 to 50% mildly elevated pulmonary artery systolic pressure. She returned to the Cath Lab on 05/19/2019 and underwent 2.25 x 28 mm Synergy DES to proximal and mid left circumflex artery. During the hospitalization, she also had a second-degree type II AV block while receiving metoprolol. The longest pause was around 4  seconds. Beta-blocker was stopped and outpatient cardiac heart monitor was recommended.  Postprocedure, patient was discharged on her monitor.  Her monitor demonstrated 5.8-second pause, I referred the patient to EP and she was seen by Dr. Rayann Heman on 06/12/2019.  Given the fact that she reported no symptom, she was recommended to continue observation.  Of note, monitor strip was not available to Dr. Rayann Heman at the time of the visit.  She was most recently seen by Dr. Claiborne Billings on 10/01/2019, at which time she did not have any chest pain however blood pressure was elevated.  I last saw the patient on 10/16/2019, at which time she was doing well without any chest pain or shortness of breath.  Her blood pressure however was elevated in the 160s to 170s.  I decided to increase her amlodipine to 10 mg daily and moved it to nighttime.  She will still be taking her lisinopril in the morning.  Patient presents today for cardiology office visit.  Her blood pressure was borderline low.  Even on repeat blood pressure check, her blood pressure was 102/60.  I recommended to decrease amlodipine back down to 5 mg daily.  Otherwise, she denies any further chest pain or shortness of breath.  Unfortunately her son was recently killed.  She can follow-up with Dr. Claiborne Billings in 3 months.   Past Medical History:  Diagnosis Date  . Allergy    mild   . Cocaine use   . Depression    rx not taking at this time  . Goiter, lymphadenoid    thyroid  . Hx of adenomatous polyp of colon 05/26/2017  .  Hyperlipidemia   . Hypertension   . STEMI (ST elevation myocardial infarction) ([place])   . Systolic heart failure [facility])     Past Surgical History:  Procedure Laterality Date  . COLONOSCOPY  04/2017   1 adenoma  . CORONARY STENT INTERVENTION N/A 05/19/2019   Procedure: CORONARY STENT INTERVENTION;  Surgeon: Leonie Man, MD;  Location: [place] [facility];  Service: Cardiovascular;  Laterality: N/A;  . CORONARY/GRAFT ACUTE MI  REVASCULARIZATION N/A 05/17/2019   Procedure: Coronary/Graft Acute MI Revascularization;  Surgeon: Troy Sine, MD;  Location: [place] [facility];  Service: Cardiovascular;  Laterality: N/A;  . LEFT HEART CATH AND CORONARY ANGIOGRAPHY N/A 05/19/2019   Procedure: LEFT HEART CATH AND CORONARY ANGIOGRAPHY;  Surgeon: Leonie Man, MD;  Location: [place] [facility];  Service: Cardiovascular;  Laterality: N/A;  . LEFT HEART CATH AND CORONARY ANGIOGRAPHY N/A 05/17/2019   Procedure: LEFT HEART CATH AND CORONARY ANGIOGRAPHY;  Surgeon: Troy Sine, MD;  Location: [place] [facility];  Service: Cardiovascular;  Laterality: N/A;  . THYROIDECTOMY Bilateral 07/08/2015   Procedure: BILATERAL THYROIDECTOMY;  Surgeon: Ruby Cola, MD;  [facility];  Service: ENT;  Laterality: Bilateral;    Current Medications: Current Meds  Medication Sig  . albuterol (PROVENTIL HFA;VENTOLIN HFA) 108 (90 Base) MCG/ACT inhaler Inhale 2 puffs into the lungs every 6 (six) hours as needed for wheezing or shortness of breath.  Marland Kitchen amLODipine (NORVASC) 5 MG tablet Take 1 tablet (5 mg total) by mouth daily.  Marland Kitchen aspirin EC 81 MG tablet Take 1 tablet (81 mg total) by mouth daily.  Marland Kitchen atorvastatin (LIPITOR) 80 MG tablet Take 1 tablet (80 mg total) by mouth daily at 6 PM.  . levothyroxine (SYNTHROID) 100 MCG tablet Take 1 tablet (100 mcg total) by mouth daily before breakfast.  . lisinopril (ZESTRIL) 40 MG tablet Take 1 tablet (40 mg total) by mouth daily.  . nitroGLYCERIN (NITROSTAT) 0.4 MG SL tablet Place 1 tablet (0.4 mg total) under the tongue every 5 (five) minutes x 3 doses as needed for chest pain.  . ticagrelor (BRILINTA) 90 MG TABS tablet Take 1 tablet (90 mg total) by mouth 2 (two) times daily.  . [DISCONTINUED] amLODipine (NORVASC) 10 MG tablet Take 1 tablet (10 mg total) by mouth daily.  . [DISCONTINUED] amLODipine (NORVASC) 5 MG tablet Take 1 tablet (5 mg total) by mouth daily.     Allergies:   Cabbage   Social  History   Socioeconomic History  . Marital status: Single    Spouse name: Not on file  . Number of children: Not on file  . Years of education: Not on file  . Highest education level: Not on file  Occupational History  . Not on file  Tobacco Use  . Smoking status: Current Every Day Smoker    Packs/day: 0.25    Types: Cigarettes  . Smokeless tobacco: Never Used  . Tobacco comment: 4-5 cigs a day   Substance and Sexual Activity  . Alcohol use: Yes    Alcohol/week: 0.0 standard drinks    Comment: last occurence 3 weeks ago  . Drug use: No  . Sexual activity: Not on file  Other Topics Concern  . Not on file  Social History Narrative  . Not on file   Social Determinants of Health   Financial Resource Strain:   . Difficulty of Paying Living Expenses:   Food Insecurity:   . Worried About Charity fundraiser in the  Last Year:   . [place] in the Last Year:   Transportation Needs:   . Film/video editor (Medical):   Marland Kitchen Lack of Transportation (Non-Medical):   Physical Activity:   . Days of Exercise per Week:   . Minutes of Exercise per Session:   Stress:   . Feeling of Stress :   Social Connections:   . Frequency of Communication with Friends and Family:   . Frequency of Social Gatherings with Friends and Family:   . Attends Religious Services:   . Active Member of Clubs or Organizations:   . Attends Archivist Meetings:   Marland Kitchen Marital Status:      Family History: The patient's family history includes Cancer in her father and mother; Hyperlipidemia in her mother. There is no history of Colon cancer, Colon polyps, Esophageal cancer, Rectal cancer, or Stomach cancer.  ROS:   Please see the history of present illness.     All other systems reviewed and are negative.  EKGs/Labs/Other Studies Reviewed:    The following studies were reviewed today:  Echo 05/18/2019 1. Left ventricular ejection fraction, by visual estimation, is 45 to  50%. The left  ventricle has normal function. Normal left ventricular size.  There is no left ventricular hypertrophy.  2. Basal and mid inferior wall is abnormal.  3. Elevated mean left atrial pressure.  4. Left ventricular diastolic Doppler parameters are consistent with  impaired relaxation pattern of LV diastolic filling.  5. Global right ventricle has normal systolic function.The right  ventricular size is mildly enlarged. No increase in right ventricular wall  thickness.  6. Left atrial size was severely dilated.  7. Right atrial size was normal.  8. The mitral valve is normal in structure. Trace mitral valve  regurgitation. No evidence of mitral stenosis.  9. The tricuspid valve is normal in structure. Tricuspid valve  regurgitation is mild.  10. The aortic valve is normal in structure. Aortic valve regurgitation  was not visualized by color flow Doppler. Structurally normal aortic  valve, with no evidence of sclerosis or stenosis.  11. The pulmonic valve was normal in structure. Pulmonic valve  regurgitation is not visualized by color flow Doppler.  12. Mildly elevated pulmonary artery systolic pressure.  13. The inferior vena cava is normal in size with <50% respiratory  variability, suggesting right atrial pressure of 8 mmHg.   EKG:  EKG is not ordered today.    Recent Labs: 04/02/2019: BNP 28.2 10/01/2019: ALT 8; BUN 10; Creatinine, Ser 0.87; Hemoglobin 12.4; Platelets 443; Potassium 5.2; Sodium 141; TSH 24.200  Recent Lipid Panel    Component Value Date/Time   CHOL 180 06/30/2019 1530   TRIG 115 06/30/2019 1530   HDL 82 06/30/2019 1530   CHOLHDL 2.2 06/30/2019 1530   CHOLHDL 3.1 05/18/2019 0112   VLDL 14 05/18/2019 0112   LDLCALC 78 06/30/2019 1530   LDLDIRECT 119 (H) 08/08/2012 1131    Physical Exam:    VS:  BP 98/72 (BP Location: Left Arm, Patient Position: Sitting, Cuff Size: Normal)   Pulse 80   Ht 5\' 1"  (1.549 m)   Wt 134 lb (60.8 kg)   SpO2 99%   BMI 25.32  kg/m     Wt Readings from Last 3 Encounters:  11/25/19 134 lb (60.8 kg)  10/16/19 131 lb 3.2 oz (59.5 kg)  10/01/19 130 lb 9.6 oz (59.2 kg)     GEN:  Well nourished, well developed in no acute distress  HEENT: Normal NECK: No JVD; No carotid bruits LYMPHATICS: No lymphadenopathy CARDIAC: RRR, no murmurs, rubs, gallops RESPIRATORY:  Clear to auscultation without rales, wheezing or rhonchi  ABDOMEN: Soft, non-tender, non-distended MUSCULOSKELETAL:  No edema; No deformity  SKIN: Warm and dry NEUROLOGIC:  Alert and oriented x 3 PSYCHIATRIC:  Normal affect   ASSESSMENT:    1. Coronary artery disease involving native coronary artery of native heart without angina pectoris   2. Hypertension, benign   3. Hyperlipidemia with target LDL less than 70   4. Hypothyroidism, unspecified type   5. Second degree AV block    PLAN:    In order of problems listed above:  1. CAD: Denies any chest pain.  Continue on aspirin, Brilinta and Lipitor  2. Hypertension: Blood pressure is actually borderline low today.  Will reduce amlodipine back down to 5 mg daily  3. Hyperlipidemia: Continue Lipitor  4. Hypothyroidism: On Synthroid  5. Second-degree AV block: Occurred in the setting of metoprolol.  Avoid any AV nodal blocking agent in this patient.   Medication Adjustments/Labs and Tests Ordered: Current medicines are reviewed at length with the patient today.  Concerns regarding medicines are outlined above.  No orders of the defined types were placed in this encounter.  Meds ordered this encounter  Medications  . DISCONTD: amLODipine (NORVASC) 5 MG tablet    Sig: Take 1 tablet (5 mg total) by mouth daily.    Dispense:  90 tablet  . amLODipine (NORVASC) 5 MG tablet    Sig: Take 1 tablet (5 mg total) by mouth daily.    Dispense:  90 tablet    Refill:  1    Patient Instructions  Medication Instructions:   DECREASE Amlodipine to 5 mg   *If you need a refill on your cardiac  medications before your next appointment, please call your pharmacy*  Lab Work: NONE ordered at this time of appointment   If you have labs (blood work) drawn today and your tests are completely normal, you will receive your results only by: Marland Kitchen MyChart Message (if you have MyChart) OR . A paper copy in the mail If you have any lab test that is abnormal or we need to change your treatment, we will call you to review the results.  Testing/Procedures: NONE ordered at this time of appointment   Follow-Up: At [facility], you and your health needs are our priority.  As part of our continuing mission to provide you with exceptional heart care, we have created designated Provider Care Teams.  These Care Teams include your primary Cardiologist (physician) and Advanced Practice Providers (APPs -  Physician Assistants and Nurse Practitioners) who all work together to provide you with the care you need, when you need it.  We recommend signing up for the patient portal called "MyChart".  Sign up information is provided on this After Visit Summary.  MyChart is used to connect with patients for Virtual Visits (Telemedicine).  Patients are able to view lab/test results, encounter notes, upcoming appointments, etc.  Non-urgent messages can be sent to your provider as well.   To learn more about what you can do with MyChart, go to [email or web address].    Your next appointment:   3 month(s)  The format for your next appointment:   In Person  Provider:   Shelva Majestic, MD  Other Instructions      Signed, Almyra Deforest, [place]  11/27/2019 11:39 PM    [place]

## 2019-11-25 NOTE — Patient Instructions (Addendum)
Medication Instructions:   DECREASE Amlodipine to 5 mg   *If you need a refill on your cardiac medications before your next appointment, please call your pharmacy*  Lab Work: NONE ordered at this time of appointment   If you have labs (blood work) drawn today and your tests are completely normal, you will receive your results only by: Marland Kitchen MyChart Message (if you have MyChart) OR . A paper copy in the mail If you have any lab test that is abnormal or we need to change your treatment, we will call you to review the results.  Testing/Procedures: NONE ordered at this time of appointment   Follow-Up: At [facility], you and your health needs are our priority.  As part of our continuing mission to provide you with exceptional heart care, we have created designated Provider Care Teams.  These Care Teams include your primary Cardiologist (physician) and Advanced Practice Providers (APPs -  Physician Assistants and Nurse Practitioners) who all work together to provide you with the care you need, when you need it.  We recommend signing up for the patient portal called "MyChart".  Sign up information is provided on this After Visit Summary.  MyChart is used to connect with patients for Virtual Visits (Telemedicine).  Patients are able to view lab/test results, encounter notes, upcoming appointments, etc.  Non-urgent messages can be sent to your provider as well.   To learn more about what you can do with MyChart, go to [email or web address].    Your next appointment:   3 month(s)  The format for your next appointment:   In Person  Provider:   Shelva Majestic, MD  Other Instructions

## 2019-11-30 ENCOUNTER — Telehealth: Payer: Self-pay | Admitting: Cardiovascular Disease

## 2019-11-30 ENCOUNTER — Other Ambulatory Visit: Payer: Self-pay

## 2019-11-30 MED ORDER — LISINOPRIL 40 MG PO TABS: 40.0000 mg | ORAL_TABLET | Freq: Every day | ORAL | 3 refills | Status: DC

## 2019-11-30 MED ORDER — TICAGRELOR 90 MG PO TABS: 90.0000 mg | ORAL_TABLET | Freq: Two times a day (BID) | ORAL | 2 refills | Status: DC

## 2019-11-30 NOTE — Telephone Encounter (Signed)
Contacted patient, advised that 5 mg were sent to pharmacy- and for her to pick those up to take. Patient also needing refills on other medications, sent those to pharmacy as well. Patient verbalized understanding

## 2019-11-30 NOTE — Telephone Encounter (Signed)
Pt c/o medication issue:   1. Name of Medication:  amLODIPine Besylate   2. How are you currently taking this medication (dosage and times per day)? 1 tablet (5 mg total) by mouth daily  3. Are you having a reaction (difficulty breathing--STAT)? No   4. What is your medication issue?  Patient states that during appointment completed on 11/25/19 she was advised by Almyra Deforest to alter amLODIPine Besylate medication dosage from taking 10 mg to taking 5 mg by mouth daily. However, the tablets she was originally prescribed are 20 mg tablets, so she is not able to break tablet into 4 equal parts. Please assist.

## 2019-12-09 ENCOUNTER — Ambulatory Visit: Payer: Medicare Other | Admitting: Cardiovascular Disease

## 2019-12-15 ENCOUNTER — Telehealth: Payer: Self-pay | Admitting: Cardiovascular Disease

## 2019-12-15 NOTE — Telephone Encounter (Signed)
Left message for patient to call and schedule 3 month follow up appointment with Dr. Claiborne Billings

## 2020-03-15 ENCOUNTER — Ambulatory Visit: Payer: Medicare Other | Admitting: Cardiovascular Disease

## 2020-03-22 NOTE — Patient Instructions (Addendum)
It was nice meeting you  today!  I have sent a referral to help complete forms for home assistance.  Please follow up next week and bring in all your medications.  We can discuss your blood work then.   If you have any questions or concerns, please feel free to call the clinic.   Be well,  Carollee Leitz, MD [facility] Medicine Res[facility]

## 2020-03-22 NOTE — Progress Notes (Signed)
    SUBJECTIVE:   CHIEF COMPLAINT / HPI: requesting refills and at home assistance  Hypertension: - Medications: Lisinopril, Amlodipine, Atorvastatin - Compliance: yes - Checking BP at home: no - Denies any SOB, CP, vision changes, LE edema, medication SEs, or symptoms of hypotension  S/PThyroidectomy No fatigue, changes in hair/nails, hot/cold intolerance, or unintended weight changes. Currently taking Levothyroxine 100mg  daily.  Last TSH 24.2 (02/21).   PERTINENT  PMH / PSH:  Hypothyroid STEMI involving RCA  Learning disability  OBJECTIVE:   BP 128/70   Pulse 60   Ht 5\' 1"  (1.549 m)   Wt 133 lb (60.3 kg)   SpO2 96%   BMI 25.13 kg/m    General: Alert and oriented, no apparent distress  Neck - No masses or thyromegaly or limitation in range of motion Cardiovascular: RRR with no murmurs noted Respiratory: CTA bilaterally    ASSESSMENT/PLAN:   Hypothyroidism Currently having no symptoms.  Last TSH elevated and Levoyroxine increased to 175mcg -repeat TSH today -Refill Levothyroxine 100 mg -Will notify patient of results and if need to adjust medication -Follow up 1 week  Hypertension, benign Normotensive. -Continue current mediations -Advise patient to bring in all medications at next visit  -Follow up 1 weeks  Assistance needed at home Patient having difficulty at home requesting aid.  Reports learning disability and living by self.  Has had falls in the past. -Refer CCM -Will complete forms and notify patient when done -Follow up as needed     Carollee Leitz, MD [place]

## 2020-03-23 ENCOUNTER — Other Ambulatory Visit: Payer: Self-pay

## 2020-03-23 ENCOUNTER — Encounter: Payer: Self-pay | Admitting: Family Medicine

## 2020-03-23 ENCOUNTER — Ambulatory Visit (INDEPENDENT_AMBULATORY_CARE_PROVIDER_SITE_OTHER): Payer: Medicare Other | Admitting: Family Medicine

## 2020-03-23 VITALS — BP 128/70 | HR 60 | Ht 61.0 in | Wt 133.0 lb

## 2020-03-23 DIAGNOSIS — I10 Essential (primary) hypertension: Secondary | ICD-10-CM | POA: Diagnosis not present

## 2020-03-23 DIAGNOSIS — I255 Ischemic cardiomyopathy: Secondary | ICD-10-CM | POA: Diagnosis not present

## 2020-03-23 DIAGNOSIS — Z742 Need for assistance at home and no other household member able to render care: Secondary | ICD-10-CM | POA: Diagnosis not present

## 2020-03-23 DIAGNOSIS — E039 Hypothyroidism, unspecified: Secondary | ICD-10-CM | POA: Diagnosis not present

## 2020-03-23 MED ORDER — ATORVASTATIN CALCIUM 80 MG PO TABS: 80.0000 mg | ORAL_TABLET | Freq: Every day | ORAL | 1 refills | Status: DC

## 2020-03-23 MED ORDER — ACETAMINOPHEN ER 650 MG PO TBCR
650.0000 mg | EXTENDED_RELEASE_TABLET | Freq: Three times a day (TID) | ORAL | 1 refills | Status: AC | PRN
Start: 2020-03-23 — End: 2020-04-22

## 2020-03-23 MED ORDER — LISINOPRIL 40 MG PO TABS: 40.0000 mg | ORAL_TABLET | Freq: Every day | ORAL | 3 refills | Status: DC

## 2020-03-23 MED ORDER — LEVOTHYROXINE SODIUM 100 MCG PO TABS: 100.0000 ug | ORAL_TABLET | Freq: Every day | ORAL | 0 refills | Status: DC

## 2020-03-23 MED ORDER — AMLODIPINE BESYLATE 5 MG PO TABS: 5.0000 mg | ORAL_TABLET | Freq: Every day | ORAL | 1 refills | Status: DC

## 2020-03-24 ENCOUNTER — Telehealth: Payer: Self-pay | Admitting: *Deleted

## 2020-03-24 LAB — BASIC METABOLIC PANEL
BUN/Creatinine Ratio: 13 (ref 12–28)
BUN: 13 mg/dL (ref 8–27)
CO2: 23 mmol/L (ref 20–29)
Calcium: 9.3 mg/dL (ref 8.7–10.3)
Chloride: 102 mmol/L (ref 96–106)
Creatinine, Ser: 1.02 mg/dL — ABNORMAL HIGH (ref 0.57–1.00)
GFR calc Af Amer: 69 mL/min/{1.73_m2} (ref >59)
GFR calc non Af Amer: 60 mL/min/{1.73_m2} (ref >59)
Glucose: 83 mg/dL (ref 65–99)
Potassium: 4.4 mmol/L (ref 3.5–5.2)
Sodium: 140 mmol/L (ref 134–144)

## 2020-03-24 LAB — TSH: TSH: 69.1 u[IU]/mL — ABNORMAL HIGH (ref 0.450–4.500)

## 2020-03-24 NOTE — Chronic Care Management (AMB) (Signed)
  Chronic Care Management   Note  03/24/2020 Name: Brooke Rivera MRN: [id number] DOB: August 16, 1961  [name] is a 59 y.o. year old female who is a primary care patient of Carollee Leitz, MD. I reached out to [name] by phone today in response to a referral sent by [name]'s PCP, Carollee Leitz, MD.     [name] was given information about Chronic Care Management services today including:  1. CCM service includes personalized support from designated clinical staff supervised by her physician, including individualized plan of care and coordination with other care providers 2. 24/7 contact phone numbers for assistance for urgent and routine care needs. 3. Service will only be billed when office clinical staff spend 20 minutes or more in a month to coordinate care. 4. Only one practitioner may furnish and bill the service in a calendar month. 5. The patient may stop CCM services at any time (effective at the end of the month) by phone call to the office staff. 6. The patient will be responsible for cost sharing (co-pay) of up to 20% of the service fee (after annual deductible is met).  Patient agreed to services and verbal consent obtained.   Follow up plan: Telephone appointment with care management team member scheduled for: 03/25/2020  [facility]  [place], [place] [address] Direct Dial: [email or web address] Website: [email or web address]

## 2020-03-25 ENCOUNTER — Other Ambulatory Visit: Payer: Self-pay

## 2020-03-25 ENCOUNTER — Ambulatory Visit: Payer: Medicare Other | Admitting: Licensed Clinical Social Worker

## 2020-03-25 ENCOUNTER — Encounter: Payer: Self-pay | Admitting: Family Medicine

## 2020-03-25 DIAGNOSIS — Z139 Encounter for screening, unspecified: Secondary | ICD-10-CM

## 2020-03-25 DIAGNOSIS — Z742 Need for assistance at home and no other household member able to render care: Secondary | ICD-10-CM

## 2020-03-25 DIAGNOSIS — Z741 Need for assistance with personal care: Secondary | ICD-10-CM

## 2020-03-25 HISTORY — DX: Need for assistance at home and no other household member able to render care: Z74.2

## 2020-03-25 NOTE — Chronic Care Management (AMB) (Signed)
Care Management   Clinical Social Work initial Note  03/25/2020 Name: Brooke Rivera MRN: [id number] DOB: [birth year] Brooke Rivera is a 59 y.o. year old female who sees Brooke Leitz, MD for primary care. The Care Management team was consulted by PCP to assist the patient with Level of Care Concerns. Disease Management and Medication Management/ Education.  LCSW reached out to Brooke Rivera today by phone to introduce self, assess needs and barriers to care.   Assessment: Patient is pleasant and engaged in conversation.  Experiencing difficulty with meeting ADL's as well as compliance with medication.  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   See care plan before for details of goals       Plan:  1. CCM RN has phone appointment with patient 03/28/20 2.  LCSW will F/U with patient in 2 weeks  Review of patient status, including review of consultants reports, relevant laboratory and other test results, and collaboration with appropriate care team members and the patient's provider was performed as part of comprehensive patient evaluation and provision of chronic care management services.    Advance Directive Status:   Not addressed in this encounter SDOH (Social Determinants of Health) assessments performed: Yes: No needs identified  SDOH Interventions     Most Recent Value  SDOH Interventions  Food Insecurity Interventions Intervention Not Indicated      ; Goals Addressed            This Visit's Progress   . personal care services       CARE PLAN ENTRY (see longitudinal plan of care for additional care plan information)  Current Barriers:   . Patient with chronic medical condition needs Support, Education, and Care Coordination to resolve unmet Personal Care needs . Family was helping patient but no longer able to assist, patient lives alone and needing help with ADL's . Patient needs refill on inhaler  and concern with reading medication and how to take it . Patient needs PCP to complete PCS Referral Clinical Social Work Goal(s):  Marland Kitchen Over the next 30 to 45 days, patient will have personal care needs met as evident by having PCS Aide in the home assisting with needs.  Interventions provided by LCSW : . Assessed needs, level of care concerns, basic eligibility and provided education on Personal Care Service process, . Mailed list of PCS providers, and educational information on PCS services  . Collaborate with primary care provider ref completing PCS referral ( will place Referral in PCP's mailbox 03/29/2020 ) . PCS referral will be faxed to Brooke Rivera at [phone] once completed and signed by PCP . LCSW will collaborate with Brooke Rivera to verify application is received and processed.  . Collaboration with CCM RN - referral for CCM RN to assist with managing chronic medical conditions and medication . Collaborated with [facility] RN pool to inform them of patient's request for refill on her inhaler  Patient Self Care Activities & Deficits:  . Patient is unable to perform ADLs independently without assistance  . Family/support system will assist patient with meeting needs until [facility] is approved . Return calls from [facility] to set up initial PCS assessment once application is approved . Call [facility] with questions [phone] or [phone]  Initial goal documentation       Outpatient Encounter Medications as of 03/25/2020  Medication Sig  . acetaminophen (TYLENOL) 650 MG CR tablet Take  1 tablet (650 mg total) by mouth every 8 (eight) hours as needed for pain.  Marland Kitchen albuterol (PROVENTIL HFA;VENTOLIN HFA) 108 (90 Base) MCG/ACT inhaler Inhale 2 puffs into the lungs every 6 (six) hours as needed for wheezing or shortness of breath.  Marland Kitchen amLODipine (NORVASC) 5 MG tablet Take 1 tablet (5 mg total) by mouth daily.  Marland Kitchen aspirin EC 81 MG tablet Take 1 tablet (81  mg total) by mouth daily.  Marland Kitchen atorvastatin (LIPITOR) 80 MG tablet Take 1 tablet (80 mg total) by mouth daily at 6 PM.  . levothyroxine (SYNTHROID) 100 MCG tablet Take 1 tablet (100 mcg total) by mouth daily before breakfast.  . lisinopril (ZESTRIL) 40 MG tablet Take 1 tablet (40 mg total) by mouth daily.  . nitroGLYCERIN (NITROSTAT) 0.4 MG SL tablet Place 1 tablet (0.4 mg total) under the tongue every 5 (five) minutes x 3 doses as needed for chest pain.  . ticagrelor (BRILINTA) 90 MG TABS tablet Take 1 tablet (90 mg total) by mouth 2 (two) times daily.   No facility-administered encounter medications on file as of 03/25/2020.       Information about Care Management services was shared with [name] today including:  1. Care Management services include personalized support from designated clinical staff supervised by her physician, including individualized plan of care and coordination with other care providers 2. Remind patient of 24/7 contact phone numbers to provider's office for assistance with urgent and routine care needs. 3. Care Management services are voluntary and patient may stop at any time .  Patient agreed to services provided today and verbal consent obtained.     Casimer Lanius, [place] / [place]   [phone] 10:24 AM

## 2020-03-25 NOTE — Assessment & Plan Note (Signed)
Normotensive. -Continue current mediations -Advise patient to bring in all medications at next visit  -Follow up 1 weeks

## 2020-03-25 NOTE — Assessment & Plan Note (Signed)
Patient having difficulty at home requesting aid.  Reports learning disability and living by self.  Has had falls in the past. -Refer CCM -Will complete forms and notify patient when done -Follow up as needed

## 2020-03-25 NOTE — Assessment & Plan Note (Signed)
Currently having no symptoms.  Last TSH elevated and Levoyroxine increased to 128mcg -repeat TSH today -Refill Levothyroxine 100 mg -Will notify patient of results and if need to adjust medication -Follow up 1 week

## 2020-03-28 ENCOUNTER — Other Ambulatory Visit: Payer: Self-pay

## 2020-03-28 ENCOUNTER — Ambulatory Visit: Payer: Medicare Other

## 2020-03-28 DIAGNOSIS — Z789 Other specified health status: Secondary | ICD-10-CM

## 2020-03-28 DIAGNOSIS — Z139 Encounter for screening, unspecified: Secondary | ICD-10-CM

## 2020-03-29 ENCOUNTER — Telehealth: Payer: Self-pay | Admitting: Family Medicine

## 2020-03-29 NOTE — Telephone Encounter (Signed)
Telephone call to patient to discuss TSH levels at 69.1.  Patient reports has been out of medications for 2 weeks prior to labs taken.  I reordered her Levothyroxine on 07/28 and she reports that she has not resumed her medications as she has no way of getting them.  From my understanding of what she tells me, her  pharmacy is trying to mail them out to her.  I discussed the importance of her getting her medication to regulate her thyroid. Patient verbalizes understanding.  I called the pharmacy and patient will have to set up mail services.   Carollee Leitz, MD Family Medicine Residency

## 2020-03-30 NOTE — Chronic Care Management (AMB) (Signed)
Care Management   Initial Visit Note  03/30/2020 Name: Brooke Rivera MRN: [id number] DOB: Jan 25, 1961   Assessment: Brooke Rivera is a 59 y.o. year old female who sees Carollee Leitz, MD for primary care. The care management team was consulted for assistance with care management and care coordination needs related to Care Coordination for patient with learning disability with Acute Diastolic Heart Failure, Hypertension and Atrial Fibrilation  .   Review of patient status, including review of consultants reports, relevant laboratory and other test results, and collaboration with appropriate care team members and the patient's provider was performed as part of comprehensive patient evaluation and provision of care management services.    SDOH (Social Determinants of Health) assessments performed: No See Care Plan activities for detailed interventions related to [facility])     Outpatient Encounter Medications as of 03/28/2020  Medication Sig  . amLODipine (NORVASC) 5 MG tablet Take 1 tablet (5 mg total) by mouth daily.  Marland Kitchen acetaminophen (TYLENOL) 650 MG CR tablet Take 1 tablet (650 mg total) by mouth every 8 (eight) hours as needed for pain.  Marland Kitchen albuterol (PROVENTIL HFA;VENTOLIN HFA) 108 (90 Base) MCG/ACT inhaler Inhale 2 puffs into the lungs every 6 (six) hours as needed for wheezing or shortness of breath.  Marland Kitchen aspirin EC 81 MG tablet Take 1 tablet (81 mg total) by mouth daily.  Marland Kitchen atorvastatin (LIPITOR) 80 MG tablet Take 1 tablet (80 mg total) by mouth daily at 6 PM.  . levothyroxine (SYNTHROID) 100 MCG tablet Take 1 tablet (100 mcg total) by mouth daily before breakfast.  . lisinopril (ZESTRIL) 40 MG tablet Take 1 tablet (40 mg total) by mouth daily.  . nitroGLYCERIN (NITROSTAT) 0.4 MG SL tablet Place 1 tablet (0.4 mg total) under the tongue every 5 (five) minutes x 3 doses as needed for chest pain.  . ticagrelor (BRILINTA) 90 MG TABS tablet Take 1 tablet (90 mg total) by mouth 2 (two) times daily.    No facility-administered encounter medications on file as of 03/28/2020.    Goals Addressed              This Visit's Progress   .  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 Care Coordination needs related to Medications in a patient with Acute Systolic Heart Failure, Hypertension, Atrial Fibrillation  . Chronic Disease Management support and education needs related to Medications in a patient with Acute Systolic Heart Failure, Hypertension, Atrial Fibrillation  . Cognitive Deficits  Nurse Case Manager Clinical Goal(s):  Marland Kitchen Over the next 30-45 days, patient will work with [facility] to address needs related to medication  Interventions:  . Inter-disciplinary care team collaboration (see longitudinal plan of care) . Discussed plans with patient for ongoing care management follow up and provided patient with direct contact information for care management team . Care Guide referral for for transportation needs for medicaid  Patient Self Care Activities:  . Patient verbalizes understanding of plan to work with pharmacy to have medication changed to pillpacks . Attends all scheduled provider appointments . Calls provider office for new concerns or questions . Unable to independently self manage medication needs  Initial goal documentation         Follow up plan:  The care management team will reach out to the patient again over the next 14 days.   Ms. Garrow was given information about  Care Management services today including:  1. Care Management services include personalized support from designated clinical staff supervised by a physician, including individualized plan of care and coordination with other care providers 2. 24/7 contact phone numbers for assistance for urgent and routine care needs. 3. The patient may stop Care Management services at any time (effective  at the end of the month) by phone call to the office staff.  Patient agreed to services and verbal consent obtained.  [name] RN, BSN, [facility] Care Management Coordinator [place] Phone: [phone] Fax: [phone]

## 2020-03-30 NOTE — Patient Instructions (Signed)
Visit Information  Goals Addressed              This Visit's Progress   .  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 Care Coordination needs related to Medications in a patient with Acute Systolic Heart Failure, Hypertension, Atrial Fibrillation  . Chronic Disease Management support and education needs related to Medications in a patient with Acute Systolic Heart Failure, Hypertension, Atrial Fibrillation  . Cognitive Deficits  Nurse Case Manager Clinical Goal(s):  Marland Kitchen Over the next 30-45 days, patient will work with [facility] to address needs related to medication  Interventions:  . Inter-disciplinary care team collaboration (see longitudinal plan of care) . Discussed plans with patient for ongoing care management follow up and provided patient with direct contact information for care management team . Care Guide referral for for transportation needs for medicaid  Patient Self Care Activities:  . Patient verbalizes understanding of plan to work with pharmacy to have medication changed to pillpacks . Attends all scheduled provider appointments . Calls provider office for new concerns or questions . Unable to independently self manage medication needs  Initial goal documentation        Brooke Rivera was given information about Care Management services today including:  1. Care Management services include personalized support from designated clinical staff supervised by her physician, including individualized plan of care and coordination with other care providers 2. 24/7 contact phone numbers for assistance for urgent and routine care needs. 3. The patient may stop CCM services at any time (effective at the end of the month) by phone call to the office staff.  Patient agreed to services and verbal consent obtained.   The patient verbalized understanding of instructions  provided today and declined a print copy of patient instruction materials.   The care management team will reach out to the patient again over the next 14 days.  The patient has been provided with contact information for the care management team and has been advised to call with any health related questions or concerns.   [name] RN, BSN, [facility] Care Management Coordinator [place] Phone: [phone] Fax: [phone]

## 2020-04-04 ENCOUNTER — Other Ambulatory Visit: Payer: Self-pay

## 2020-04-04 ENCOUNTER — Ambulatory Visit: Payer: Medicare Other

## 2020-04-04 NOTE — Chronic Care Management (AMB) (Signed)
Care Management   Follow Up Note   04/04/2020 Name: Brooke Rivera MRN: [id number] DOB: March 23, 1961  Referred by: Carollee Leitz, MD Reason for referral : Appointment (Medication)   Brooke Rivera is a 59 y.o. year old female who is a primary care patient of Carollee Leitz, MD. The care management team was consulted for assistance with care management and care coordination needs.    Review of patient status, including review of consultants reports, relevant laboratory and other test results, and collaboration with appropriate care team members and the patient's provider was performed as part of comprehensive patient evaluation and provision of chronic care management services.    SDOH (Social Determinants of Health) assessments performed: No See Care Plan activities for detailed interventions related to [facility])     Advanced Directives: See Care Plan and Vynca application for related entries.   Goals Addressed              This Visit's Progress   .  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 Care Coordination needs related to Medications in a patient with Acute Systolic Heart Failure, Hypertension, Atrial Fibrillation  . Chronic Disease Management support and education needs related to Medications in a patient with Acute Systolic Heart Failure, Hypertension, Atrial Fibrillation  . Cognitive Deficits  Nurse Case Manager Clinical Goal(s):  Marland Kitchen Over the next 30-45 days, patient Rivera work with [facility] to address needs related to medication  Interventions:  . Inter-disciplinary care team collaboration (see longitudinal plan of care) . Discussed plans with patient for ongoing care management follow up and provided patient with direct contact information for care management team . Care Guide referral for for transportation needs for medicaid . 04/04/20 . Patient states that she has  a hard time reading the bottles of her medications and she does not know what she is taking. Discussed with the patient about using pill packs and having them mailed to her since she also has a hard time with transportation to the pharmacy.  The patient stated that she would like that. Marland Kitchen RNCM called [employer] at [phone] and spoke with Mitzi Hansen they Rivera have the patient's medications put in blister packs and mailed to her home. . Explained the information to th patient.  I asked the patient when she gets her first package of medications to call me and we can make an appointment for her to come into the office for [place] to go over them. . We discussed her medications that she has not and the barriers that she faces.  She told me dosages of the medications and I spelled out the names and told her what they where for.  She marked the medications with a symbol to help her remember what the medications are for.    Patient Self Care Activities:  . Patient verbalizes understanding of plan to work with pharmacy to have medication changed to pillpacks . Attends all scheduled provider appointments . Calls provider office for new concerns or questions . Unable to independently self manage medication needs  Please see past updates related to this goal by clicking on the "Past Updates" button in the selected goal          The care management team Rivera reach out to the patient again over the next 14 days.   [name] RN, BSN, [facility] Care Management Coordinator [place] Phone: [phone] Fax: [phone]

## 2020-04-04 NOTE — Patient Instructions (Signed)
Visit Information  Goals Addressed              This Visit's Progress   .  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 Care Coordination needs related to Medications in a patient with Acute Systolic Heart Failure, Hypertension, Atrial Fibrillation  . Chronic Disease Management support and education needs related to Medications in a patient with Acute Systolic Heart Failure, Hypertension, Atrial Fibrillation  . Cognitive Deficits  Nurse Case Manager Clinical Goal(s):  Marland Kitchen Over the next 30-45 days, patient will work with [facility] to address needs related to medication  Interventions:  . Inter-disciplinary care team collaboration (see longitudinal plan of care) . Discussed plans with patient for ongoing care management follow up and provided patient with direct contact information for care management team . Care Guide referral for for transportation needs for medicaid . 04/04/20 . Patient states that she has a hard time reading the bottles of her medications and she does not know what she is taking. Discussed with the patient about using pill packs and having them mailed to her since she also has a hard time with transportation to the pharmacy.  The patient stated that she would like that. Marland Kitchen RNCM called [employer] at [phone] and spoke with Mitzi Hansen they will have the patient's medications put in blister packs and mailed to her home. . Explained the information to th patient.  I asked the patient when she gets her first package of medications to call me and we can make an appointment for her to come into the office for [place] to go over them. . We discussed her medications that she has not and the barriers that she faces.  She told me dosages of the medications and I spelled out the names and told her what they where for.  She marked the medications with a symbol to help her  remember what the medications are for.    Patient Self Care Activities:  . Patient verbalizes understanding of plan to work with pharmacy to have medication changed to pillpacks . Attends all scheduled provider appointments . Calls provider office for new concerns or questions . Unable to independently self manage medication needs  Please see past updates related to this goal by clicking on the "Past Updates" button in the selected goal         Ms. Matusek was given information about Care Management services today including:  1. Care Management services include personalized support from designated clinical staff supervised by her physician, including individualized plan of care and coordination with other care providers 2. 24/7 contact phone numbers for assistance for urgent and routine care needs. 3. The patient may stop CCM services at any time (effective at the end of the month) by phone call to the office staff.  Patient agreed to services and verbal consent obtained.   The patient verbalized understanding of instructions provided today and declined a print copy of patient instruction materials.   The care management team will reach out to the patient again over the next 14 days.    [name] RN, BSN, [facility] Care Management Coordinator [place] Phone: [phone] Fax: [phone]

## 2020-04-07 ENCOUNTER — Ambulatory Visit: Payer: Self-pay | Admitting: Licensed Clinical Social Worker

## 2020-04-07 DIAGNOSIS — Z741 Need for assistance with personal care: Secondary | ICD-10-CM

## 2020-04-07 NOTE — Chronic Care Management (AMB) (Signed)
Social Work  Care Management Collaboration 04/07/2020 Name: Brooke Rivera MRN: [id number] DOB: 08-03-1961 Brooke Rivera is a 59 y.o. year old female who sees Carollee Leitz, MD for primary care. LCSW was consulted by PCP to assistance patient with  Level of Care Concerns for [place].   Intervention: Patient was not interviewed or contacted during this encounter.  LCSW collaborated with PCP, RN, reviewed PCS form and faxed to [place]. Plan:   1. RN care manager will follow up with the patient for ongoing needs.    2. LCSW will F/U on PCS in 5 to 7 days.    Review of patient status, including review of consultants reports, relevant laboratory and other test results, and collaboration with appropriate care team members and the patient's provider was performed as part of comprehensive patient evaluation and provision of chronic care management services.    Goals Addressed            This Visit's Progress    personal care services   On track    [place] (see longitudinal plan of care for additional care plan information)  Current Barriers:    Patient with chronic medical condition needs Support, Education, and Care Coordination to resolve unmet Personal Care needs  Family was helping patient but no longer able to assist, patient lives alone and needing help with ADL's  Patient needs refill on inhaler and concern with reading medication and how to take it  PCS Referral completed and faxed to [place] Work Goal(s):   Over the next 30 to 45 days, patient will have personal care needs met as evident by having PCS Aide in the home assisting with needs.  Interventions provided by LCSW :  Assessed needs, level of care concerns, basic eligibility and provided education on Personal Care Service process,  Mailed list of PCS providers, and educational information on PCS services   Collaborate with primary care provider ref completing PCS  referral   PCS referral faxed to [employer] at [phone]   LCSW will collaborate with [facility] to verify application is received and processed.   Collaboration with CCM RN - referral for CCM RN to assist with managing chronic medical conditions and medication Patient Self Care Activities & Deficits:   Patient is unable to perform ADLs independently without assistance   Family/support system will assist patient with meeting needs until PCS is approved  Return calls from [facility] to set up initial PCS assessment once application is approved  Call [facility] with questions [phone] or [phone]  Please see past updates related to this goal by clicking on the "Past Updates" button in the selected goal      Casimer Lanius, [place] / [place]   [phone] 9:21 AM

## 2020-04-11 ENCOUNTER — Ambulatory Visit: Payer: Medicare Other

## 2020-04-11 ENCOUNTER — Ambulatory Visit (INDEPENDENT_AMBULATORY_CARE_PROVIDER_SITE_OTHER): Payer: Medicare Other | Admitting: Family Medicine

## 2020-04-11 ENCOUNTER — Other Ambulatory Visit: Payer: Self-pay

## 2020-04-11 ENCOUNTER — Encounter: Payer: Self-pay | Admitting: Family Medicine

## 2020-04-11 VITALS — BP 110/72 | HR 82 | Wt 133.0 lb

## 2020-04-11 DIAGNOSIS — I10 Essential (primary) hypertension: Secondary | ICD-10-CM | POA: Diagnosis not present

## 2020-04-11 DIAGNOSIS — E039 Hypothyroidism, unspecified: Secondary | ICD-10-CM

## 2020-04-11 DIAGNOSIS — I255 Ischemic cardiomyopathy: Secondary | ICD-10-CM

## 2020-04-11 NOTE — Patient Instructions (Signed)
Thank you for coming to see me today. It was a pleasure.   Your Blood pressure is good.  Continue to take your medications as we had discussed.  When you receive your nest medications in the blister packs please call Ms Olivia Mackie to come to clinic and review how to use them  Please follow-up with PCP in 6 weeks  If you have any questions or concerns, please do not hesitate to call the office at [phone].  Best,   Carollee Leitz, MD Family Medicine Residency

## 2020-04-11 NOTE — Patient Instructions (Signed)
Visit Information  Goals Addressed              This Visit's Progress   .  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 Care Coordination needs related to Medications in a patient with Acute Systolic Heart Failure, Hypertension, Atrial Fibrillation  . Chronic Disease Management support and education needs related to Medications in a patient with Acute Systolic Heart Failure, Hypertension, Atrial Fibrillation  . Cognitive Deficits  Nurse Case Manager Clinical Goal(s):  Marland Kitchen Over the next 30-45 days, patient will work with [facility] to address needs related to medication  Interventions:  . Inter-disciplinary care team collaboration (see longitudinal plan of care) . Discussed plans with patient for ongoing care management follow up and provided patient with direct contact information for care management team . Care Guide referral for for transportation needs for medicaid . 04/11/20 . Patient was seen in the office by Patrica Duel.  She came to let me know that the patient was there and she brought her medications in.   . The patient and I discussed her medications and she was able to let  me know what she was able to read on the bottle.  The patient has the bottles marked with signs and is able to tell me quite well what each bottle of pills is for.  She is going to let me know when she receives the pill packs  from the new pharmacy and bring them to the office so we can go over how to use them.  .  Patient Self Care Activities:  . Patient verbalizes understanding of plan to work with pharmacy to have medication changed to pillpacks . Attends all scheduled provider appointments . Calls provider office for new concerns or questions . Unable to independently self manage medication needs  Please see past updates related to this goal by clicking on the "Past Updates" button in the selected  goal          The care management team will reach out to the patient again over the next 10 days.   [name] RN, BSN, [facility] Care Management Coordinator [place] Phone: [phone] Fax: [phone]

## 2020-04-11 NOTE — Chronic Care Management (AMB) (Signed)
  Care Management   Follow Up Note   04/11/2020 Name: Brooke Rivera MRN: [id number] DOB: 1961/01/16  Referred by: Carollee Leitz, MD Reason for referral : Appointment [facility] Medications)   Brooke Rivera is a 59 y.o. year old female who is a primary care patient of Carollee Leitz, MD. The care management team was consulted for assistance with care management and care coordination needs.    Review of patient status, including review of consultants reports, relevant laboratory and other test results, and collaboration with appropriate care team members and the patient's provider was performed as part of comprehensive patient evaluation and provision of chronic care management services.    SDOH (Social Determinants of Health) assessments performed: No See Care Plan activities for detailed interventions related to [facility])     Advanced Directives: See Care Plan and Vynca application for related entries.   Goals Addressed              This Visit's Progress   .  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 Care Coordination needs related to Medications in a patient with Acute Systolic Heart Failure, Hypertension, Atrial Fibrillation  . Chronic Disease Management support and education needs related to Medications in a patient with Acute Systolic Heart Failure, Hypertension, Atrial Fibrillation  . Cognitive Deficits  Nurse Case Manager Clinical Goal(s):  Marland Kitchen Over the next 30-45 days, patient will work with [facility] to address needs related to medication  Interventions:  . Inter-disciplinary care team collaboration (see longitudinal plan of care) . Discussed plans with patient for ongoing care management follow up and provided patient with direct contact information for care management team . Care Guide referral for for transportation needs for medicaid . 04/11/20 . Patient was seen in  the office by Patrica Duel.  She came to let me know that the patient was there and she brought her medications in.   . The patient and I discussed her medications and she was able to let  me know what she was able to read on the bottle.  The patient has the bottles marked with signs and is able to tell me quite well what each bottle of pills is for.  She is going to let me know when she receives the pill packs  from the new pharmacy and bring them to the office so we can go over how to use them.  .  Patient Self Care Activities:  . Patient verbalizes understanding of plan to work with pharmacy to have medication changed to pillpacks . Attends all scheduled provider appointments . Calls provider office for new concerns or questions . Unable to independently self manage medication needs  Please see past updates related to this goal by clicking on the "Past Updates" button in the selected goal          The care management team will reach out to the patient again over the next 10 days.   [name] RN, BSN, [facility] Care Management Coordinator [place] Phone: [phone] Fax: [phone]

## 2020-04-12 ENCOUNTER — Encounter: Payer: Self-pay | Admitting: Family Medicine

## 2020-04-12 NOTE — Assessment & Plan Note (Signed)
Last TSH 69.1 on 07/28.  Patient just recently restart taking medications again. Currently asymptomatic. -Continue Levothyroxine 100 mcg daily -Will repeat TSH in 4-6 weeks

## 2020-04-12 NOTE — Progress Notes (Signed)
° ° °  SUBJECTIVE:   CHIEF COMPLAINT / HPI:  Follow up but unsure why  I seen patient in clinic on 07/28 and TSH at that time 69.1.  She had not taken her medication for about two weeks as she ran out.  I refilled meds at that time and called her a few days later to which she told me that she was having issues getting her medication. Today she comes in and tells me that she just got her medication a couple of days ago.  She has been working with Olivia Mackie, CM, and we have reviewed her medications with her today.  Once these meds are completed she will start to get blister packs and she is aware to follow up with Olivia Mackie to review how to use them.  She currently feels well and denies any chest pain, SOB, palpitations, nausea or vomiting.    PERTINENT  PMH / PSH:  HTN Hypothyroid   OBJECTIVE:   BP 110/72    Pulse 82    Wt 133 lb (60.3 kg)    SpO2 96%    BMI 25.13 kg/m    General: Alert and oriented, no apparent distress  Cardiovascular: RRR with no murmurs noted Respiratory: CTA bilaterally   ASSESSMENT/PLAN:   Hypertension, benign Normotensive today.  Tolerating medications. Slight elevation in Cr at last check but not AKI.   -Continue current medications -Repeat Bmet in 3-4 months -Follow as needed  Hypothyroidism Last TSH 69.1 on 07/28.  Patient just recently restart taking medications again. Currently asymptomatic. -Continue Levothyroxine 100 mcg daily -Will repeat TSH in 4-6 weeks      Carollee Leitz, MD [place]

## 2020-04-12 NOTE — Assessment & Plan Note (Signed)
Normotensive today.  Tolerating medications. Slight elevation in Cr at last check but not AKI.   -Continue current medications -Repeat Bmet in 3-4 months -Follow as needed

## 2020-04-13 ENCOUNTER — Ambulatory Visit: Payer: Medicare Other | Admitting: Licensed Clinical Social Worker

## 2020-04-13 DIAGNOSIS — Z741 Need for assistance with personal care: Secondary | ICD-10-CM

## 2020-04-13 NOTE — Chronic Care Management (AMB) (Signed)
Care Management   Clinical Social Work Follow Up   04/13/2020 Name: Brooke Rivera MRN: [id number] DOB: 05/27/1961 Referred by: Carollee Leitz, MD  Reason for referral : Care Coordination (PCS)  Brooke Rivera is a 59 y.o. year old female who is a primary care patient of Carollee Leitz, MD.  Reason for follow-up: assess for barriers and progress with [place] .   Assessment: Patient continues to experience difficulty with meeting needs for ADL's.  Assessed needs and barriers. See  Plan: LCSW will F/U patient in 2 weeks  Advance Directive Status: not addressed during this encounter.  SDOH (Social Determinants of Health) assessments performed; No needs identified   Goals Addressed            This Visit's Progress   . personal care services       CARE PLAN ENTRY (see longitudinal plan of care for additional care plan information)  Current Barriers:   . Patient with chronic medical condition needs Support, Education, and Care Coordination to resolve unmet Personal Care needs . Family was helping patient but no longer able to assist, patient lives alone and needing help with ADL's . Patient needs refill on inhaler and concern with reading medication and how to take it . PCS Referral completed and faxed to [facility], PCS assessment scheduled Aug. 24th at 9:00 Clinical Social Work Goal(s):  Marland Kitchen Over the next 30 to 45 days, patient will have personal care needs met as evident by having PCS Aide in the home assisting with needs.  Interventions provided by LCSW : . Assessed needs, level of care concerns, reviewed Personal Care Service process, . Mailed list of PCS providers, and educational information on PCS services (reviewed process with patient and discussed importance of selecting a provider) . LCSW collaborated with [facility] to verify application was received, processed and assessment scheduled . Collaboration with CCM RN - referral for CCM RN to assist with managing  chronic medical conditions and medication and other barriers. Patient Self Care Activities & Deficits:  . Patient is unable to perform ADLs independently without assistance  . Family/support system will assist patient with meeting needs until [facility] is approved . Return calls from [facility] to set up initial PCS assessment once application is approved . Call [facility] with questions [phone] or [phone]  Please see past updates related to this goal by clicking on the "Past Updates" button in the selected goal        Outpatient Encounter Medications as of 04/13/2020  Medication Sig  . acetaminophen (TYLENOL) 650 MG CR tablet Take 1 tablet (650 mg total) by mouth every 8 (eight) hours as needed for pain.  Marland Kitchen albuterol (PROVENTIL HFA;VENTOLIN HFA) 108 (90 Base) MCG/ACT inhaler Inhale 2 puffs into the lungs every 6 (six) hours as needed for wheezing or shortness of breath.  Marland Kitchen amLODipine (NORVASC) 5 MG tablet Take 1 tablet (5 mg total) by mouth daily.  Marland Kitchen aspirin EC 81 MG tablet Take 1 tablet (81 mg total) by mouth daily.  Marland Kitchen atorvastatin (LIPITOR) 80 MG tablet Take 1 tablet (80 mg total) by mouth daily at 6 PM.  . levothyroxine (SYNTHROID) 100 MCG tablet Take 1 tablet (100 mcg total) by mouth daily before breakfast.  . lisinopril (ZESTRIL) 40 MG tablet Take 1 tablet (40 mg total) by mouth daily.  . nitroGLYCERIN (NITROSTAT) 0.4 MG SL tablet Place 1 tablet (0.4 mg total) under the tongue every 5 (five) minutes x 3 doses as needed  for chest pain.  . ticagrelor (BRILINTA) 90 MG TABS tablet Take 1 tablet (90 mg total) by mouth 2 (two) times daily.   No facility-administered encounter medications on file as of 04/13/2020.   Review of patient status, including review of consultants reports, relevant laboratory and other test results, and collaboration with appropriate care team members and the patient's provider was performed as part of comprehensive patient evaluation and provision  of care management services.    Casimer Lanius, [place] / [place]   [phone] 9:36 AM

## 2020-04-18 ENCOUNTER — Ambulatory Visit: Payer: Medicare Other

## 2020-04-18 NOTE — Patient Instructions (Signed)
Visit Information  Goals Addressed              This Visit's Progress   .  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 Care Coordination needs related to Medications in a patient with Acute Systolic Heart Failure, Hypertension, Atrial Fibrillation  . Chronic Disease Management support and education needs related to Medications in a patient with Acute Systolic Heart Failure, Hypertension, Atrial Fibrillation  . Cognitive Deficits  Nurse Case Manager Clinical Goal(s):  Brooke Kitchen Over the next 30-45 days, patient will work with [facility] to address needs related to medication  Interventions:  . Inter-disciplinary care team collaboration (see longitudinal plan of care) . Discussed plans with patient for ongoing care management follow up and provided patient with direct contact information for care management team . Care Guide referral for  transportation needs for medicaid . 04/18/20 . Patient states that she is doing well not having any signs or symptoms from HTN.  She is continuing to take her medications as prescribed. . I told the patient that I was so proud of her, on how she has managed her medications . She know when these run out that she should start to get the new packs and she needs to let me know to come in the office for [place] to review how to use it. . Patient states that she received information from [place] to pick the agency she wants to use for her PCA worker.  She said she is going to let her family help her pick since she sometimes has trouble with reading.  Patient Self Care Activities:  . Patient verbalizes understanding of plan to work with pharmacy to have medication changed to pill packs . Attends all scheduled provider appointments . Calls provider office for new concerns or questions . Unable to independently self manage medication needs  Please see past updates  related to this goal by clicking on the "Past Updates" button in the selected goal         Brooke Rivera was given information about Care Management services today including:  1. Care Management services include personalized support from designated clinical staff supervised by her physician, including individualized plan of care and coordination with other care providers 2. 24/7 contact phone numbers for assistance for urgent and routine care needs. 3. The patient may stop CCM services at any time (effective at the end of the month) by phone call to the office staff.  Patient agreed to services and verbal consent obtained.   The patient verbalized understanding of instructions provided today and declined a print copy of patient instruction materials.   The care management team will reach out to the patient again over the next 14 days.   Brooke Arms RN, BSN, [facility] Care Management Coordinator [place] Phone: [phone] Fax: [phone]

## 2020-04-18 NOTE — Chronic Care Management (AMB) (Signed)
  Care Management   Follow Up Note   04/18/2020 Name: Brooke Rivera MRN: [id number] DOB: 1960/11/23  Referred by: Carollee Leitz, MD Reason for referral : Appointment (Medication)   Brooke Rivera is a 59 y.o. year old female who is a primary care patient of Carollee Leitz, MD. The care management team was consulted for assistance with care management and care coordination needs.    Review of patient status, including review of consultants reports, relevant laboratory and other test results, and collaboration with appropriate care team members and the patient's provider was performed as part of comprehensive patient evaluation and provision of chronic care management services.    SDOH (Social Determinants of Health) assessments performed: No See Care Plan activities for detailed interventions related to [facility])     Advanced Directives: See Care Plan and Vynca application for related entries.   Goals Addressed              This Visit's Progress   .  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 Care Coordination needs related to Medications in a patient with Acute Systolic Heart Failure, Hypertension, Atrial Fibrillation  . Chronic Disease Management support and education needs related to Medications in a patient with Acute Systolic Heart Failure, Hypertension, Atrial Fibrillation  . Cognitive Deficits  Nurse Case Manager Clinical Goal(s):  Marland Kitchen Over the next 30-45 days, patient will work with [facility] to address needs related to medication  Interventions:  . Inter-disciplinary care team collaboration (see longitudinal plan of care) . Discussed plans with patient for ongoing care management follow up and provided patient with direct contact information for care management team . Care Guide referral for  transportation needs for medicaid . 04/18/20 . Patient states that she is  doing well not having any signs or symptoms from HTN.  She is continuing to take her medications as prescribed. . I told the patient that I was so proud of her, on how she has managed her medications . She know when these run out that she should start to get the new packs and she needs to let me know to come in the office for [place] to review how to use it. . Patient states that she received information from [place] to pick the agency she wants to use for her PCA worker.  She said she is going to let her family help her pick since she sometimes has trouble with reading.  Patient Self Care Activities:  . Patient verbalizes understanding of plan to work with pharmacy to have medication changed to pill packs . Attends all scheduled provider appointments . Calls provider office for new concerns or questions . Unable to independently self manage medication needs  Please see past updates related to this goal by clicking on the "Past Updates" button in the selected goal          The care management team will reach out to the patient again over the next 14 days.  The patient has been provided with contact information for the care management team and has been advised to call with any health related questions or concerns.   [name] RN, BSN, [facility] Care Management Coordinator [place] Phone: [phone] Fax: [phone]

## 2020-05-05 ENCOUNTER — Ambulatory Visit: Payer: Medicare Other

## 2020-05-05 NOTE — Chronic Care Management (AMB) (Signed)
  Care Management   Follow Up Note   05/05/2020 Name: Brooke Rivera MRN: [id number] DOB: 10/30/1960  Referred by: Carollee Leitz, MD Reason for referral : Chronic Care Management (Medications)   Brooke Rivera is a 59 y.o. year old female who is a primary care patient of Carollee Leitz, MD. The care management team was consulted for assistance with care management and care coordination needs.    Review of patient status, including review of consultants reports, relevant laboratory and other test results, and collaboration with appropriate care team members and the patient's provider was performed as part of comprehensive patient evaluation and provision of chronic care management services.    SDOH (Social Determinants of Health) assessments performed: No See Care Plan activities for detailed interventions related to [facility])     Advanced Directives: See Care Plan and Vynca application for related entries.   Goals Addressed              This Visit's Progress   .  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 Care Coordination needs related to Medications in a patient with Acute Systolic Heart Failure, Hypertension, Atrial Fibrillation  . Chronic Disease Management support and education needs related to Medications in a patient with Acute Systolic Heart Failure, Hypertension, Atrial Fibrillation  . Cognitive Deficits  Nurse Case Manager Clinical Goal(s):  Marland Kitchen Over the next 30-45 days, patient will work with [facility] to address needs related to medication  Interventions:  . Inter-disciplinary care team collaboration (see longitudinal plan of care) . Discussed plans with patient for ongoing care management follow up and provided patient with direct contact information for care management team . Care Guide referral for  transportation needs for medicaid . Patient states that she is  doing well not having any signs or symptoms from HTN.  She is continuing to take her medications as prescribed. . Patient has 3 months worth of medications that she picked up from [facility] on 03-31-20 for lisinopril, levothyroxine, Lipitor, and amlodipine.  She also takes Brilinta which was delivered to her home on august 9,21 by [employer].  The  patient states that she never received it.  RNCM called the pharmacy and Joelene Millin stated that the driver delivers it to the door.  They will be delivering her another packet to her tomorrow . Advised the patient to call me and notify when she receives the packet so we can review it.  Patient Self Care Activities:  . Patient verbalizes understanding of plan to work with pharmacy to have medication changed to pill packs . Attends all scheduled provider appointments . Calls provider office for new concerns or questions . Unable to independently self manage medication needs  Please see past updates related to this goal by clicking on the "Past Updates" button in the selected goal          The care management team will reach out to the patient again over the next 14 days.   [name] RN, BSN, [facility] Care Management Coordinator [place] Phone: [phone] Fax: [phone]

## 2020-05-05 NOTE — Patient Instructions (Signed)
Visit Information  Goals Addressed              This Visit's Progress   .  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 Care Coordination needs related to Medications in a patient with Acute Systolic Heart Failure, Hypertension, Atrial Fibrillation  . Chronic Disease Management support and education needs related to Medications in a patient with Acute Systolic Heart Failure, Hypertension, Atrial Fibrillation  . Cognitive Deficits  Nurse Case Manager Clinical Goal(s):  Marland Kitchen Over the next 30-45 days, patient will work with [facility] to address needs related to medication  Interventions:  . Inter-disciplinary care team collaboration (see longitudinal plan of care) . Discussed plans with patient for ongoing care management follow up and provided patient with direct contact information for care management team . Care Guide referral for  transportation needs for medicaid . Patient states that she is doing well not having any signs or symptoms from HTN.  She is continuing to take her medications as prescribed. . Patient has 3 months worth of medications that she picked up from [facility] on 03-31-20 for lisinopril, levothyroxine, Lipitor, and amlodipine.  She also takes Brilinta which was delivered to her home on august 9,21 by [employer].  The  patient states that she never received it.  RNCM called the pharmacy and Joelene Millin stated that the driver delivers it to the door.  They will be delivering her another packet to her tomorrow . Advised the patient to call me and notify when she receives the packet so we can review it.  Patient Self Care Activities:  . Patient verbalizes understanding of plan to work with pharmacy to have medication changed to pill packs . Attends all scheduled provider appointments . Calls provider office for new concerns or questions . Unable to  independently self manage medication needs  Please see past updates related to this goal by clicking on the "Past Updates" button in the selected goal         Ms. Winquist was given information about Care Management services today including:  1. Care Management services include personalized support from designated clinical staff supervised by her physician, including individualized plan of care and coordination with other care providers 2. 24/7 contact phone numbers for assistance for urgent and routine care needs. 3. The patient may stop CCM services at any time (effective at the end of the month) by phone call to the office staff.  Patient agreed to services and verbal consent obtained.   The patient verbalized understanding of instructions provided today and declined a print copy of patient instruction materials.   The care management team will reach out to the patient again over the next 14 days.   [name] RN, BSN, [facility] Care Management Coordinator [place] Phone: [phone] Fax: [phone]

## 2020-05-06 ENCOUNTER — Ambulatory Visit: Payer: Medicare Other | Admitting: Licensed Clinical Social Worker

## 2020-05-06 DIAGNOSIS — Z741 Need for assistance with personal care: Secondary | ICD-10-CM

## 2020-05-06 NOTE — Chronic Care Management (AMB) (Signed)
Care Management   Clinical Social Work Follow Up   05/06/2020 Name: Brooke Rivera MRN: [id number] DOB: 31-Dec-1960 Referred by: Carollee Leitz, MD  Reason for referral : Care Coordination (PCS)  Brooke Rivera is a 59 y.o. year old female who is a primary care patient of Carollee Leitz, MD.  Reason for follow-up: assess for barriers and progress with care plan .   Assessment: Patient continues to experience difficulty with getting PCS services started.see care plan below.  Patient would like continued follow-up from CCM LCSW. Plan: LCSW will F/U in 2 weeks  Advance Directive Status:   not addressed during this encounter.  SDOH (Social Determinants of Health) assessments performed: No needs identified   Goals Addressed            This Visit's Progress   . personal care services   On track    [place] (see longitudinal plan of care for additional care plan information)  Current Barriers:   . Patient with chronic medical condition needs Support, Education, and Care Coordination to resolve unmet Personal Care needs . Family was helping patient but no longer able to assist, patient lives alone and needing help with ADL's . Patient needs refill on inhaler and concern with reading medication and how to take it . PCS Referral completed and faxed to [facility], PCS assessment scheduled Aug. 24th at 9:00 . Patient selected [place] [phone] As of today patient has not heard from them Clinical Social Work Goal(s):  Marland Kitchen Over the next 30 to 45 days, patient will have personal care needs met as evident by having PCS Aide in the home assisting with needs.  Interventions provided by LCSW : . Assessed needs, level of care concerns,  . [place] with patient on the line; spoke with [place] to assess barriers with starting services . Collaboration with CCM RN for on going needs Patient Self Care Activities & Deficits:   . Patient is unable to perform ADLs independently without assistance  . Family/support system will assist patient with meeting needs until [facility] is approved . Return calls from [facility] to set up initial PCS assessment once application is approved . Call [facility] with questions [phone] or [phone]  Please see past updates related to this goal by clicking on the "Past Updates" button in the selected goal        Outpatient Encounter Medications as of 05/06/2020  Medication Sig  . albuterol (PROVENTIL HFA;VENTOLIN HFA) 108 (90 Base) MCG/ACT inhaler Inhale 2 puffs into the lungs every 6 (six) hours as needed for wheezing or shortness of breath.  Marland Kitchen amLODipine (NORVASC) 5 MG tablet Take 1 tablet (5 mg total) by mouth daily.  Marland Kitchen aspirin EC 81 MG tablet Take 1 tablet (81 mg total) by mouth daily.  Marland Kitchen atorvastatin (LIPITOR) 80 MG tablet Take 1 tablet (80 mg total) by mouth daily at 6 PM.  . levothyroxine (SYNTHROID) 100 MCG tablet Take 1 tablet (100 mcg total) by mouth daily before breakfast.  . lisinopril (ZESTRIL) 40 MG tablet Take 1 tablet (40 mg total) by mouth daily.  . nitroGLYCERIN (NITROSTAT) 0.4 MG SL tablet Place 1 tablet (0.4 mg total) under the tongue every 5 (five) minutes x 3 doses as needed for chest pain.  . ticagrelor (BRILINTA) 90 MG TABS tablet Take 1 tablet (90 mg total) by mouth 2 (two) times daily.   No facility-administered encounter medications on file as of 05/06/2020.  Review of patient status, including review of consultants reports, relevant laboratory and other test results, and collaboration with appropriate care team members and the patient's provider was performed as part of comprehensive patient evaluation and provision of care management services.    Casimer Lanius, [place] / [place]   [phone] 9:07 AM

## 2020-05-09 ENCOUNTER — Ambulatory Visit: Payer: Medicare Other

## 2020-05-09 NOTE — Patient Instructions (Signed)
Visit Information  Goals Addressed              This Visit's Progress   .  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 Care Coordination needs related to Medications in a patient with Acute Systolic Heart Failure, Hypertension, Atrial Fibrillation  . Chronic Disease Management support and education needs related to Medications in a patient with Acute Systolic Heart Failure, Hypertension, Atrial Fibrillation  . Cognitive Deficits  Nurse Case Manager Clinical Goal(s):  Marland Kitchen Over the next 30-45 days, patient will work with [facility] to address needs related to medication  Interventions:  . Inter-disciplinary care team collaboration (see longitudinal plan of care) . Discussed plans with patient for ongoing care management follow up and provided patient with direct contact information for care management team . Care Guide referral for  transportation needs for medicaid . Patient states that she is doing well not having any signs or symptoms from HTN.  She is continuing to take her medications as prescribed. Marland Kitchen Spoke with the patient today and she did receive the medication pack from the new pharmacy [facility].  That is the only medication because she has a 3 month supply of medications that she picked up from [facility].  Once she finishes that they will send the rest of the packed medication to her.  She was able to spell the medication out to me.  Tell me what the medication was for ans how she is suppose to take it.  Patient Self Care Activities:  . Patient verbalizes understanding of plan to work with pharmacy to have medication changed to pill packs . Attends all scheduled provider appointments . Calls provider office for new concerns or questions . Unable to independently self manage medication needs  Please see past updates related to this goal by clicking on the "Past Updates"  button in the selected goal         Ms. Munoz was given information about Care Management services today including:  1. Care Management services include personalized support from designated clinical staff supervised by her physician, including individualized plan of care and coordination with other care providers 2. 24/7 contact phone numbers for assistance for urgent and routine care needs. 3. The patient may stop CCM services at any time (effective at the end of the month) by phone call to the office staff.  Patient agreed to services and verbal consent obtained.   The patient verbalized understanding of instructions provided today and declined a print copy of patient instruction materials.   The care management team will reach out to the patient again over the next 14 days.   [name] RN, BSN, [facility] Care Management Coordinator [place] Phone: [phone] Fax: [phone]

## 2020-05-09 NOTE — Chronic Care Management (AMB) (Signed)
  Care Management   Follow Up Note   05/09/2020 Name: Brooke Rivera MRN: [id number] DOB: 1961/06/26  Referred by: Carollee Leitz, MD Reason for referral : Chronic Care Management (Medications)   Brooke Rivera is a 59 y.o. year old female who is a primary care patient of Carollee Leitz, MD. The care management team was consulted for assistance with care management and care coordination needs.    Review of patient status, including review of consultants reports, relevant laboratory and other test results, and collaboration with appropriate care team members and the patient's provider was performed as part of comprehensive patient evaluation and provision of chronic care management services.    SDOH (Social Determinants of Health) assessments performed: No See Care Plan activities for detailed interventions related to [name])     Advanced Directives: See Care Plan and Vynca application for related entries.   Goals Addressed              This Visit's Progress   .  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 Care Coordination needs related to Medications in a patient with Acute Systolic Heart Failure, Hypertension, Atrial Fibrillation  . Chronic Disease Management support and education needs related to Medications in a patient with Acute Systolic Heart Failure, Hypertension, Atrial Fibrillation  . Cognitive Deficits  Nurse Case Manager Clinical Goal(s):  Marland Kitchen Over the next 30-45 days, patient will work with [facility] to address needs related to medication  Interventions:  . Inter-disciplinary care team collaboration (see longitudinal plan of care) . Discussed plans with patient for ongoing care management follow up and provided patient with direct contact information for care management team . Care Guide referral for  transportation needs for medicaid . Patient states that she is  doing well not having any signs or symptoms from HTN.  She is continuing to take her medications as prescribed. Marland Kitchen Spoke with the patient today and she did receive the medication pack from the new pharmacy [facility].  That is the only medication because she has a 3 month supply of medications that she picked up from [employer].  Once she finishes that they will send the rest of the packed medication to her.  She was able to spell the medication out to me.  Tell me what the medication was for ans how she is suppose to take it.  Patient Self Care Activities:  . Patient verbalizes understanding of plan to work with pharmacy to have medication changed to pill packs . Attends all scheduled provider appointments . Calls provider office for new concerns or questions . Unable to independently self manage medication needs  Please see past updates related to this goal by clicking on the "Past Updates" button in the selected goal          The care management team will reach out to the patient again over the next 14 days.   [name] RN, BSN, [facility] Care Management Coordinator [place] Phone: [phone] Fax: [phone]

## 2020-05-13 ENCOUNTER — Telehealth: Payer: Self-pay | Admitting: Family Medicine

## 2020-05-13 NOTE — Telephone Encounter (Signed)
   SF 05/13/2020   Name: Brooke Rivera   MRN: [id number]   DOB: 1961-03-06   AGE: 59 y.o.   GENDER: female   PCP Carollee Leitz, MD.   Spoke with [name] today regarding referral for Medicaid and transportation. Patient stated that she has Medicare and Medicaid. Asked patient if she is still in need of transportation, patient stated that she uses [facility] and that she has to call at least 3 days in advance to schedule a ride for an appointment. Informed patient that she may also be able to use Medicaid transportation for her appointments. Patient asked if Care Guide can send information regarding to Medicaid transportation. Care Guide will send information to patient. Patient also stated that she has spoke with the LCSW in the office regarding PCS services but has not heard anything. Informed patient that Care Guide will send a message to the LCSW.    Follow up on: 05/16/2020  [place], Care Management Phone: [phone] Email: [email or web address]

## 2020-05-16 ENCOUNTER — Encounter: Payer: Self-pay | Admitting: Family Medicine

## 2020-05-16 NOTE — Telephone Encounter (Signed)
Sent e-mail to [employer]) to print and mail letter to patient.

## 2020-05-19 ENCOUNTER — Ambulatory Visit: Payer: Medicare Other | Admitting: Licensed Clinical Social Worker

## 2020-05-19 DIAGNOSIS — Z7189 Other specified counseling: Secondary | ICD-10-CM

## 2020-05-19 DIAGNOSIS — Z741 Need for assistance with personal care: Secondary | ICD-10-CM

## 2020-05-19 NOTE — Chronic Care Management (AMB) (Signed)
Care Management   Clinical Social Work Follow Up   05/19/2020 Name: Brooke Rivera MRN: [id number] DOB: [birth year] Referred by: Carollee Leitz, MD  Reason for referral : Care Coordination (PCS)  Brooke Rivera is a 59 y.o. year old female who is a primary care patient of Carollee Leitz, MD.  Reason for follow-up: assess for barriers and progress with care plan .    Assessment: Patient was denied PCS services.  States her health has declined and she would like PCP to send another referral.    Plan: LCSW will collaborate with PCP after office visit to see if PCP would like a new referral submitted Advance Directive Status: [place]  for related entries. Marland Kitchen  SDOH (Social Determinants of Health) assessments performed: No new needs identified   Goals Addressed            This Visit's Progress   . Advance Directive       CARE PLAN ENTRY (see longitudinal plan of care for additional care plan information)  Current Barriers:  . Patient does not have an Forensic scientist . Patient acknowledges deficits, education and support in order to complete this document Clinical Social Work Goal(s): Over the next 30 to 60 days,  . the patient will review and complete Advance Directive packet, have notarized and provide a copy to provider office Interventions provided by LCSW: . Assessed understanding of Advance Directives . A voluntary discussion about advanced care planning including importance of advanced directives, healthcare proxy and living will was discussed with the patient.  . Mailed the patient information on Advance Directives as well as an Advance Directive packet Patient Self Care Activities:  . Is able to complete documentation independently . Able to identify [place] / [place] . patient will review information mailed by LCSW with her cousin Initial goal documentation     . personal care services   Not on track    [place] (see  longitudinal plan of care for additional care plan information)  Current Barriers:   . Patient with chronic medical condition needs Support, Education, and Care Coordination to resolve unmet Personal Care needs . Family was helping patient but no longer able to assist, patient lives alone and needing help with ADL's . Patient needs refill on inhaler and concern with reading medication and how to take it . PCS Referral completed and faxed to [facility], PCS assessment scheduled Aug. 24th at 9:00 . Patient selected [place] [phone] As of today patient has not heard from them . Patient has was denied PCS services after the assessment with [place] Work Goal(s):  Marland Kitchen Over the next 30 to 45 days, patient will have personal care needs met as evident by having PCS Aide in the home assisting with needs.  Interventions provided by LCSW : . Assessed needs, level of care concerns, informed patient that PCS was denied  . LCSW Contacted [place]; spoke with [place] to assess barriers with starting services states unable to provide service as [name] did not approve hours . [facility], confirmed patient was not approved stated letter mailed in Aug. . Patient reports her health continues to decline and would like a new referral submitted ( LCSW will collaborate with PCP after Sept office visit with patient to see if she would like to submit a new PCS referral based on change in condition.  . Collaboration with CCM RN  for on going needs Patient Self Care Activities & Deficits:  . Patient is unable to perform ADLs independently without assistance  . Family/support system will assist patient with meeting needs until PCS is approved Please see past updates related to this goal by clicking on the "Past Updates" button in the selected goal        Outpatient Encounter Medications as of 05/19/2020  Medication Sig  .  albuterol (PROVENTIL HFA;VENTOLIN HFA) 108 (90 Base) MCG/ACT inhaler Inhale 2 puffs into the lungs every 6 (six) hours as needed for wheezing or shortness of breath.  Marland Kitchen amLODipine (NORVASC) 5 MG tablet Take 1 tablet (5 mg total) by mouth daily.  Marland Kitchen aspirin EC 81 MG tablet Take 1 tablet (81 mg total) by mouth daily.  Marland Kitchen atorvastatin (LIPITOR) 80 MG tablet Take 1 tablet (80 mg total) by mouth daily at 6 PM.  . levothyroxine (SYNTHROID) 100 MCG tablet Take 1 tablet (100 mcg total) by mouth daily before breakfast.  . lisinopril (ZESTRIL) 40 MG tablet Take 1 tablet (40 mg total) by mouth daily.  . nitroGLYCERIN (NITROSTAT) 0.4 MG SL tablet Place 1 tablet (0.4 mg total) under the tongue every 5 (five) minutes x 3 doses as needed for chest pain.  . ticagrelor (BRILINTA) 90 MG TABS tablet Take 1 tablet (90 mg total) by mouth 2 (two) times daily.   No facility-administered encounter medications on file as of 05/19/2020.   Review of patient status, including review of consultants reports, relevant laboratory and other test results, and collaboration with appropriate care team members and the patient's provider was performed as part of comprehensive patient evaluation and provision of care management services.   Casimer Lanius, [place] / [place]   [phone] 12:37 PM

## 2020-05-20 ENCOUNTER — Ambulatory Visit: Payer: Medicare Other

## 2020-05-20 NOTE — Chronic Care Management (AMB) (Signed)
  Care Management   Follow Up Note   05/20/2020 Name: Brooke Rivera MRN: [id number] DOB: 1961/04/23  Referred by: Carollee Leitz, MD Reason for referral : Appointment (Medication)   Brooke Rivera is a 59 y.o. year old female who is a primary care patient of Carollee Leitz, MD. The care management team was consulted for assistance with care management and care coordination needs.    Review of patient status, including review of consultants reports, relevant laboratory and other test results, and collaboration with appropriate care team members and the patient's provider was performed as part of comprehensive patient evaluation and provision of chronic care management services.    SDOH (Social Determinants of Health) assessments performed: No See Care Plan activities for detailed interventions related to [facility])     Advanced Directives: See Care Plan and Vynca application for related entries.   Goals Addressed              This Visit's Progress   .  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 Care Coordination needs related to Medications in a patient with Acute Systolic Heart Failure, Hypertension, Atrial Fibrillation  . Chronic Disease Management support and education needs related to Medications in a patient with Acute Systolic Heart Failure, Hypertension, Atrial Fibrillation  . Cognitive Deficits  Nurse Case Manager Clinical Goal(s):  Marland Kitchen Over the next 30-45 days, patient will work with [facility] to address needs related to medication  Interventions:  . Inter-disciplinary care team collaboration (see longitudinal plan of care) . Discussed plans with patient for ongoing care management follow up and provided patient with direct contact information for care management team . Care Guide referral for  transportation needs for medicaid . Patient states that she is doing well  not having any signs or symptoms from HTN.  She is continuing to take her medications as prescribed. . Patient just woke up.  She is doing fine with her medications. . She still has the bottles from [facility] and once she runs out will start with pill packs from [facility].  Patient Self Care Activities:  . Patient verbalizes understanding of plan to work with pharmacy to have medication changed to pill packs . Attends all scheduled provider appointments . Calls provider office for new concerns or questions . Unable to independently self manage medication needs  Please see past updates related to this goal by clicking on the "Past Updates" button in the selected goal          The care management team will reach out to the patient again over the next 21 days.   [name] RN, BSN, [facility] Care Management Coordinator [place] Phone: [phone] Fax: [phone]

## 2020-05-20 NOTE — Patient Instructions (Signed)
Visit Information  Goals Addressed              This Visit's Progress   .  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 Care Coordination needs related to Medications in a patient with Acute Systolic Heart Failure, Hypertension, Atrial Fibrillation  . Chronic Disease Management support and education needs related to Medications in a patient with Acute Systolic Heart Failure, Hypertension, Atrial Fibrillation  . Cognitive Deficits  Nurse Case Manager Clinical Goal(s):  Marland Kitchen Over the next 30-45 days, patient will work with [facility] to address needs related to medication  Interventions:  . Inter-disciplinary care team collaboration (see longitudinal plan of care) . Discussed plans with patient for ongoing care management follow up and provided patient with direct contact information for care management team . Care Guide referral for  transportation needs for medicaid . Patient states that she is doing well not having any signs or symptoms from HTN.  She is continuing to take her medications as prescribed. . Patient just woke up.  She is doing fine with her medications. . She still has the bottles from [facility] and once she runs out will start with pill packs from [facility].  Patient Self Care Activities:  . Patient verbalizes understanding of plan to work with pharmacy to have medication changed to pill packs . Attends all scheduled provider appointments . Calls provider office for new concerns or questions . Unable to independently self manage medication needs  Please see past updates related to this goal by clicking on the "Past Updates" button in the selected goal         Ms. Booton was given information about Care Management services today including:  1. Care Management services include personalized support from designated clinical staff supervised by her  physician, including individualized plan of care and coordination with other care providers 2. 24/7 contact phone numbers for assistance for urgent and routine care needs. 3. The patient may stop CCM services at any time (effective at the end of the month) by phone call to the office staff.  Patient agreed to services and verbal consent obtained.   The patient verbalized understanding of instructions provided today and declined a print copy of patient instruction materials.   The care management team will reach out to the patient again over the next 21 days.   [name] RN, BSN, [facility] Care Management Coordinator [place] Phone: [phone] Fax: [phone]

## 2020-05-25 ENCOUNTER — Ambulatory Visit: Payer: Medicare Other | Admitting: Family Medicine

## 2020-06-03 ENCOUNTER — Telehealth: Payer: Self-pay

## 2020-06-03 NOTE — Telephone Encounter (Signed)
°  Care Management   Outreach Note  06/03/2020 Name: Brooke Rivera MRN: [id number] DOB: 06-30-1961  Referred by: Carollee Leitz, MD Reason for referral : Appointment (Medications / HTN)   An unsuccessful telephone outreach was attempted today. The patient was referred to the case management team for assistance with care management and care coordination.   Follow Up Plan: A HIPAA compliant phone message was left for the patient providing contact information and requesting a return call.  X 2 The care management team will reach out to the patient again over the next 7-14 days.   [name] RN, BSN, [facility] Care Management Coordinator [place] Phone: [phone] Fax: [phone]

## 2020-06-03 NOTE — Telephone Encounter (Signed)
  Care Management   Follow Up Note   06/03/2020 Name: Brooke Rivera MRN: [id number] DOB: 04/16/1961  Referred by: Carollee Leitz, MD Reason for referral : Appointment (Medications/HTN)   Brooke Rivera is a 59 y.o. year old female who is a primary care patient of Carollee Leitz, MD. Returned call to [facility]  She stated that nothing had changed with her situation with her medications.  She is still using her 90 day prescriptions from [facility].  She states that her blood pressure is fine and she does not have any of the signs or symptoms of HTN.  When asked about her diet the patient states that she eats regular foods with low salt.  Review of patient status, including review of consultants reports, relevant laboratory and other test results, and collaboration with appropriate care team members and the patient's provider was performed as part of comprehensive patient evaluation and provision of chronic care management services.     SDOH (Social Determinants of Health) assessments performed: No  Advanced Directives: See Care Plan and Vynca application for related entries.    PLAN:  RNCM will follow up with the patient in the month of November   Jacobi Ryant RN, BSN, [facility] Care Management Coordinator [place] Phone: [phone] Fax: [phone]

## 2020-06-06 NOTE — Progress Notes (Signed)
This encounter was created in error - please disregard.

## 2020-06-06 NOTE — Telephone Encounter (Signed)
Scheduled for 07/05/2020 Follow up call with RN CM

## 2020-06-22 NOTE — Patient Instructions (Signed)
Thank you for coming to see me today. It was a pleasure.   Today we will check you thyroid level and cholesterol level.  I will call you with the results.  Please book an appointment for PAP smear.  Neoma Laming the Education officer, museum will start your papers for home assistance. Traci, the nurse manager will call pharmacy to have your medication in blister packs.  Please follow-up with me in 6 weeks  If you have any questions or concerns, please do not hesitate to call the office at [phone].  Best,   Carollee Leitz, MD [facility]

## 2020-06-22 NOTE — Progress Notes (Signed)
    SUBJECTIVE:   CHIEF COMPLAINT / HPI:  Follow up   No acute concerns  Hypothyroid Asymptomatic.  Unsure if she is taking thyroid medication regularly.  Thinks she may have missed some doses.  Her next refill will be blister packed and she likes this better as it keeps her on schedule with medications.  Home assistance Had been declined home assistance previously.  Patient reports that she requires assistance with IADLs.  PERTINENT  PMH / PSH:  Intellect disability  OBJECTIVE:   BP 108/64   Pulse 72   Wt 124 lb (56.2 kg)   SpO2 100%   BMI 23.43 kg/m    General: Alert and oriented, no apparent distress  Neck: nontender. No enlarged thyroid noted Cardiovascular: RRR with no murmurs noted Respiratory: CTA bilaterally  Psych: Behavior and speech appropriate to situation  ASSESSMENT/PLAN:   Hypothyroidism Asymptomatic.  Last TSH 69.  Unsure if taking medications daily.   -repeat TSH today -Continue Synthroid 100 mcg, will now be blister packed for easier compliance -Follow up in 4-6 weeks for repeat TSH   LEARNING DISABILITY Reports increase need for IADLs -Will resubmit forms to request home assistance  Healthcare maintenance Mammogram ordered Hep C screening labs Patient will book for PAP smear Lipid panel     Carollee Leitz, MD [place]

## 2020-06-23 ENCOUNTER — Ambulatory Visit (INDEPENDENT_AMBULATORY_CARE_PROVIDER_SITE_OTHER): Payer: Medicare Other | Admitting: Family Medicine

## 2020-06-23 ENCOUNTER — Ambulatory Visit: Payer: Medicare Other

## 2020-06-23 ENCOUNTER — Other Ambulatory Visit: Payer: Self-pay

## 2020-06-23 ENCOUNTER — Ambulatory Visit: Payer: Medicare Other | Admitting: Licensed Clinical Social Worker

## 2020-06-23 ENCOUNTER — Encounter: Payer: Self-pay | Admitting: Family Medicine

## 2020-06-23 VITALS — BP 108/64 | HR 72 | Wt 124.0 lb

## 2020-06-23 DIAGNOSIS — I255 Ischemic cardiomyopathy: Secondary | ICD-10-CM

## 2020-06-23 DIAGNOSIS — Z741 Need for assistance with personal care: Secondary | ICD-10-CM

## 2020-06-23 DIAGNOSIS — E039 Hypothyroidism, unspecified: Secondary | ICD-10-CM

## 2020-06-23 DIAGNOSIS — Z0000 Encounter for general adult medical examination without abnormal findings: Secondary | ICD-10-CM

## 2020-06-23 DIAGNOSIS — E785 Hyperlipidemia, unspecified: Secondary | ICD-10-CM | POA: Diagnosis not present

## 2020-06-23 DIAGNOSIS — Z1231 Encounter for screening mammogram for malignant neoplasm of breast: Secondary | ICD-10-CM

## 2020-06-23 MED ORDER — LEVOTHYROXINE SODIUM 100 MCG PO TABS
100.0000 ug | ORAL_TABLET | Freq: Every day | ORAL | 0 refills | Status: DC
Start: 2020-06-23 — End: 2020-06-27

## 2020-06-23 NOTE — Chronic Care Management (AMB) (Signed)
   RNCM Care Management Collaboration 06/23/2020 Name: Brooke Rivera MRN: [id number] DOB: [birth year]   Brooke Rivera is a 59 y.o. year old female who sees Carollee Leitz, MD for primary care. RNCM was consulted by  Dr. Volanda Napoleon to assistance patient with  Care Coordination for pill packaging .    Assessment: Patient was seen in the office and presented her pill packs.  She did not have her synthroid in the pill packs and the Brilinta was packaged separately from the rest. RNCM was contact while patient was in the office for her visit with the doctor.   Intervention: Patient was not interviewed or contacted during this encounter. Review of patient status, including review of consultants reports, relevant laboratory and other test results, and collaboration with appropriate care team members and the patient's provider was performed as part of comprehensive patient evaluation and provision of chronic care management services.  RNCM contact [employer] and was told the [place] had to prior authorized. Due to this, the medication was prepared prior to the other medications being filled and was put in a separate pill pack.  The patient does not have a prescription for the synthroid and needs a new sent in.  If she can have one sent in now the synthroid and Brilinta will be packaged all together in the packs together.on their next delivery on 06/29/20.     Plan:  1. Sent a message to the PCP with this information and asking for a prescription for synthroid to be sent to the pharmacy.   2.   RNCM will follow up with the patient to make sure the medications come as they should when delivered       on 06/29/20     [name] RN, BSN, [place] Phone: [phone] Fax: [phone]

## 2020-06-23 NOTE — Chronic Care Management (AMB) (Signed)
  Care Management   Clinical Social Work Follow Up   06/23/2020 Name: Brooke Rivera MRN: [id number] DOB: Jan 15, 1961 Referred by: Carollee Leitz, MD  Reason for referral : Care Coordination (PCS)  Brooke Rivera is a 59 y.o. year old female who is a primary care patient of Carollee Leitz, MD.  Reason for follow-up: assess for barriers and progress with personal care need .    Assessment: Patient continues to experience difficulty in meeting activities of daily living.Provider would like to complete new PCS referral. LCSW collaborated with PCP and CCM RN during this encounter.   Plan: LCSW will complete referral and f/u with patient in about 2 weeks   Advance Directive Status: not addressed during this encounter.  SDOH (Social Determinants of Health) assessments performed:  No new needs identified   Patient Care Plan: personal care services  Problem Identified: not able to meet activitied of daily living   Goal: Self-Management Plan Developed   Current Barriers:   . Patient unable to meet activities of daily living and needs assistance and support in order to meet this unmet need Clinical Goals: Over the next 30 to 45 days, patient will have personal care needs met as evident by having PCS Aide in the home assisting with needs.  Interventions : . Assessed needs, level of care concerns, basic eligibility and provided education on Personal Care Service process,  . Collaborate with primary care provider ref completing PCS referral ( Referral placed in PCP's mailbox 06/23/2020 ) . PCS referral will be faxed to [employer] at [phone] once completed and signed by PCP . LCSW will collaborate with [facility] to verify application is received and processed.  Patient Activities . Review personal care service information and list mailed  . Select 2 to 3 agencies you would like to use, keep this until your assessment is completed by [employer] . Return calls from [facility] or call them directly if you have questions [phone] or [phone]       Outpatient Encounter Medications as of 06/23/2020  Medication Sig  . albuterol (PROVENTIL HFA;VENTOLIN HFA) 108 (90 Base) MCG/ACT inhaler Inhale 2 puffs into the lungs every 6 (six) hours as needed for wheezing or shortness of breath.  Marland Kitchen amLODipine (NORVASC) 5 MG tablet Take 1 tablet (5 mg total) by mouth daily.  Marland Kitchen aspirin EC 81 MG tablet Take 1 tablet (81 mg total) by mouth daily.  Marland Kitchen atorvastatin (LIPITOR) 80 MG tablet Take 1 tablet (80 mg total) by mouth daily at 6 PM.  . levothyroxine (SYNTHROID) 100 MCG tablet Take 1 tablet (100 mcg total) by mouth daily before breakfast.  . lisinopril (ZESTRIL) 40 MG tablet Take 1 tablet (40 mg total) by mouth daily.  . nitroGLYCERIN (NITROSTAT) 0.4 MG SL tablet Place 1 tablet (0.4 mg total) under the tongue every 5 (five) minutes x 3 doses as needed for chest pain.  . ticagrelor (BRILINTA) 90 MG TABS tablet Take 1 tablet (90 mg total) by mouth 2 (two) times daily.   No facility-administered encounter medications on file as of 06/23/2020.   Review of patient status, including review of consultants reports, relevant laboratory and other test results, and collaboration with appropriate care team members and the patient's provider was performed as part of comprehensive patient evaluation and provision of care management services.   Brooke Rivera, H. Rivera [facility] / Brooke Rivera   [phone] 2:26 PM

## 2020-06-23 NOTE — Patient Instructions (Signed)
Brooke Rivera  it was nice speaking with you. Please call me directly [phone] if you have questions about the goals we discussed. Goals Addressed            This Visit's Progress   . personal care services       Patient Activities:  . Review personal care service information and list mailed  . Select 2 to 3 agencies you would like to use, keep this until your assessment is completed by [employer] . Return calls from [facility] or call them directly if you have questions [phone] or [phone]      Brooke Rivera received Care Management services today:  1. Care Management services include personalized support from designated clinical staff supervised by her physician, including individualized plan of care and coordination with other care providers 2. 24/7 contact [phone] for assistance for urgent and routine care needs. 3. Care Management are voluntary services and be declined at any time by calling the office.  Patient verbalizes understanding of instructions provided today.  Follow up plan: SW will follow up with patient by phone over the next 2 weeks  Maurine Cane, LCSW

## 2020-06-24 LAB — LIPID PANEL
Chol/HDL Ratio: 2.9 ratio (ref 0.0–4.4)
Cholesterol, Total: 201 mg/dL — ABNORMAL HIGH (ref 100–199)
HDL: 69 mg/dL (ref >39)
LDL Chol Calc (NIH): 113 mg/dL — ABNORMAL HIGH (ref 0–99)
Triglycerides: 106 mg/dL (ref 0–149)
VLDL Cholesterol Cal: 19 mg/dL (ref 5–40)

## 2020-06-24 LAB — TSH: TSH: 45.2 u[IU]/mL — ABNORMAL HIGH (ref 0.450–4.500)

## 2020-06-25 ENCOUNTER — Encounter: Payer: Self-pay | Admitting: Family Medicine

## 2020-06-25 DIAGNOSIS — Z0000 Encounter for general adult medical examination without abnormal findings: Secondary | ICD-10-CM | POA: Insufficient documentation

## 2020-06-25 HISTORY — DX: Encounter for general adult medical examination without abnormal findings: Z00.00

## 2020-06-25 NOTE — Assessment & Plan Note (Signed)
Asymptomatic.  Last TSH 69.  Unsure if taking medications daily.   -repeat TSH today -Continue Synthroid 100 mcg, will now be blister packed for easier compliance -Follow up in 4-6 weeks for repeat TSH

## 2020-06-25 NOTE — Assessment & Plan Note (Addendum)
Mammogram ordered Hep C screening labs Patient will book for PAP smear Lipid panel

## 2020-06-25 NOTE — Assessment & Plan Note (Signed)
Reports increase need for IADLs -Will resubmit forms to request home assistance

## 2020-06-27 ENCOUNTER — Ambulatory Visit: Payer: Medicare Other

## 2020-06-27 ENCOUNTER — Other Ambulatory Visit: Payer: Self-pay | Admitting: Family Medicine

## 2020-06-27 ENCOUNTER — Encounter: Payer: Self-pay | Admitting: Family Medicine

## 2020-06-27 MED ORDER — LEVOTHYROXINE SODIUM 100 MCG PO TABS
100.0000 ug | ORAL_TABLET | Freq: Every day | ORAL | 0 refills | Status: DC
Start: 2020-06-27 — End: 2020-11-13

## 2020-06-27 MED ORDER — ALBUTEROL SULFATE HFA 108 (90 BASE) MCG/ACT IN AERS: 2.0000 | INHALATION_SPRAY | Freq: Four times a day (QID) | RESPIRATORY_TRACT | 1 refills | Status: DC | PRN

## 2020-06-27 NOTE — Addendum Note (Signed)
Addended by: Valerie Roys on: 06/27/2020 11:35 AM   Modules accepted: Orders

## 2020-06-27 NOTE — Progress Notes (Signed)
Medication resent to pharmacy per patient request.  She is using [employer].  Caralyn Twining,CMA

## 2020-06-27 NOTE — Chronic Care Management (AMB) (Signed)
   RN  Care Management   Follow Up Note   06/27/2020 Name: Kianni Lheureux MRN: [id number] DOB: [birth year]  Reason for referral : Medication Management (Synthoid)   Reata Petrov is a 59 y.o. year old female who is a primary care patient of Carollee Leitz, MD. The care management team was consulted for assistance with care management and care coordination needs.    Subjective: " I need my Synthroid"  Assessment: Patient called in to let me know that she received her new blister packs but her synthroid was not included.   Goals Addressed              This Visit's Progress   .  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 Care Coordination needs related to Medications in a patient with Acute Systolic Heart Failure, Hypertension, Atrial Fibrillation  . Chronic Disease Management support and education needs related to Medications in a patient with Acute Systolic Heart Failure, Hypertension, Atrial Fibrillation  . Cognitive Deficits  Nurse Case Manager Clinical Goal(s):  Marland Kitchen Over the next 30-45 days, patient will work with [facility] to address needs related to medication  Interventions:  . Inter-disciplinary care team collaboration (see longitudinal plan of care) . Discussed plans with patient for ongoing care management follow up and provided patient with direct contact information for care management team . Care Guide referral for  transportation needs for medicaid . Patient called today and stated that she did not receive her synthroid in her pill packs that she received today. Marland Kitchen RNCM called [facility] [employer] and spoke with crystal and they stated that they did not receive the prescription for the medication.   . After reviewing the system the prescription had been sent to the wrong pharmacy.  RNCM asked [name]  H CMA. if she could resend the prescription to [facility] . Called and spoke with [name] they received the prescription.  They will bring it to the patient tomorrow . The patient called back an stated that she needs her inhaler refilled.  I let her know that she can call the pharmacy directly but I also sent her doctor a message regarding the refill as well.  Patient Self Care Activities:  . Patient verbalizes understanding of plan to work with pharmacy to have medication changed to pill packs . Attends all scheduled provider appointments . Calls provider office for new concerns or questions . Unable to independently self manage medication needs  Please see past updates related to this goal by clicking on the "Past Updates" button in the selected goal         Review of patient status, including review of consultants reports, relevant laboratory and other test results, and collaboration with appropriate care team members and the patient's provider was performed as part of comprehensive patient evaluation and provision of chronic care management services.    SDOH (Social Determinants of Health) assessments performed: No See Care Plan activities for detailed interventions related to SDOH)         Plan: Telephone follow up with [name] over the next 5 days.   [name] RN, BSN, [facility] Care Management Coordinator [place] Phone: [phone] Fax: [phone]

## 2020-06-27 NOTE — Patient Instructions (Signed)
Visit Information  Goals Addressed              This Visit's Progress   .  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 Care Coordination needs related to Medications in a patient with Acute Systolic Heart Failure, Hypertension, Atrial Fibrillation  . Chronic Disease Management support and education needs related to Medications in a patient with Acute Systolic Heart Failure, Hypertension, Atrial Fibrillation  . Cognitive Deficits  Nurse Case Manager Clinical Goal(s):  Marland Kitchen Over the next 30-45 days, patient will work with [facility] to address needs related to medication  Interventions:  . Inter-disciplinary care team collaboration (see longitudinal plan of care) . Discussed plans with patient for ongoing care management follow up and provided patient with direct contact information for care management team . Care Guide referral for  transportation needs for medicaid . Patient called today and stated that she did not receive her synthroid in her pill packs that she received today. Marland Kitchen RNCM called [facility] and spoke with crystal and they stated that they did not receive the prescription for the medication.   . After reviewing the system the prescription had been sent to the wrong pharmacy.  RNCM asked Brooke  Rivera CMA. if she could resend the prescription to [facility] . Called and spoke with [name] they received the prescription.  They will bring it to the patient tomorrow . The patient called back an stated that she needs her inhaler refilled.  I let her know that she can call the pharmacy directly but I also sent her doctor a message regarding the refill as well.  Patient Self Care Activities:  . Patient verbalizes understanding of plan to work with pharmacy to have medication changed to pill packs . Attends all scheduled provider appointments . Calls provider office  for new concerns or questions . Unable to independently self manage medication needs  Please see past updates related to this goal by clicking on the "Past Updates" button in the selected goal         Brooke Rivera was given information about Care Management services today including:  1. Care Management services include personalized support from designated clinical staff supervised by her physician, including individualized plan of care and coordination with other care providers 2. 24/7 contact phone numbers for assistance for urgent and routine care needs. 3. The patient may stop CCM services at any time (effective at the end of the month) by phone call to the office staff.  Patient agreed to services and verbal consent obtained.   The patient verbalized understanding of instructions provided today and declined a print copy of patient instruction materials.   Plan: Telephone follow up with Brooke Rivera over the next 5 days.   Brooke Arms RN, BSN, [facility] Care Management Coordinator [place] Phone: [phone] Fax: [phone]

## 2020-06-27 NOTE — Progress Notes (Signed)
Thank you :)

## 2020-06-30 ENCOUNTER — Ambulatory Visit: Payer: Medicare Other

## 2020-06-30 NOTE — Patient Instructions (Signed)
Visit Information  Goals Addressed              This Visit's Progress   .  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 Care Coordination needs related to Medications in a patient with Acute Systolic Heart Failure, Hypertension, Atrial Fibrillation  . Chronic Disease Management support and education needs related to Medications in a patient with Acute Systolic Heart Failure, Hypertension, Atrial Fibrillation  . Cognitive Deficits  Nurse Case Manager Clinical Goal(s):  Marland Kitchen Over the next 30-45 days, patient will work with [facility] to address needs related to medication  Interventions:  . Inter-disciplinary care team collaboration (see longitudinal plan of care) . Discussed plans with patient for ongoing care management follow up and provided patient with direct contact information for care management team . Care Guide referral for  transportation needs for medicaid . Patient stated that she received her inhaler yesterday but still did  not get her inhaler.  I called [facility] [employer] [facility] and they said when the prescription was sent to [facility] [employer] it was filled and mailed out so that can not fill it.  The patient will have to wit for it to come in the mail. . I called her back and explained the situation to her. She verbalized understanding.   . Patient Self Care Activities:  . Patient verbalizes understanding of plan to work with pharmacy to have medication changed to pill packs . Attends all scheduled provider appointments . Calls provider office for new concerns or questions . Unable to independently self manage medication needs  Please see past updates related to this goal by clicking on the "Past Updates" button in the selected goal         Ms. Mclouth was given information about Care Management services today including:  1. Care Management services include  personalized support from designated clinical staff supervised by her physician, including individualized plan of care and coordination with other care providers 2. 24/7 contact phone numbers for assistance for urgent and routine care needs. 3. The patient may stop CCM services at any time (effective at the end of the month) by phone call to the office staff.  Patient agreed to services and verbal consent obtained.   The patient verbalized understanding of instructions provided today and declined a print copy of patient instruction materials.   Plan: Telephone follow up with [name] over the next 14 days.   [name] RN, BSN, [facility] Care Management Coordinator [place] Phone: [phone] Fax: [phone]

## 2020-06-30 NOTE — Chronic Care Management (AMB) (Signed)
°   RN  Care Management   Follow Up Note   06/30/2020 Name: Brooke Rivera MRN: [id number] DOB: [birth year]  Reason for referral : Care Coordination (Medications)   Brooke Rivera is a 59 y.o. year old female who is a primary care patient of Brooke Leitz, MD. The care management team was consulted for assistance with care management and care coordination needs.    Subjective: " I still have not received my synthroid"   Assessment: Called the patient to follow up wit her to make sure she received her synthroid and inhaler.  She stated she did get the inhaler but not the synthroid.   Goals Addressed              This Visit's Progress     I have a learning disability (pt-stated)        CARE PLAN ENTRY (see longitudinal plan of care for additional care plan information)  Current Barriers:   Care Coordination needs related to Medications in a patient with Acute Systolic Heart Failure, Hypertension, Atrial Fibrillation   Chronic Disease Management support and education needs related to Medications in a patient with Acute Systolic Heart Failure, Hypertension, Atrial Fibrillation   Cognitive Deficits  Nurse Case Manager Clinical Goal(s):   Over the next 30-45 days, patient will work with [facility] to address needs related to medication  Interventions:   Inter-disciplinary care team collaboration (see longitudinal plan of care)  Discussed plans with patient for ongoing care management follow up and provided patient with direct contact information for care management team  Care Guide referral for  transportation needs for medicaid  Patient stated that she received her inhaler yesterday but still did  not get her inhaler.  I called [employer] and they said when the prescription was sent to [facility] it was filled and mailed out so that can not fill it.  The patient will have to wit for it to come in the mail.  I called her back and explained the situation to  her. She verbalized understanding.    Patient Self Care Activities:   Patient verbalizes understanding of plan to work with pharmacy to have medication changed to pill packs  Attends all scheduled provider appointments  Calls provider office for new concerns or questions  Unable to independently self manage medication needs  Please see past updates related to this goal by clicking on the "Past Updates" button in the selected goal         Review of patient status, including review of consultants reports, relevant laboratory and other test results, and collaboration with appropriate care team members and the patient's provider was performed as part of comprehensive patient evaluation and provision of chronic care management services.    SDOH (Social Determinants of Health) assessments performed: No See Care Plan activities for detailed interventions related to SDOH)         Plan: Telephone follow up with Brooke Rivera over the next 14 days.   Brooke Arms RN, BSN, [facility] Care Management Coordinator [place] Phone: [phone] Fax: [phone]

## 2020-07-01 ENCOUNTER — Ambulatory Visit: Payer: Medicare Other

## 2020-07-01 NOTE — Patient Instructions (Signed)
Visit Information  Goals Addressed              This Visit's Progress   .  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 Care Coordination needs related to Medications in a patient with Acute Systolic Heart Failure, Hypertension, Atrial Fibrillation  . Chronic Disease Management support and education needs related to Medications in a patient with Acute Systolic Heart Failure, Hypertension, Atrial Fibrillation  . Cognitive Deficits  Nurse Case Manager Clinical Goal(s):  Marland Kitchen Over the next 30-45 days, patient will work with [facility] to address needs related to medication  Interventions:  . Inter-disciplinary care team collaboration (see longitudinal plan of care) . Discussed plans with patient for ongoing care management follow up and provided patient with direct contact information for care management team . Care Guide referral for  transportation needs for medicaid . Patient stated that she received her synthroid today but it was shipped to her daughters house.  It has never been sent to her daughters house before.  Her daughter brought to her. . She understands that the only reason it went to her daughters house is because it went to the [facility] mail order. . I explained that the next time she should get her synthroid through [facility].  They have a prescription.  . Patient Self Care Activities:  . Patient verbalizes understanding of plan to work with pharmacy to have medication changed to pill packs . Attends all scheduled provider appointments . Calls provider office for new concerns or questions . Unable to independently self manage medication needs  Please see past updates related to this goal by clicking on the "Past Updates" button in the selected goal         Ms. Biel was given information about Care Management services today including:  1. Care  Management services include personalized support from designated clinical staff supervised by her physician, including individualized plan of care and coordination with other care providers 2. 24/7 contact phone numbers for assistance for urgent and routine care needs. 3. The patient may stop CCM services at any time (effective at the end of the month) by phone call to the office staff.  Patient agreed to services and verbal consent obtained.   The patient verbalized understanding of instructions provided today and declined a print copy of patient instruction materials.   Plan: Telephone follow up with [name] over the next scheduled interval.   [name] RN, BSN, [facility] Care Management Coordinator [place] Phone: [phone] Fax: [phone]

## 2020-07-01 NOTE — Chronic Care Management (AMB) (Signed)
   RN  Care Management   Follow Up Note   07/01/2020 Name: Brooke Rivera MRN: [id number] DOB: 24-Apr-1961  Reason for referral : Care Coordination (Medications)   Brooke Rivera is a 59 y.o. year old female who is a primary care patient of Brooke Leitz, MD. The care management team was consulted for assistance with care management and care coordination needs.    Subjective: " I received my Synthroid today"   Assessment: Patient called in and wanted to let me know that she received her synthroid but it went to the wrong address."   Goals Addressed              This Visit's Progress   .  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 Care Coordination needs related to Medications in a patient with Acute Systolic Heart Failure, Hypertension, Atrial Fibrillation  . Chronic Disease Management support and education needs related to Medications in a patient with Acute Systolic Heart Failure, Hypertension, Atrial Fibrillation  . Cognitive Deficits  Nurse Case Manager Clinical Goal(s):  Marland Kitchen Over the next 30-45 days, patient will work with [facility] to address needs related to medication  Interventions:  . Inter-disciplinary care team collaboration (see longitudinal plan of care) . Discussed plans with patient for ongoing care management follow up and provided patient with direct contact information for care management team . Care Guide referral for  transportation needs for medicaid . Patient stated that she received her synthroid today but it was shipped to her daughters house.  It has never been sent to her daughters house before.  Her daughter brought to her. . She understands that the only reason it went to her daughters house is because it went to the [facility] mail order. . I explained that the next time she should get her synthroid through [facility].  They have a  prescription.  . Patient Self Care Activities:  . Patient verbalizes understanding of plan to work with pharmacy to have medication changed to pill packs . Attends all scheduled provider appointments . Calls provider office for new concerns or questions . Unable to independently self manage medication needs  Please see past updates related to this goal by clicking on the "Past Updates" button in the selected goal         Review of patient status, including review of consultants reports, relevant laboratory and other test results, and collaboration with appropriate care team members and the patient's provider was performed as part of comprehensive patient evaluation and provision of chronic care management services.    SDOH (Social Determinants of Health) assessments performed: No See Care Plan activities for detailed interventions related to SDOH)         Plan: Telephone follow up with Brooke Rivera over the next scheduled interval.   Brooke Arms RN, BSN, [place] Management Coordinator [place] Phone: [phone] Fax: [phone]

## 2020-07-05 ENCOUNTER — Telehealth: Payer: Medicare Other

## 2020-07-06 ENCOUNTER — Ambulatory Visit: Payer: Medicare Other | Admitting: Licensed Clinical Social Worker

## 2020-07-06 DIAGNOSIS — Z741 Need for assistance with personal care: Secondary | ICD-10-CM

## 2020-07-06 NOTE — Chronic Care Management (AMB) (Signed)
   Social Work  Care Management Collaboration 07/06/2020 Name: Brooke Rivera MRN: [id number] DOB: 01/06/1961 Brooke Rivera is a 59 y.o. year old female who sees Carollee Leitz, MD for primary care. LCSW was consulted by PCP to assistance patient with  Level of Care Concerns for personal care sevices.  Intervention: Patient was not interviewed or contacted during this encounter.  LCSW collaborated with PCP and faxed referral to [facility].  Review of patient status, including review of consultants reports, relevant laboratory and other test results, and collaboration with appropriate care team members and the patient's provider was performed as part of comprehensive patient evaluation and provision of chronic care management services.    Plan: LCSW will f/u on referral in 7 to 10 days     Patient Care Plan: personal care services  Problem Identified: not able to meet activitied of daily living   Goal: Self-Management Plan Developed   Start Date: 07/06/2020  Expected End Date: 09/26/2020  This Visit's Progress: On track  Note:   Current Barriers:   . Patient unable to meet activities of daily living and needs assistance and support in order to meet this unmet need Clinical Goals: Over the next 30 to 45 days, patient will have personal care needs met as evident by having PCS Aide in the home assisting with needs.  Interventions : . Assessed needs, level of care concerns, basic eligibility and provided education on Personal Care Service process,  . Collaborate with primary care provider ref completing PCS referral ( Referral placed in PCP's mailbox 06/23/2020 ) . PCS referral faxed to [employer] at [phone] 07/06/2020 . LCSW will collaborate with [facility] to verify application is received and processed.  Patient Activities . Review personal care service information and list mailed  . Select 2 to 3 agencies you would like to use, keep this until your  assessment is completed by [employer] . Return calls from [facility] or call them directly if you have questions [phone] or [place], [place] / [place]   [phone] 2:56 PM

## 2020-07-15 ENCOUNTER — Telehealth: Payer: Medicare Other

## 2020-07-19 ENCOUNTER — Ambulatory Visit: Payer: Medicare Other

## 2020-07-20 ENCOUNTER — Telehealth: Payer: Self-pay | Admitting: Licensed Clinical Social Worker

## 2020-07-20 ENCOUNTER — Other Ambulatory Visit: Payer: Self-pay | Admitting: Family Medicine

## 2020-07-20 NOTE — Chronic Care Management (AMB) (Signed)
    Clinical Social Work  Care Management Outreach   07/20/2020 Name: Brooke Rivera MRN: [id number] DOB: Jan 07, 1961  Brooke Rivera is a 59 y.o. year old female who is a primary care patient of Carollee Leitz, MD .   F/U phone call to [name] today to assess needs, and progress with care plan goal for PCS.  The outreach was unsuccessful.  A HIPPA compliant phone message was left for the patient providing contact information and requesting a return call.   Plan: If no return call is received, will call again in 7  to 14 days.  Review of patient status, including review of consultants reports, relevant laboratory and other test results, and collaboration with appropriate care team members and the patient's provider was performed as part of comprehensive patient evaluation and provision of care management services.    Casimer Lanius, [place] / [place]   [phone] 1:51 PM

## 2020-07-25 ENCOUNTER — Telehealth: Payer: Medicare Other

## 2020-07-25 ENCOUNTER — Telehealth: Payer: Self-pay

## 2020-07-25 NOTE — Telephone Encounter (Signed)
     RN Outreach Note  07/25/2020 Name: Brooke Rivera MRN: [id number] DOB: August 18, 1961  Brooke Rivera is a 59 y.o. year old female who is a primary care patient of Carollee Leitz, MD.  Brooke Rivera reached out to Brooke Rivera today by phone to follow up on her medications.  Brooke Rivera  was called twice and left a message.    Follow Up Plan: If not response RNCM will reach back out to the patient within the next 7-14 days.   Brooke Arms RN, BSN, [facility] Care Management Coordinator [place] Phone: [phone] Fax: [phone]

## 2020-07-26 ENCOUNTER — Ambulatory Visit: Payer: Medicare Other

## 2020-07-26 NOTE — Patient Instructions (Signed)
Visit Information  Goals Addressed              This Visit's Progress   .  I have a learning disability (pt-stated)        Patient Self Care Activities:  . Self administers medications as prescribed . Attends all scheduled provider appointments . Calls provider office for new concerns, questions, or BP outside discussed parameters . Checks BP and records as discussed . Follows a low sodium diet/DASH diet . Patient verbalizes understanding of plan to work with pharmacy to have medication changed to pill packs        Brooke Rivera was given information about Care Management services today including:  1. Care Management services include personalized support from designated clinical staff supervised by her physician, including individualized plan of care and coordination with other care providers 2. 24/7 contact phone numbers for assistance for urgent and routine care needs. 3. The patient may stop CCM services at any time (effective at the end of the month) by phone call to the office staff.  Patient agreed to services and verbal consent obtained.   The patient verbalized understanding of instructions, educational materials, and care plan provided today and declined offer to receive copy of patient instructions, educational materials, and care plan.   The care management team will reach out to the patient again over the next 30 days.   [name] RN, BSN, [facility] Care Management Coordinator [place] Phone: [phone] I Fax: [phone]

## 2020-07-26 NOTE — Chronic Care Management (AMB) (Signed)
   RN  Care Management   Follow Up Note   07/26/2020 Name: Brooke Rivera MRN: [id number] DOB: 10-10-1960  Reason for referral : Chronic Care Management (Medications)   Brooke Rivera is a 58 y.o. year old female who is a primary care patient of Carollee Leitz, MD. The care management team was consulted for assistance with care management and care coordination needs.      Assessment: Patient called to inform me that she ha all of her medications coming form one pharmacy and she is doing well.    Patient Care Plan: RN Case Manager  Problem Identified: Medication Management   Priority: Medium  Onset Date: 03/28/2020  Note:   Current Barriers:  . Care Coordination needs related to Medications in a patient with Hypertension.  . Chronic Disease Management support and education needs related to Medications in a patient with Hypertension.  . Cognitive Deficits  Nurse Case Manager Clinical Goal(s):  Marland Kitchen Over the next 30-45 days, patient will work with [facility] to address needs related to medication  Interventions:  . Inter-disciplinary care team collaboration (see longitudinal plan of care) . Discussed plans with patient for ongoing care management follow up and provided patient with direct contact information for care management team . Patient returned my call and let me know that she has all of her medication coming from [facility].  All of her prescriptions are in pill packs.  I discussed that I will follow up with her on a monthly basis.  She needs to call me anytime she gets a new medication added to make sure the pharmacy has it and it is added in her pill packs . healthy diet promoted . pain assessed and managed . reduction of dietary sodium encouraged . medication adherence assessment completed . support and encouragement provided  Patient Self Care Activities:  . Self administers medications as prescribed . Attends all scheduled provider appointments . Calls provider  office for new concerns, questions, or BP outside discussed parameters . Checks BP and records as discussed . Follows a low sodium diet/DASH diet . Patient verbalizes understanding of plan to work with pharmacy to have medication changed to pill packs  Follow up Plan: The care management team will reach out to the patient again over the next 30 days.     Review of patient status, including review of consultants reports, relevant laboratory and other test results, and collaboration with appropriate care team members and the patient's provider was performed as part of comprehensive patient evaluation and provision of chronic care management services.    SDOH (Social Determinants of Health) assessments performed: No See Care Plan activities for detailed interventions related to SDOH)     McGregor, BSN, [place] Management Coordinator [place] Phone: [phone] Fax: [phone]

## 2020-07-28 ENCOUNTER — Telehealth: Payer: Self-pay | Admitting: Licensed Clinical Social Worker

## 2020-07-28 NOTE — Chronic Care Management (AMB) (Signed)
    Clinical Social Work  Care Management Outreach   07/28/2020 Name: Brooke Rivera MRN: [id number] DOB: [birth year]  Brooke Rivera is a 59 y.o. year old female who is a primary care patient of Carollee Leitz, MD .   F/U phone call to Brooke Rivera today to assess needs, and progress with personal care services.  The outreach was unsuccessful.  A HIPPA compliant phone message was left for the patient providing contact information and requesting a return call.   Intervention: [facility] for update.  Patient completed assessment on Nov. 15th she selected a provider but [place] unable to move forward until patient calls them back with additional information ( needs NPI number for agency).  Attempted to call patient to inform her what is needed in order to move forward with getting services in place  Plan: If no return call is received, will call again in 7 to 10 days.  Review of patient status, including review of consultants reports, relevant laboratory and other test results, and collaboration with appropriate care team members and the patient's provider was performed as part of comprehensive patient evaluation and provision of care management services.    Casimer Lanius, [place] / [place]   [phone] 10:24 AM

## 2020-08-02 ENCOUNTER — Ambulatory Visit: Payer: Medicare Other | Admitting: Licensed Clinical Social Worker

## 2020-08-02 DIAGNOSIS — Z741 Need for assistance with personal care: Secondary | ICD-10-CM

## 2020-08-02 NOTE — Patient Instructions (Signed)
  Ms. Hild  it was nice speaking with you. Please call me directly [phone] if you have questions about the goals we discussed. Goals Addressed            This Visit's Progress   . personal care services   On track    Timeframe:  Short-Term Goal Priority:  High Start Date:  06/23/20                           Expected End Date: 09/26/20                      Patient Activities . Call friend to get name of PCS agency she wants to use . Return calls from [facility] or call them directly if you have questions [phone] or [phone]        Ms. Swiney received Care Management services today:  1. Care Management services include personalized support from designated clinical staff supervised by her physician, including individualized plan of care and coordination with other care providers 2. 24/7 contact [phone] for assistance for urgent and routine care needs. 3. Care Management are voluntary services and be declined at any time by calling the office.  Patient verbalizes understanding of instructions provided today.    Follow up plan: F/U in 2 to 3 days  Maurine Cane, LCSW

## 2020-08-02 NOTE — Chronic Care Management (AMB) (Signed)
Care Management  Follow Up Clinical Social Work General Note  08/02/2020 Name: Brooke Rivera MRN: [id number] DOB: 1960-10-10  Brooke Rivera is enrolled in a Managed Medicaid plan: No. Outreach attempt today was successful.   Brooke Rivera is a 59 y.o. year old female who is a primary care patient of Brooke Leitz, MD. The Care Management team was consulted to assist the patient with Level of Care Concerns for personal care services. See care plan below for details.  SDOH (Social Determinants of Health) screening performed today:  No.  Advanced Directives Status:Not addressed in this encounter.       Patient Care Plan: LCSW Socical Work  Problem Identified: not able to meet activitied of daily living   Goal: obtain personal care services   Start Date: 07/06/2020  Expected End Date: 09/26/2020  Recent Progress: On track  Current Barriers:  Patient continue to encounter barriers with PCS. Assessment completed by [facility], however patient has not provided information for the agency she wants to use  . unable to meet activities of daily living and needs assistance and support in order to meet this unmet need Clinical Goals: Over the next 30 to 45 days, patient will have personal care needs met as evident by having PCS Aide in the home assisting with needs.  Interventions : . Assessed needs,understanding of what is needed from [facility] and barriers to progression  . LCSW spoke with [facility] to verify what patient needs to provide  Patient Activities . Call friend to get name of PCS agency she wants to use . Return calls from [facility] or call them directly if you have questions [phone] or [phone]  Plan: Patient will call LCSW if assistance is needed, if no return call LCSW will call patient in 2 to 3 days    Outpatient Encounter Medications as of 08/02/2020  Medication Sig  . albuterol (VENTOLIN HFA) 108 (90 Base) MCG/ACT inhaler Inhale  2 puffs into the lungs every 6 (six) hours as needed for wheezing or shortness of breath.  Marland Kitchen amLODipine (NORVASC) 5 MG tablet TAKE 1 TABLET BY MOUTH ONCE DAILY.  Marland Kitchen aspirin EC 81 MG tablet Take 1 tablet (81 mg total) by mouth daily.  Marland Kitchen atorvastatin (LIPITOR) 80 MG tablet TAKE 1 TABLET BY MOUTH ONCE DAILY IN THE EVENING  . levothyroxine (SYNTHROID) 100 MCG tablet Take 1 tablet (100 mcg total) by mouth daily before breakfast.  . lisinopril (ZESTRIL) 40 MG tablet Take 1 tablet (40 mg total) by mouth daily.  . nitroGLYCERIN (NITROSTAT) 0.4 MG SL tablet Place 1 tablet (0.4 mg total) under the tongue every 5 (five) minutes x 3 doses as needed for chest pain.  . ticagrelor (BRILINTA) 90 MG TABS tablet Take 1 tablet (90 mg total) by mouth 2 (two) times daily.   No facility-administered encounter medications on file as of 08/02/2020.    [name] was given information about Care Management services today including:  1. Care Management services include personalized support from designated clinical staff supervised by her physician, including individualized plan of care and coordination with other care providers 2. 24/7 contact phone numbers for assistance for urgent and routine care needs. 3. The patient may stop care management services at any time (effective at the end of the month) by phone call to the office staff.  Patient agreed to services and verbal consent obtained.   Maurine Cane, LCSW

## 2020-08-11 ENCOUNTER — Ambulatory Visit: Payer: Medicare Other | Admitting: Licensed Clinical Social Worker

## 2020-08-11 DIAGNOSIS — Z741 Need for assistance with personal care: Secondary | ICD-10-CM

## 2020-08-11 DIAGNOSIS — Z7189 Other specified counseling: Secondary | ICD-10-CM

## 2020-08-11 NOTE — Patient Instructions (Signed)
Visit Information Brooke Rivera was given information about Medicaid Managed Care team care coordination services and verbally consented to engagement with the [facility] Managed Care team.   Patient Care Plan: LCSW Socical Work  Problem Identified: not able to meet activitied of daily living   Goal: obtain personal care services Completed 08/11/2020  Start Date: 07/06/2020  Expected End Date: 09/26/2020  This Visit's Progress: On track  Recent Progress: On track  Priority: High  Assessment, progress & Current Barriers:  Patient has selected agency and is now receiving PCS services with [facility].  . unable to meet activities of daily living and needs assistance and support in order to meet this unmet need Clinical Goals: Over the next 30 to 45 days, patient will have personal care needs met as evident by having PCS Aide in the home assisting with needs.  Interventions : . Assessed needs, progress and any potential barriers to care  Patient Activities . Call [facility]  if you have questions [phone] or [phone]  Plan: Patient will call LCSW if needed, LCSW will f/u with patient in 26 to 90 days    Problem Identified: Long-Term Care Planning/ no Advance Directice in place   Long-Range Goal: Over the next 30 days, the patient will review mailed EMMI education on Advance Directive   Start Date: 08/11/2020  Expected End Date: 10/24/2020  This Visit's Progress: On track  Priority: Low  Assessment, progress & current barriers:  Patient does not have an advance directive and needs education/ support in order to meet this need Interventions: . 1:1 collaboration with Carollee Leitz, MD regarding development and update of comprehensive plan of care as evidenced by provider attestation and co-signature . Inter-disciplinary care team collaboration (see longitudinal plan of care) . Assessed understanding of Advance Directives . A voluntary discussion about advanced  care planning including importance of advanced directives, healthcare proxy and living will was discussed with the patient.  . Mailed patient EMMI educational information on Advance Directives as well as an Forensic scientist packet Patient Goals/Self-Care Activities : Over the next 30 days, patient will: . review mailed EMMI education on Advance Directive  . Discuss with her daughter  . Call LCSW with questions  Follow Up Plan: SW will follow up with patient by phone over the next 30 days      Please see education materials related to Advance directice provided as print materials.   The patient verbalized understanding of instructions provided today and agreed to receive a mailed copy of patient instruction and/or educational materials.  Licensed Clinical Social Worker will call patient in 68 days  Maurine Cane, LCSW

## 2020-08-11 NOTE — Chronic Care Management (AMB) (Signed)
Care Management   Clinical Social Work Follow UpNote  08/11/2020 Name: Brooke Rivera MRN: [id number] DOB: 1961/04/16 Brooke Rivera is a 59 y.o. year old female who sees Brooke Leitz, MD for primary care.   LCSW engaged with Brooke Rivera today by telephone for follow up visit in response to provider referral for social work case management and/or care coordination services.  Follow Up Plan: SW will follow up with patient by phone over the next 30 days    [place] for related entries     SDOH (Social Determinants of Health) assessments performed: No needs identified    Patient Care Plan: LCSW Socical Work  Problem Identified: not able to meet activitied of daily living   Goal: obtain personal care services Completed 08/11/2020  Start Date: 07/06/2020  Expected End Date: 09/26/2020  This Visit's Progress: On track  Recent Progress: On track  Priority: High  Assessment, progress & Current Barriers:  Patient has selected agency and is now receiving PCS services with [facility].  . unable to meet activities of daily living and needs assistance and support in order to meet this unmet need Clinical Goals: Over the next 30 to 45 days, patient will have personal care needs met as evident by having PCS Aide in the home assisting with needs.  Interventions : . Assessed needs, progress and any potential barriers to care  Patient Activities . Call [facility]  if you have questions [phone] or [phone]  Plan: Patient will call LCSW if needed, LCSW will f/u with patient in 23 to 90 days    Problem Identified: Long-Term Care Planning/ no Advance Directice in place   Long-Range Goal: Over the next 30 days, the patient will review mailed EMMI education on Advance Directive   Start Date: 08/11/2020  Expected End Date: 10/24/2020  This Visit's Progress: On track  Priority: Low  Assessment, progress & current barriers:  Patient  does not have an advance directive and needs education/ support in order to meet this need Interventions: . 1:1 collaboration with Brooke Leitz, MD regarding development and update of comprehensive plan of care as evidenced by provider attestation and co-signature . Inter-disciplinary care team collaboration (see longitudinal plan of care) . Assessed understanding of Advance Directives . A voluntary discussion about advanced care planning including importance of advanced directives, healthcare proxy and living will was discussed with the patient.  . Mailed patient EMMI educational information on Advance Directives as well as an Forensic scientist packet Patient Goals/Self-Care Activities : Over the next 30 days, patient will: . review mailed EMMI education on Advance Directive  . Discuss with her daughter  . Call LCSW with questions  Follow Up Plan: SW will follow up with patient by phone over the next 30 days       Outpatient Encounter Medications as of 08/11/2020  Medication Sig  . albuterol (VENTOLIN HFA) 108 (90 Base) MCG/ACT inhaler Inhale 2 puffs into the lungs every 6 (six) hours as needed for wheezing or shortness of breath.  Marland Kitchen amLODipine (NORVASC) 5 MG tablet TAKE 1 TABLET BY MOUTH ONCE DAILY.  Marland Kitchen aspirin EC 81 MG tablet Take 1 tablet (81 mg total) by mouth daily.  Marland Kitchen atorvastatin (LIPITOR) 80 MG tablet TAKE 1 TABLET BY MOUTH ONCE DAILY IN THE EVENING  . levothyroxine (SYNTHROID) 100 MCG tablet Take 1 tablet (100 mcg total) by mouth daily before breakfast.  . lisinopril (ZESTRIL) 40 MG tablet Take 1 tablet (40  mg total) by mouth daily.  . nitroGLYCERIN (NITROSTAT) 0.4 MG SL tablet Place 1 tablet (0.4 mg total) under the tongue every 5 (five) minutes x 3 doses as needed for chest pain.  . ticagrelor (BRILINTA) 90 MG TABS tablet Take 1 tablet (90 mg total) by mouth 2 (two) times daily.   No facility-administered encounter medications on file as of 08/11/2020.   Review of patient  status, including review of consultants reports, relevant laboratory and other test results, and collaboration with appropriate care team members and the patient's provider was performed as part of comprehensive patient evaluation and provision of chronic care management services.       Information about Care Management services was shared with [name] today including:  1. Care Management services include personalized support from designated clinical staff supervised by her physician, including individualized plan of care and coordination with other care providers 2. Remind patient of 24/7 contact phone numbers to provider's office for assistance with urgent and routine care needs. 3. Care Management services are voluntary and patient may stop at any time .   Patient agreed to services provided today and verbal consent obtained.     Casimer Lanius, [place] / [place]   [phone] 10:21 AM

## 2020-08-17 ENCOUNTER — Other Ambulatory Visit: Payer: Self-pay | Admitting: Cardiovascular Disease

## 2020-08-24 ENCOUNTER — Ambulatory Visit: Payer: Medicare Other

## 2020-08-25 NOTE — Chronic Care Management (AMB) (Signed)
Care Management   RN Case Manager Follow UpNote  08/25/2020 Name: Brooke Rivera MRN: [id number] DOB: 05-Jan-1961 Brooke Rivera is a 59 y.o. year old female who sees Dana Allan, MD for primary care.  Patient is enrolled in a Managed Medicaid plan: No.  The Care Management team was consulted by PCP to assist the patient with . Care Coordination.   RNCM engaged with Jackelyn Knife today Engaged with patient by telephone  in response to provider referral for RN case management and/or care coordination services. See care plan below for details during this encounter.  Follow up Plan:The care management team will reach out to the patient again over the next 30 days.    Advanced Directives Status:Not addressed in this encounter.     SDOH (Social Determinants of Health) assessments performed: No       Patient Care Plan: RN Case Manager  Problem Identified: Medication Management   Priority: Medium  Onset Date: 03/28/2020  Note:   Current Barriers:  . Care Coordination needs related to Medications in a patient with Hypertension.  . Chronic Disease Management support and education needs related to Medications in a patient with Hypertension.  . Cognitive Deficits  Nurse Case Manager Clinical Goal(s):  Marland Kitchen Over the next 30-45 days, patient will work with [facility] to address needs related to medication  Interventions:  . Inter-disciplinary care team collaboration (see longitudinal plan of care) . Discussed plans with patient for ongoing care management follow up and provided patient with direct contact information for care management team . Patient states that her medications are going great coming from the pharmacy .  She knows that if she has any changes to medications to let me know/ . healthy diet promoted . pain assessed and managed . reduction of dietary sodium encouraged . medication adherence assessment completed . support and encouragement provided  Patient Self Care Activities:   . Self administers medications as prescribed . Attends all scheduled provider appointments . Calls provider office for new concerns, questions, or BP outside discussed parameters . Checks BP and records as discussed . Follows a low sodium diet/DASH diet . Patient verbalizes understanding of plan to work with pharmacy to have medication changed to pill packs        Outpatient Encounter Medications as of 08/24/2020  Medication Sig  . albuterol (VENTOLIN HFA) 108 (90 Base) MCG/ACT inhaler Inhale 2 puffs into the lungs every 6 (six) hours as needed for wheezing or shortness of breath.  Marland Kitchen amLODipine (NORVASC) 5 MG tablet TAKE 1 TABLET BY MOUTH ONCE DAILY.  Marland Kitchen aspirin EC 81 MG tablet Take 1 tablet (81 mg total) by mouth daily.  Marland Kitchen atorvastatin (LIPITOR) 80 MG tablet TAKE 1 TABLET BY MOUTH ONCE DAILY IN THE EVENING  . BRILINTA 90 MG TABS tablet TAKE 1 TABLET BY MOUTH TWICE DAILY  . levothyroxine (SYNTHROID) 100 MCG tablet Take 1 tablet (100 mcg total) by mouth daily before breakfast.  . lisinopril (ZESTRIL) 40 MG tablet Take 1 tablet (40 mg total) by mouth daily.  . nitroGLYCERIN (NITROSTAT) 0.4 MG SL tablet Place 1 tablet (0.4 mg total) under the tongue every 5 (five) minutes x 3 doses as needed for chest pain.   No facility-administered encounter medications on file as of 08/24/2020.    Review of patient status, including review of consultants reports, relevant laboratory and other test results, and collaboration with appropriate care team members and the patient's provider was performed as part of comprehensive patient evaluation and provision of chronic  care management services.       Information about Care Management services was shared with Ms.  Gerstel today including:  1. Care Management services include personalized support from designated clinical staff supervised by her physician, including individualized plan of care and coordination with other care providers 2. Remind patient of  24/7 contact phone numbers to provider's office for assistance with urgent and routine care needs. 3. Care Management services are voluntary and patient may stop at any time .   Patient agreed to services provided today and verbal consent obtained.

## 2020-08-25 NOTE — Patient Instructions (Signed)
  Ms. All  it was nice speaking with you. Please call me directly [phone] if you have questions about the goals we discussed.  Patient Self Care Activities:  . Self administers medications as prescribed . Attends all scheduled provider appointments . Calls provider office for new concerns, questions, or BP outside discussed parameters . Checks BP and records as discussed . Follows a low sodium diet/DASH diet . Patient verbalizes understanding of plan to work with pharmacy to have medication changed to pill packs        Patient Care Plan: RN Case Manager  Problem Identified: Medication Management   Priority: Medium  Onset Date: 03/28/2020  Note:   Current Barriers:  . Care Coordination needs related to Medications in a patient with Hypertension.  . Chronic Disease Management support and education needs related to Medications in a patient with Hypertension.  . Cognitive Deficits  Nurse Case Manager Clinical Goal(s):  Marland Kitchen Over the next 30-45 days, patient will work with [facility] to address needs related to medication  Interventions:  . Inter-disciplinary care team collaboration (see longitudinal plan of care) . Discussed plans with patient for ongoing care management follow up and provided patient with direct contact information for care management team . Patient states that her medications are going great coming from the pharmacy .  She knows that if she has any changes to medications to let me know/ . healthy diet promoted . pain assessed and managed . reduction of dietary sodium encouraged . medication adherence assessment completed . support and encouragement provided  Follow up Plan: The care management team will reach out to the patient again over the next 30 days.       Ms. Doolan received Care Management services today:  1. Care Management services include personalized support from designated clinical staff supervised by her physician, including individualized plan of  care and coordination with other care providers 2. 24/7 contact [phone] for assistance for urgent and routine care needs. 3. Care Management are voluntary services and be declined at any time by calling the office.  The patient verbalized understanding of instructions provided today and declined a print copy of patient instruction materials.    Juanell Fairly, RN

## 2020-09-21 ENCOUNTER — Ambulatory Visit: Payer: Medicare Other

## 2020-09-21 NOTE — Chronic Care Management (AMB) (Signed)
Care Management    RN Visit Note  09/21/2020 Name: Brooke Rivera MRN: [id number] DOB: 10-14-1960  Subjective: Brooke Rivera is a 60 y.o. year old female who is a primary care patient of Brooke Leitz, MD. The care management team was consulted for assistance with disease management and care coordination needs.    Engaged with patient by telephone for follow up visit in response to provider referral for case management and/or care coordination services.   Consent to Services:   Brooke Rivera was given information about Care Management services today including:  1. Care Management services includes personalized support from designated clinical staff supervised by her physician, including individualized plan of care and coordination with other care providers 2. 24/7 contact phone numbers for assistance for urgent and routine care needs. 3. The patient may stop case management services at any time by phone call to the office staff.  Patient agreed to services and consent obtained.    Assessment: Patient continues to recieve prepackaged medications from pharmacy. See Care Plan below for interventions and patient self-care actives. Follow up Plan: Patient would like continued follow-up.  CCM RNCM will outreach to patient within the next 30 days. Patient will call office if needed prior to next encounter  Review of patient past medical history, allergies, medications, health status, including review of consultants reports, laboratory and other test data, was performed as part of comprehensive evaluation and provision of chronic care management services.   SDOH (Social Determinants of Health) assessments and interventions performed:    Care Plan  Allergies  Allergen Reactions  . Cabbage Itching and Swelling    Facial redness    Outpatient Encounter Medications as of 09/21/2020  Medication Sig  . albuterol (VENTOLIN HFA) 108 (90 Base) MCG/ACT inhaler Inhale 2 puffs into the lungs every 6  (six) hours as needed for wheezing or shortness of breath.  Marland Kitchen amLODipine (NORVASC) 5 MG tablet TAKE 1 TABLET BY MOUTH ONCE DAILY.  Marland Kitchen aspirin EC 81 MG tablet Take 1 tablet (81 mg total) by mouth daily.  Marland Kitchen atorvastatin (LIPITOR) 80 MG tablet TAKE 1 TABLET BY MOUTH ONCE DAILY IN THE EVENING  . BRILINTA 90 MG TABS tablet TAKE 1 TABLET BY MOUTH TWICE DAILY  . levothyroxine (SYNTHROID) 100 MCG tablet Take 1 tablet (100 mcg total) by mouth daily before breakfast.  . lisinopril (ZESTRIL) 40 MG tablet Take 1 tablet (40 mg total) by mouth daily.  . nitroGLYCERIN (NITROSTAT) 0.4 MG SL tablet Place 1 tablet (0.4 mg total) under the tongue every 5 (five) minutes x 3 doses as needed for chest pain.   No facility-administered encounter medications on file as of 09/21/2020.    Patient Active Problem List   Diagnosis Date Noted  . Healthcare maintenance 06/25/2020  . STEMI involving right coronary artery ([place]) 05/17/2019  . Abnormal mammogram 11/29/2017  . Loss of weight 11/22/2017  . Bilateral leg cramps 09/03/2016  . Hypothyroidism 11/02/2014  . [place] 01/01/2014  . GERD (gastroesophageal reflux disease) 04/04/2011  . Hypertension, benign 01/03/2011  . Hyperlipidemia 06/06/2010  . TOBACCO USER 06/06/2010  . Depressive disorder 06/06/2010  . LEARNING DISABILITY 06/06/2010  . HOT FLASHES 06/06/2010    Conditions to be addressed/monitored: Medication Management  Care Plan : RN Case Manager  Updates made by [name], RN since 09/21/2020 12:00 AM  Problem: Medication Management   Priority: Medium  Onset Date: 03/28/2020  Current Barriers:  . Care Coordination needs related to Medications in a patient with Hypertension.  Marland Kitchen  Chronic Disease Management support and education needs related to Medications in a patient with Hypertension.  . Cognitive Deficits  Nurse Case Manager Clinical Goal(s):  Marland Kitchen Over the next 30-45 days, patient will work with [facility] to address needs related to  medication  Interventions:  . Inter-disciplinary care team collaboration (see longitudinal plan of care) . Discussed plans with patient for ongoing care management follow up and provided patient with direct contact information for care management team . Patient states that she continues to receive her medications prepackaged from the pharmacy and reports compliance with her medications daily.  Does state that she has received multiple packs of medications from the pharmacy and has an abundance of the prepackaged medications. Patient under assumption she gets 2 weeks medications at a time.  RNCM contacted pharmacy and confirmed patient recieves 4 weeks medications at a time.  Recontacted patient and reviewed medications, when to take, and how frequently she recieves from pharmacy.  Patient stated her understanding and knows that if she has any changes to medications to let me know. . healthy diet promoted . pain assessed and managed . reduction of dietary sodium encouraged . medication adherence assessment completed . support and encouragement provided  Patient Self Care Activities:  . Self administers medications as prescribed . Attends all scheduled provider appointments . Calls provider office for new concerns, questions, or BP outside discussed parameters . Checks BP and records as discussed . Follows a low sodium diet/DASH diet . Patient verbalizes understanding of plan to work with pharmacy to have medication changed to pill packs       [name] RN, BSN, [place] Phone: [phone] I Fax: [phone]

## 2020-09-21 NOTE — Patient Instructions (Signed)
Visit Information  Goals Addressed              This Visit's Progress     I have a learning disability (pt-stated)   On track     Timeframe:  Short-Term Goal Priority:  High Start Date:   03/28/2020                          Expected End Date:    10/24/20                    Patient Self Care Activities:   Self administers medications as prescribed  Attends all scheduled provider appointments  Calls provider office for new concerns, questions, or BP outside discussed parameters  Checks BP and records as discussed  Follows a low sodium diet/DASH diet  Patient verbalizes understanding of plan to work with pharmacy to have medication changed to pill packs  Continue to use prepackaged pill packs to help with medication compliance        The patient verbalized understanding of instructions, educational materials, and care plan provided today and declined offer to receive copy of patient instructions, educational materials, and care plan.   Follow up Plan: Patient would like continued follow-up.  CCM RNCM will outreach to patient within the next 30 days. Patient will call office if needed prior to next encounter   [name] RN, BSN, [facility] Care Management Coordinator [place] Phone: [phone] I Fax: [phone]

## 2020-09-23 ENCOUNTER — Ambulatory Visit: Payer: Medicare Other | Admitting: Licensed Clinical Social Worker

## 2020-09-23 DIAGNOSIS — Z7189 Other specified counseling: Secondary | ICD-10-CM

## 2020-09-23 NOTE — Chronic Care Management (AMB) (Signed)
Care Management Clinical Social Work Note  09/23/2020 Name: Brooke Rivera MRN: [id number] DOB: [birth year]  Brooke Rivera is a 60 y.o. year old female who is a primary care patient of Carollee Leitz, MD.  The Care Management team was consulted for assistance with chronic disease management and coordination needs.  Engaged with patient by telephone for follow up visit in response to provider referral for social work chronic care management and care coordination service.  Patient agreed to services and consent obtained.   Assessment: Patient no longer wishes to work on goal or having an advance directive. Goal deleted.. Follow up Plan: Patient does not require or desire continued follow-up. Will contact the office if needed LCSW will continue to collaborate with CCM RN for ongoing needs.       Review of patient past medical history, allergies, medications, and health status, including review of relevant consultants reports was performed today as part of a comprehensive evaluation and provision of chronic care management and care coordination services.  SDOH (Social Determinants of Health) assessments and interventions performed:    Advanced Directives Status: Not ready or willing to discuss.  Care Plan  Allergies  Allergen Reactions  . Cabbage Itching and Swelling    Facial redness    Outpatient Encounter Medications as of 09/23/2020  Medication Sig  . albuterol (VENTOLIN HFA) 108 (90 Base) MCG/ACT inhaler Inhale 2 puffs into the lungs every 6 (six) hours as needed for wheezing or shortness of breath.  Marland Kitchen amLODipine (NORVASC) 5 MG tablet TAKE 1 TABLET BY MOUTH ONCE DAILY.  Marland Kitchen aspirin EC 81 MG tablet Take 1 tablet (81 mg total) by mouth daily.  Marland Kitchen atorvastatin (LIPITOR) 80 MG tablet TAKE 1 TABLET BY MOUTH ONCE DAILY IN THE EVENING  . BRILINTA 90 MG TABS tablet TAKE 1 TABLET BY MOUTH TWICE DAILY  . levothyroxine (SYNTHROID) 100 MCG tablet Take 1 tablet (100 mcg total) by mouth daily before  breakfast.  . lisinopril (ZESTRIL) 40 MG tablet Take 1 tablet (40 mg total) by mouth daily.  . nitroGLYCERIN (NITROSTAT) 0.4 MG SL tablet Place 1 tablet (0.4 mg total) under the tongue every 5 (five) minutes x 3 doses as needed for chest pain.   No facility-administered encounter medications on file as of 09/23/2020.    Patient Active Problem List   Diagnosis Date Noted  . Healthcare maintenance 06/25/2020  . STEMI involving right coronary artery ([place]) 05/17/2019  . Abnormal mammogram 11/29/2017  . Loss of weight 11/22/2017  . Bilateral leg cramps 09/03/2016  . Hypothyroidism 11/02/2014  . Scotoma 01/01/2014  . GERD (gastroesophageal reflux disease) 04/04/2011  . Hypertension, benign 01/03/2011  . Hyperlipidemia 06/06/2010  . TOBACCO USER 06/06/2010  . Depressive disorder 06/06/2010  . LEARNING DISABILITY 06/06/2010  . HOT FLASHES 06/06/2010    Conditions to be addressed/monitored: LCSW has none at this time;  There are no care plans that you recently modified to display for this patient.   Maurine Cane, LCSW

## 2020-10-18 ENCOUNTER — Ambulatory Visit: Payer: Medicare Other

## 2020-10-19 NOTE — Patient Instructions (Signed)
Visit Information  Ms. Bickert  it was nice speaking with you. Please call me directly [phone] if you have questions about the goals we discussed.  Goals Addressed              This Visit's Progress   .  I have a learning disability (pt-stated)        Timeframe:  Short-Term Goal Priority:  High Start Date:   03/28/2020                          Expected End Date:  01/24/21              Patient Self Care Activities:  . Self administers medications as prescribed . Attends all scheduled provider appointments . Calls provider office for new concerns, questions, or BP outside discussed parameters . Checks BP and records as discussed . Follows a low sodium diet/DASH diet . Patient verbalizes understanding of plan to work with pharmacy to have medication changed to pill packs . Continue to use prepackaged pill packs to help with medication compliance        The patient verbalized understanding of instructions, educational materials, and care plan provided today and declined offer to receive copy of patient instructions, educational materials, and care plan.   Follow up Plan: Patient would like continued follow-up.  CCM RNCM will  will outreach the patient in April after her appointment with her PCP at patient request. Patient will call office if needed prior to next encounter. Patient will call office if needed prior to next encounter  [name], RN

## 2020-10-19 NOTE — Chronic Care Management (AMB) (Signed)
Care Management    RN Visit Note  10/19/2020 Name: Brooke Rivera MRN: [id number] DOB: 05-30-1961  Subjective: Brooke Rivera is a 60 y.o. year old female who is a primary care patient of Carollee Leitz, MD. The care management team was consulted for assistance with disease management and care coordination needs.    Engaged with patient by telephone for follow up visit in response to provider referral for case management and/or care coordination services.   Consent to Services:   Ms. Crespi was given information about Care Management services today including:  1. Care Management services includes personalized support from designated clinical staff supervised by her physician, including individualized plan of care and coordination with other care providers 2. 24/7 contact phone numbers for assistance for urgent and routine care needs. 3. The patient may stop case management services at any time by phone call to the office staff.  Patient agreed to services and consent obtained.    Assessment: Patient is making progress with her prepackaged medications. . See Care Plan below for interventions and patient self-care actives. Follow up Plan: Patient would like continued follow-up.  CCM RNCM will outreach the patient in April after her appointment with her PCP at patient request.. Patient will call office if needed prior to next encounter : Review of patient past medical history, allergies, medications, health status, including review of consultants reports, laboratory and other test data, was performed as part of comprehensive evaluation and provision of chronic care management services.   SDOH (Social Determinants of Health) assessments and interventions performed:    Care Plan  Allergies  Allergen Reactions  . Cabbage Itching and Swelling    Facial redness    Outpatient Encounter Medications as of 10/18/2020  Medication Sig  . albuterol (VENTOLIN HFA) 108 (90 Base) MCG/ACT inhaler  Inhale 2 puffs into the lungs every 6 (six) hours as needed for wheezing or shortness of breath.  Marland Kitchen amLODipine (NORVASC) 5 MG tablet TAKE 1 TABLET BY MOUTH ONCE DAILY.  Marland Kitchen aspirin EC 81 MG tablet Take 1 tablet (81 mg total) by mouth daily.  Marland Kitchen atorvastatin (LIPITOR) 80 MG tablet TAKE 1 TABLET BY MOUTH ONCE DAILY IN THE EVENING  . BRILINTA 90 MG TABS tablet TAKE 1 TABLET BY MOUTH TWICE DAILY  . levothyroxine (SYNTHROID) 100 MCG tablet Take 1 tablet (100 mcg total) by mouth daily before breakfast.  . lisinopril (ZESTRIL) 40 MG tablet Take 1 tablet (40 mg total) by mouth daily.  . nitroGLYCERIN (NITROSTAT) 0.4 MG SL tablet Place 1 tablet (0.4 mg total) under the tongue every 5 (five) minutes x 3 doses as needed for chest pain.   No facility-administered encounter medications on file as of 10/18/2020.    Patient Active Problem List   Diagnosis Date Noted  . Healthcare maintenance 06/25/2020  . STEMI involving right coronary artery ([place]) 05/17/2019  . Abnormal mammogram 11/29/2017  . Loss of weight 11/22/2017  . Bilateral leg cramps 09/03/2016  . Hypothyroidism 11/02/2014  . Scotoma 01/01/2014  . GERD (gastroesophageal reflux disease) 04/04/2011  . Hypertension, benign 01/03/2011  . Hyperlipidemia 06/06/2010  . TOBACCO USER 06/06/2010  . Depressive disorder 06/06/2010  . LEARNING DISABILITY 06/06/2010  . HOT FLASHES 06/06/2010    Conditions to be addressed/monitored: Medication Management  Care Plan : RN Case Manager  Updates made by [name], RN since 10/19/2020 12:00 AM  Problem: Medication Management   Priority: Medium  Onset Date: 03/28/2020  Current Barriers:  . Care Coordination needs related to  Medications in a patient with Hypertension.  . Chronic Disease Management support and education needs related to Medications in a patient with Hypertension.  . Cognitive Deficits  Nurse Case Manager Clinical Goal(s):  Marland Kitchen Over the next 30-45 days, patient will work with [facility] to address  needs related to medication  Interventions:  . Inter-disciplinary care team collaboration (see longitudinal plan of care) . Discussed plans with patient for ongoing care management follow up and provided patient with direct contact information for care management team . Patient states that she continues to receive her medications prepackaged from the pharmacy and reports compliance with her medications daily. She has an appointment scheduled in April and the patient would like for me to follow up with her after appointment to make sure she does n ot have any medication changes. . healthy diet promoted . pain assessed and managed . reduction of dietary sodium encouraged . medication adherence assessment completed . support and encouragement provided  Patient Self Care Activities:  . Self administers medications as prescribed . Attends all scheduled provider appointments . Calls provider office for new concerns, questions, or BP outside discussed parameters . Checks BP and records as discussed . Follows a low sodium diet/DASH diet . Patient verbalizes understanding of plan to work with pharmacy to have medication changed to pill packs       [name] RN, BSN, [place] Phone: [phone] I Fax: [phone]

## 2020-11-11 ENCOUNTER — Other Ambulatory Visit: Payer: Self-pay | Admitting: Family Medicine

## 2020-11-29 ENCOUNTER — Ambulatory Visit: Payer: Medicare Other | Admitting: Family Medicine

## 2020-12-02 ENCOUNTER — Ambulatory Visit: Payer: Self-pay | Admitting: Licensed Clinical Social Worker

## 2020-12-02 ENCOUNTER — Telehealth: Payer: Medicare Other

## 2020-12-02 ENCOUNTER — Ambulatory Visit: Payer: Medicare HMO

## 2020-12-02 NOTE — Chronic Care Management (AMB) (Signed)
Care Management  Collaboration  Note  12/02/2020 Name: Brooke Rivera MRN: [id number] DOB: 10/27/1960  Brooke Rivera is a 60 y.o. year old female who is a primary care patient of Carollee Leitz, MD. The CCM team was consulted reference care coordination needs.  Intervention: Patient was not interviewed or contacted during this encounter.   CCM LCSW collaborated with CCM RN .  Conducted brief assessment, recommendations and relevant information discussed.  Follow up Plan: Patient does not require or desire continued follow-up by LCSW. She does not desire to work on advance directive goal.  She will contact the office if needed.   LCSW will disconnect from care team.  CCM RN will inform LCSW if there are additional needs.  Collaboration with Carollee Leitz, MD regarding development and update of comprehensive plan of care as evidenced by provider attestation and co-signature Review of patient past medical history, allergies, medications, and health status, including review of pertinent consultant reports was performed as part of comprehensive evaluation and provision of care management/care coordination services.   Care Plan Conditions to be addressed/monitored per PCP order: See Care plan for CCM RN. Patient Care Plan: LCSW Socical Work  Problem Identified: not able to meet activitied of daily living   Goal: obtain personal care services Completed 08/11/2020  Start Date: 07/06/2020  Expected End Date: 09/26/2020  This Visit's Progress: On track  Recent Progress: On track  Priority: High  Assessment, progress & Current Barriers:  Patient has selected agency and is now receiving PCS services with [facility].  . unable to meet activities of daily living and needs assistance and support in order to meet this unmet need Clinical Goals: Over the next 30 to 45 days, patient will have personal care needs met as evident by having PCS Aide in the home assisting with needs.   Interventions : . Assessed needs, progress and any potential barriers to care  Patient Activities . Call [facility]  if you have questions [phone] or [phone]  Plan: Patient will call LCSW if needed, LCSW will f/u with patient in 92 to 90 days    Problem Identified: Long-Term Care Planning/ no Advance Directice in place   Patient Care Plan: RN Case Manager  Problem Identified: Medication Management   Priority: Medium  Onset Date: 03/28/2020  Current Barriers:  . Care Coordination needs related to Medications in a patient with Hypertension.  . Chronic Disease Management support and education needs related to Medications in a patient with Hypertension.  . Cognitive Deficits  Nurse Case Manager Clinical Goal(s):  Marland Kitchen Over the next 30-45 days, patient will work with [facility] to address needs related to medication  Interventions:  . Inter-disciplinary care team collaboration (see longitudinal plan of care) . Discussed plans with patient for ongoing care management follow up and provided patient with direct contact information for care management team . Patient states that she continues to receive her medications prepackaged from the pharmacy and reports compliance with her medications daily. She has an appointment scheduled in April and the patient would like for me to follow up with her after appointment to make sure she does n ot have any medication changes. . healthy diet promoted . pain assessed and managed . reduction of dietary sodium encouraged . medication adherence assessment completed . support and encouragement provided  Patient Self Care Activities:  . Self administers medications as prescribed . Attends all scheduled provider appointments . Calls provider office for new concerns, questions, or BP  outside discussed parameters . Checks BP and records as discussed . Follows a low sodium diet/DASH diet . Patient verbalizes understanding of plan to work with pharmacy  to have medication changed to pill packs      Casimer Lanius, [place] / [place]   [phone] 10:59 AM

## 2020-12-03 NOTE — Chronic Care Management (AMB) (Signed)
Care Management    RN Visit Note  12/03/2020 Name: Brooke Rivera MRN: [id number] DOB: 29-Jul-1961  Subjective: Brooke Rivera is a 60 y.o. year old female who is a primary care patient of Brooke Leitz, MD. The care management team was consulted for assistance with disease management and care coordination needs.    Engaged with patient by telephone for follow up visit in response to provider referral for case management and/or care coordination services.   Consent to Services:   Brooke Rivera was given information about Care Management services today including:  1. Care Management services includes personalized support from designated clinical staff supervised by her physician, including individualized plan of care and coordination with other care providers 2. 24/7 contact phone numbers for assistance for urgent and routine care needs. 3. The patient may stop case management services at any time by phone call to the office staff.  Patient agreed to services and consent obtained.    Assessment: Patient has a discrepancy with her pill packs.. See Care Plan below for interventions and patient self-care actives. Follow up Plan: Patient would like continued follow-up.  CCM RNCM will outreach the patient within the next 7 days.  Patient will call office if needed prior to next encounter  Review of patient past medical history, allergies, medications, health status, including review of consultants reports, laboratory and other test data, was performed as part of comprehensive evaluation and provision of chronic care management services.   SDOH (Social Determinants of Health) assessments and interventions performed:    Care Plan  Allergies  Allergen Reactions  . Cabbage Itching and Swelling    Facial redness    Outpatient Encounter Medications as of 12/02/2020  Medication Sig  . albuterol (VENTOLIN HFA) 108 (90 Base) MCG/ACT inhaler Inhale 2 puffs into the lungs every 6 (six) hours as needed  for wheezing or shortness of breath.  Marland Kitchen amLODipine (NORVASC) 5 MG tablet TAKE 1 TABLET BY MOUTH ONCE DAILY.  Marland Kitchen aspirin EC 81 MG tablet Take 1 tablet (81 mg total) by mouth daily.  Marland Kitchen atorvastatin (LIPITOR) 80 MG tablet TAKE 1 TABLET BY MOUTH ONCE DAILY IN THE EVENING  . BRILINTA 90 MG TABS tablet TAKE 1 TABLET BY MOUTH TWICE DAILY  . levothyroxine (SYNTHROID) 100 MCG tablet TAKE 1 TABLET BY MOUTH ONCE DAILY BEFORE BREAKFAST.  Marland Kitchen lisinopril (ZESTRIL) 40 MG tablet Take 1 tablet (40 mg total) by mouth daily.  . nitroGLYCERIN (NITROSTAT) 0.4 MG SL tablet Place 1 tablet (0.4 mg total) under the tongue every 5 (five) minutes x 3 doses as needed for chest pain.   No facility-administered encounter medications on file as of 12/02/2020.    Patient Active Problem List   Diagnosis Date Noted  . Healthcare maintenance 06/25/2020  . STEMI involving right coronary artery ([place]) 05/17/2019  . Abnormal mammogram 11/29/2017  . Loss of weight 11/22/2017  . Bilateral leg cramps 09/03/2016  . Hypothyroidism 11/02/2014  . Scotoma 01/01/2014  . GERD (gastroesophageal reflux disease) 04/04/2011  . Hypertension, benign 01/03/2011  . Hyperlipidemia 06/06/2010  . TOBACCO USER 06/06/2010  . Depressive disorder 06/06/2010  . LEARNING DISABILITY 06/06/2010  . HOT FLASHES 06/06/2010    Conditions to be addressed/monitored: Medication Management  Care Plan : RN Case Manager  Updates made by Brooke Arms, RN since 12/03/2020 12:00 AM  Problem: Medication Management   Priority: Medium  Onset Date: 03/28/2020  Current Barriers:  . Care Coordination needs related to Medications in a patient with Hypertension.  Marland Kitchen  Chronic Disease Management support and education needs related to Medications in a patient with Hypertension.  . Cognitive Deficits  Nurse Case Manager Clinical Goal(s):  Marland Kitchen Over the next 30-45 days, patient will work with [facility] to address needs related to medication  Interventions:  . Inter-disciplinary  care team collaboration (see longitudinal plan of care) . Discussed plans with patient for ongoing care management follow up and provided patient with direct contact information for care management team . Spoke with the patient and she states that in the last 2 weeks that she has a discrepancy with her pill packs that she has received.  RNCM called and spoke with Brooke Rivera at [facility] [phone] with [name] on the line.  Brooke Rivera stated that no changes had been made to her medications.  The patient has an appointment on 12/06/20 with her PCP and asked if she could bring her medications with her.   . healthy diet promoted . pain assessed and managed . reduction of dietary sodium encouraged . medication adherence assessment completed . support and encouragement provided  Patient Self Care Activities:  . Self administers medications as prescribed . Attends all scheduled provider appointments . Calls provider office for new concerns, questions, or BP outside discussed parameters . Checks BP and records as discussed . Follows a low sodium diet/DASH diet . Patient verbalizes understanding of plan to work with pharmacy to have medication changed to pill packs       Brooke Arms RN, BSN, Brooke Rivera Phone: [phone] I Fax: [phone]

## 2020-12-03 NOTE — Patient Instructions (Signed)
Visit Information  Ms. Bielak  it was nice speaking with you. Please call me directly [phone] if you have questions about the goals we discussed.  Goals Addressed              This Visit's Progress   .  I have a learning disability (pt-stated)        Timeframe:  Short-Term Goal Priority:  High Start Date:   03/28/2020                          Expected End Date:  02/23/21          Patient Self Care Activities:  . Self administers medications as prescribed . Attends all scheduled provider appointments . Calls provider office for new concerns, questions, or BP outside discussed parameters . Checks BP and records as discussed . Follows a low sodium diet/DASH diet . Patient verbalizes understanding of plan to work with pharmacy to have medication changed to pill packs . Continue to use prepackaged pill packs to help with medication compliance        The patient verbalized understanding of instructions, educational materials, and care plan provided today and declined offer to receive copy of patient instructions, educational materials, and care plan.   Follow up Plan: Patient would like continued follow-up.  CCM RNCM  will outreach the patient within the next 7 days.  Patient will call office if needed prior to next encounter  [name], RN  [phone]

## 2020-12-06 ENCOUNTER — Telehealth: Payer: Self-pay

## 2020-12-06 ENCOUNTER — Telehealth: Payer: Medicare HMO

## 2020-12-06 ENCOUNTER — Ambulatory Visit: Payer: Medicare HMO | Admitting: Family Medicine

## 2020-12-06 NOTE — Telephone Encounter (Signed)
  Care Management     RN Outreach Note  12/06/2020 Name: Brooke Rivera MRN: [id number] DOB: 07-08-1961  Brooke Rivera is a 60 y.o. year old female who is a primary care patient of Carollee Leitz, MD . [name] reached out to [name] today by phone to discuss appointment for medication review on Thursday 12/07/18.  The patient states that she will not be able to come to the appointment on Thursday 12/06/20.  Appointment cancelled and the patient states that she will call to reschedule.    Follow up Plan: RNCM will wait for the patient to call for follow up.  If no responce within 90 days RNCM will do a check in and if no further needs RNCM  will disconnect from the care team.   [name] RN, BSN, [facility] Care Management Coordinator [place] Phone: [phone] I Fax: [phone]

## 2020-12-08 ENCOUNTER — Ambulatory Visit: Payer: Medicare HMO | Admitting: Pharmacist

## 2020-12-08 ENCOUNTER — Ambulatory Visit: Payer: Medicare HMO

## 2021-01-09 ENCOUNTER — Other Ambulatory Visit: Payer: Self-pay | Admitting: Family Medicine

## 2021-01-17 ENCOUNTER — Ambulatory Visit: Payer: Medicare HMO

## 2021-01-18 ENCOUNTER — Ambulatory Visit (INDEPENDENT_AMBULATORY_CARE_PROVIDER_SITE_OTHER): Payer: Medicare HMO

## 2021-01-18 DIAGNOSIS — Z0000 Encounter for general adult medical examination without abnormal findings: Secondary | ICD-10-CM | POA: Diagnosis not present

## 2021-01-18 NOTE — Progress Notes (Addendum)
Subjective:   Brooke Rivera is a 60 y.o. female who presents for Medicare Annual (Subsequent) preventive examination.  Patient consented to have virtual visit and was identified by name and date of birth. Method of visit: Telephone  Encounter participants: Patient: Brooke Rivera - located at Home Nurse/Provider: Dorna Bloom - located at [facility]  Others (if applicable): NA  Review of Systems: Defer to PCP.  Cardiac Risk Factors include: hypertension;sedentary lifestyle;smoking/ tobacco exposure  Objective:   Vitals: There were no vitals taken for this visit.  There is no height or weight on file to calculate BMI.  Advanced Directives 01/18/2021 06/23/2020 04/11/2020 03/23/2020 05/17/2019 05/17/2019 03/10/2018  Does Patient Have a Medical Advance Directive? No No No No No No No  Would patient like information on creating a medical advance directive? No - Patient declined No - Patient declined No - Patient declined No - Patient declined No - Patient declined No - Guardian declined No - Patient declined   Tobacco Social History   Tobacco Use  Smoking Status Current Every Day Smoker  . Packs/day: 0.25  . Types: Cigarettes  Smokeless Tobacco Never Used  Tobacco Comment   4-5 cigs a day      Ready to quit: No Counseling given: Yes Comment: 4-5 cigs a day   Clinical Intake:  Pre-visit preparation completed: Yes  Pain Score: 0-No pain  How often do you need to have someone help you when you read instructions, pamphlets, or other written materials from your doctor or pharmacy?: 2 - Rarely What is the last grade level you completed in school?: 10th grade  Past Medical History:  Diagnosis Date  . Acute systolic heart failure ([place]) 05/21/2019  . Allergy    mild   . Assistance needed at home 03/25/2020  . Cocaine use   . Depression    rx not taking at this time  . Dry skin 11/22/2017  . Goiter, lymphadenoid    thyroid  . Hx of adenomatous polyp of colon 05/26/2017  . Hx of  adenomatous polyp of colon 05/26/2017   04/2017 diminutive adenoma and diminutive hyperplastic polyp recall 5 yrs 2023 Gatha Mayer, MD, [facility]   . Hyperlipidemia   . Hypertension   . STEMI (ST elevation myocardial infarction) ([place])   . Systolic heart failure ([place])   . Ventricular fibrillation [facility])    Past Surgical History:  Procedure Laterality Date  . COLONOSCOPY  04/2017   1 adenoma  . CORONARY STENT INTERVENTION N/A 05/19/2019   Procedure: CORONARY STENT INTERVENTION;  Surgeon: Leonie Man, MD;  Location: [place] [facility];  Service: Cardiovascular;  Laterality: N/A;  . CORONARY/GRAFT ACUTE MI REVASCULARIZATION N/A 05/17/2019   Procedure: Coronary/Graft Acute MI Revascularization;  Surgeon: Troy Sine, MD;  Location: [place] [facility];  Service: Cardiovascular;  Laterality: N/A;  . LEFT HEART CATH AND CORONARY ANGIOGRAPHY N/A 05/19/2019   Procedure: LEFT HEART CATH AND CORONARY ANGIOGRAPHY;  Surgeon: Leonie Man, MD;  Location: [place] [facility];  Service: Cardiovascular;  Laterality: N/A;  . LEFT HEART CATH AND CORONARY ANGIOGRAPHY N/A 05/17/2019   Procedure: LEFT HEART CATH AND CORONARY ANGIOGRAPHY;  Surgeon: Troy Sine, MD;  Location: [place] [facility];  Service: Cardiovascular;  Laterality: N/A;  . THYROIDECTOMY Bilateral 07/08/2015   Procedure: BILATERAL THYROIDECTOMY;  Surgeon: Ruby Cola, MD;  Location: [facility];  Service: ENT;  Laterality: Bilateral;   Family History  Problem Relation Age of Onset  . Hyperlipidemia Mother   .  Cancer Mother   . Cancer Father   . Colon cancer Neg Hx   . Colon polyps Neg Hx   . Esophageal cancer Neg Hx   . Rectal cancer Neg Hx   . Stomach cancer Neg Hx    Social History   Socioeconomic History  . Marital status: Single    Spouse name: Not on file  . Number of children: 1  . Years of education: 59  . Highest education level: 10th grade  Occupational History  . Occupation: disability   Tobacco Use  . Smoking  status: Current Every Day Smoker    Packs/day: 0.25    Types: Cigarettes  . Smokeless tobacco: Never Used  . Tobacco comment: 4-5 cigs a day   Substance and Sexual Activity  . Alcohol use: Yes    Alcohol/week: 0.0 standard drinks    Comment: last occurence 3 weeks ago  . Drug use: No  . Sexual activity: Not Currently    Birth control/protection: Post-menopausal  Other Topics Concern  . Not on file  Social History Narrative   Patient lives alone in [place].    Patient has never been married and has one daughter.    Her daughter lives in [place].    Patient enjoys watching TV and spending time with her family.    Social Determinants of Health   Financial Resource Strain: Low Risk   . Difficulty of Paying Living Expenses: Not very hard  Food Insecurity: No Food Insecurity  . Worried About Charity fundraiser in the Last Year: Never true  . Ran Out of Food in the Last Year: Never true  Transportation Needs: Unmet Transportation Needs  . Lack of Transportation (Medical): Yes  . Lack of Transportation (Non-Medical): No  Physical Activity: Inactive  . Days of Exercise per Week: 0 days  . Minutes of Exercise per Session: 0 min  Stress: Stress Concern Present  . Feeling of Stress : To some extent  Social Connections: Socially Isolated  . Frequency of Communication with Friends and Family: Twice a week  . Frequency of Social Gatherings with Friends and Family: Twice a week  . Attends Religious Services: Never  . Active Member of Clubs or Organizations: No  . Attends Archivist Meetings: Never  . Marital Status: Never married   Outpatient Encounter Medications as of 01/18/2021  Medication Sig  . albuterol (VENTOLIN HFA) 108 (90 Base) MCG/ACT inhaler Inhale 2 puffs into the lungs every 6 (six) hours as needed for wheezing or shortness of breath.  Marland Kitchen amLODipine (NORVASC) 5 MG tablet TAKE 1 TABLET BY MOUTH ONCE DAILY.  Marland Kitchen aspirin EC 81 MG tablet Take 1 tablet (81 mg total)  by mouth daily.  Marland Kitchen atorvastatin (LIPITOR) 80 MG tablet TAKE 1 TABLET BY MOUTH ONCE DAILY IN THE EVENING  . BRILINTA 90 MG TABS tablet TAKE 1 TABLET BY MOUTH TWICE DAILY  . levothyroxine (SYNTHROID) 100 MCG tablet TAKE 1 TABLET BY MOUTH ONCE DAILY BEFORE BREAKFAST.  Marland Kitchen lisinopril (ZESTRIL) 40 MG tablet TAKE 1 TABLET BY MOUTH ONCE DAILY.  . nitroGLYCERIN (NITROSTAT) 0.4 MG SL tablet Place 1 tablet (0.4 mg total) under the tongue every 5 (five) minutes x 3 doses as needed for chest pain.   No facility-administered encounter medications on file as of 01/18/2021.   Activities of Daily Living In your present state of health, do you have any difficulty performing the following activities: 01/18/2021  Hearing? N  Vision? N  Difficulty concentrating or making  decisions? N  Walking or climbing stairs? Y  Dressing or bathing? N  Doing errands, shopping? N  Preparing Food and eating ? N  Using the Toilet? N  In the past six months, have you accidently leaked urine? N  Do you have problems with loss of bowel control? N  Managing your Medications? N  Managing your Finances? N  Housekeeping or managing your Housekeeping? N  Some recent data might be hidden   Patient Care Team: Carollee Leitz, MD as PCP - General (Family Medicine) Troy Sine, MD as PCP - Cardiology (Cardiology) [name], RN as Case Manager    Assessment:   This is a routine wellness examination for Brooke Rivera.  Exercise Activities and Dietary recommendations Current Exercise Habits: The patient does not participate in regular exercise at present, Exercise limited by: respiratory conditions(s)  Goals    .  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 Self administers medications as prescribed . Attends all scheduled provider appointments . Calls provider  office for new concerns, questions, or BP outside discussed parameters . Checks BP and records as discussed . Follows a low sodium diet/DASH diet . Patient verbalizes understanding of plan to work with pharmacy to have medication changed to pill packs . Continue to use prepackaged pill packs to help with medication compliance     .  Quit Smoking      Fall Risk Fall Risk  01/18/2021 03/23/2020 04/02/2019 03/18/2017 01/13/2015  Falls in the past year? 0 1 0 No No  Number falls in past yr: - 1 0 - -  Risk for fall due to : No Fall Risks History of fall(s);Impaired balance/gait - - -  Follow up Falls prevention discussed - Falls evaluation completed - -   Is the patient's home free of loose throw rugs in walkways, pet beds, electrical cords, etc?   yes      Grab bars in the bathroom? yes      Handrails on the stairs?   yes      Adequate lighting?   yes  Patient rating of health (0-10) scale: 8  Depression Screen PHQ 2/9 Scores 01/18/2021 06/23/2020 04/11/2020 03/23/2020  PHQ - 2 Score 0 0 1 4  PHQ- 9 Score - 0 1 14    Cognitive Function  6CIT Screen 01/18/2021  What Year? 4 points  What month? 0 points  What time? 0 points  Count back from 20 0 points  Months in reverse 0 points  Repeat phrase 0 points  Total Score 4   Immunization History  Administered Date(s) Administered  . Influenza Split 07/06/2011, 08/08/2012  . Influenza Whole 06/06/2010  . Influenza,inj,Quad PF,6+ Mos 09/03/2016, 05/02/2017, 09/02/2018, 05/21/2019  . Pneumococcal Polysaccharide-23 05/21/2019  . Td 06/06/2010   Screening Tests Health Maintenance  Topic Date Due  . COVID-19 Vaccine (1) Never done  . MAMMOGRAM  11/30/2019  . PAP SMEAR-Modifier  12/08/2019  . TETANUS/TDAP  06/06/2020  . INFLUENZA VACCINE  03/27/2021  . COLONOSCOPY (Pts 45-55yrs Insurance coverage will need to be confirmed)  05/14/2022  . Hepatitis C Screening  Completed  . HIV Screening  Completed  .  HPV VACCINES  Aged Out   Cancer  Screenings: Lung: Low Dose CT Chest recommended if Age 85-80 years, 30 pack-year currently smoking OR have quit w/in 15years. Patient does not qualify. Breast:  Up to date on Mammogram? No   Up to date of Bone Density/Dexa? NA Colorectal: UTD  Additional Screenings: Hepatitis C Screening: Completed  HIV Screening: Completed  Pap Smear: Due   Plan:  Pap Smear due. Call to make an apt for this. Declined apt to be made. Consider covid vaccines.  Fill out an advance directive.   I have personally reviewed and noted the following in the patient's chart:   . Medical and social history . Use of alcohol, tobacco or illicit drugs  . Current medications and supplements . Functional ability and status . Nutritional status . Physical activity . Advanced directives . List of other physicians . Hospitalizations, surgeries, and ER visits in previous 12 months . Vitals . Screenings to include cognitive, depression, and falls . Referrals and appointments  In addition, I have reviewed and discussed with patient certain preventive protocols, quality metrics, and best practice recommendations. A written personalized care plan for preventive services as well as general preventive health recommendations were provided to patient.  This visit was conducted virtually in the setting of the [place] pandemic.    Dorna Bloom, [place]  01/18/2021    I have reviewed this visit and agree with the documentation.

## 2021-01-18 NOTE — Patient Instructions (Addendum)
You spoke to Brooke Rivera, [place] over the phone for your annual wellness visit.  We discussed goals: Goals    .  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 Self administers medications as prescribed . Attends all scheduled provider appointments . Calls provider office for new concerns, questions, or BP outside discussed parameters . Checks BP and records as discussed . Follows a low sodium diet/DASH diet . Patient verbalizes understanding of plan to work with pharmacy to have medication changed to pill packs . Continue to use prepackaged pill packs to help with medication compliance     .  Quit Smoking      We also discussed recommended health maintenance. Please call our office and schedule a visit. As discussed, you are due for the following.  Health Maintenance  Topic Date Due  . COVID-19 Vaccine (1) Never done  . MAMMOGRAM  11/30/2019  . PAP SMEAR-Modifier  12/08/2019  . TETANUS/TDAP  06/06/2020  . INFLUENZA VACCINE  03/27/2021  . COLONOSCOPY (Pts 45-36yr Insurance coverage will need to be confirmed)  05/14/2022  . Hepatitis C Screening  Completed  . HIV Screening  Completed  . HPV VACCINES  Aged Out   Pap Smear due. Call to make an apt for this. Declined apt to be made. Consider covid vaccines.  Fill out an advance directive.   We also discussed smoking cessation.  1-800-QUIT-NOW  Preventive Care [id number] Old, Female Preventive care refers to lifestyle choices and visits with your health care provider that can promote health and wellness. This includes:  A yearly physical exam. This is also called an annual wellness visit.  Regular dental and eye exams.  Immunizations.  Screening for certain conditions.  Healthy lifestyle choices, such as: ? Eating a healthy diet. ? Getting regular  exercise. ? Not using drugs or products that contain nicotine and tobacco. ? Limiting alcohol use. What can I expect for my preventive care visit? Physical exam Your health care provider will check your:  Height and weight. These may be used to calculate your BMI (body mass index). BMI is a measurement that tells if you are at a healthy weight.  Heart rate and blood pressure.  Body temperature.  Skin for abnormal spots. Counseling Your health care provider may ask you questions about your:  Past medical problems.  Family's medical history.  Alcohol, tobacco, and drug use.  Emotional well-being.  Home life and relationship well-being.  Sexual activity.  Diet, exercise, and sleep habits.  Work and work eStatistician  Access to firearms.  Method of birth control.  Menstrual cycle.  Pregnancy history. What immunizations do I need? Vaccines are usually given at various ages, according to a schedule. Your health care provider will recommend vaccines for you based on your age, medical history, and lifestyle or other factors, such as travel or where you work.   What tests do I need? Blood tests  Lipid and cholesterol levels. These may be checked every 5 years, or more often if you are over 626years old.  Hepatitis C test.  Hepatitis B test. Screening  Lung cancer screening. You may have this screening  every year starting at age 62 if you have a 30-pack-year history of smoking and currently smoke or have quit within the past 15 years.  Colorectal cancer screening. ? All adults should have this screening starting at age 62 and continuing until age 62. ? Your health care provider may recommend screening at age 19 if you are at increased risk. ? You will have tests every 1-10 years, depending on your results and the type of screening test.  Diabetes screening. ? This is done by checking your blood sugar (glucose) after you have not eaten for a while (fasting). ? You  may have this done every 1-3 years.  Mammogram. ? This may be done every 1-2 years. ? Talk with your health care provider about when you should start having regular mammograms. This may depend on whether you have a family history of breast cancer.  BRCA-related cancer screening. This may be done if you have a family history of breast, ovarian, tubal, or peritoneal cancers.  Pelvic exam and Pap test. ? This may be done every 3 years starting at age 62. ? Starting at age 62, this may be done every 5 years if you have a Pap test in combination with an HPV test. Other tests  STD (sexually transmitted disease) testing, if you are at risk.  Bone density scan. This is done to screen for osteoporosis. You may have this scan if you are at high risk for osteoporosis. Talk with your health care provider about your test results, treatment options, and if necessary, the need for more tests. Follow these instructions at home: Eating and drinking  Eat a diet that includes fresh fruits and vegetables, whole grains, lean protein, and low-fat dairy products.  Take vitamin and mineral supplements as recommended by your health care provider.  Do not drink alcohol if: ? Your health care provider tells you not to drink. ? You are pregnant, may be pregnant, or are planning to become pregnant.  If you drink alcohol: ? Limit how much you have to 0-1 drink a day. ? Be aware of how much alcohol is in your drink. In the [place] one drink equals one 12 oz bottle of beer (355 mL), one 5 oz glass of wine (148 mL), or one 1 oz glass of hard liquor (44 mL).   Lifestyle  Take daily care of your teeth and gums. Brush your teeth every morning and night with fluoride toothpaste. Floss one time each day.  Stay active. Exercise for at least 30 minutes 5 or more days each week.  Do not use any products that contain nicotine or tobacco, such as cigarettes, e-cigarettes, and chewing tobacco. If you need help quitting, ask  your health care provider.  Do not use drugs.  If you are sexually active, practice safe sex. Use a condom or other form of protection to prevent STIs (sexually transmitted infections).  If you do not wish to become pregnant, use a form of birth control. If you plan to become pregnant, see your health care provider for a prepregnancy visit.  If told by your health care provider, take low-dose aspirin daily starting at age 10.  Find healthy ways to cope with stress, such as: ? Meditation, yoga, or listening to music. ? Journaling. ? Talking to a trusted person. ? Spending time with friends and family. Safety  Always wear your seat belt while driving or riding in a vehicle.  Do not drive: ? If you have been drinking alcohol. Do  not ride with someone who has been drinking. ? When you are tired or distracted. ? While texting.  Wear a helmet and other protective equipment during sports activities.  If you have firearms in your house, make sure you follow all gun safety procedures. What's next?  Visit your health care provider once a year for an annual wellness visit.  Ask your health care provider how often you should have your eyes and teeth checked.  Stay up to date on all vaccines. This information is not intended to replace advice given to you by your health care provider. Make sure you discuss any questions you have with your health care provider. Document Revised: 05/17/2020 Document Reviewed: 04/24/2018 Elsevier Patient Education  2021 [place].    Our clinic's number is [phone]. Please call with questions or concerns about what we discussed today.

## 2021-02-15 ENCOUNTER — Other Ambulatory Visit: Payer: Self-pay | Admitting: Family Medicine

## 2021-03-03 ENCOUNTER — Other Ambulatory Visit: Payer: Self-pay | Admitting: Family Medicine

## 2021-03-20 NOTE — Progress Notes (Signed)
    SUBJECTIVE:   CHIEF COMPLAINT / HPI: PAP smear   Gyn concerns/Preventative healthcare Last menstrual period: No LMP recorded. Patient is postmenopausal. Regular periods: postmenapausal Heavy bleeding: no Sexually active: no Contraception or hormonal therapy:no Hx of STD: Patient does not desire STD screening Dyspareunia: No Vaginal discharge: No Dysuria:No  Last mammogram: 2019.  New order placed 05/2020 Breast mass or concerns: No Last pap smear: 2016, NILM, HPV negative    PERTINENT  PMH / PSH:  HTN Hypothyroidism HLD  OBJECTIVE:   BP 126/60   Pulse 79   Ht '5\' 1"'$  (1.549 m)   Wt 125 lb 12.8 oz (57.1 kg)   SpO2 94%   BMI 23.77 kg/m    General: Alert, no acute distress Cardio: Normal S1 and S2, RRR, no r/m/g Pulm: CTAB, normal work of breathing Pelvic Exam chaperoned by CMA Shari        External: without lesions or masses        Vagina: atrophic mucosa        Cervix: without lesions or masses, os closed and difficult for cervical brush to pass through          ASSESSMENT/PLAN:   Cervical cancer screening Not sexually active, does not wasn't STD testing Last PAP 2016 NILM, HPV neg Pap smear: performed Follow up results   Hypothyroidism Asymptomatic Repeat TSH today  Hyperlipidemia Last LDL 115 in 2021.  Goal <100.  Compliant with Atorvastatin. -Repeat Lipids today -Continue Atorvastatin 80 mg daily -Add Zetia 10 mg daily -Repeat Lipids in 6 months  Hypertension, benign Well controlled on antihypertensives -BMet today -Continue Amlodipine 5 mg daily -Follow up as needed      Carollee Leitz, MD [place]

## 2021-03-20 NOTE — Patient Instructions (Addendum)
Thank you for coming to see me today. It was a pleasure.   We will get some labs today.  If they are abnormal or we need to do something about them, I will call you.  If they are normal, I will send you a message on MyChart (if it is active) or a letter in the mail.  If you don't hear from [place] in 2 weeks, please call the office at the number below.   I have placed an order for your mammogram.  Please call [facility] at [phone] to schedule your appointment within one week.   Recommend you talk to your pharmacy about receiving the Shingles vaccine  Please follow-up with PCP in 3 months  If you have any questions or concerns, please do not hesitate to call the office at [phone].  Best,   Carollee Leitz, MD

## 2021-03-22 ENCOUNTER — Other Ambulatory Visit: Payer: Self-pay

## 2021-03-22 ENCOUNTER — Ambulatory Visit (INDEPENDENT_AMBULATORY_CARE_PROVIDER_SITE_OTHER): Payer: Medicare HMO | Admitting: Family Medicine

## 2021-03-22 ENCOUNTER — Encounter: Payer: Self-pay | Admitting: Family Medicine

## 2021-03-22 ENCOUNTER — Telehealth: Payer: Self-pay | Admitting: *Deleted

## 2021-03-22 ENCOUNTER — Other Ambulatory Visit (HOSPITAL_COMMUNITY)
Admission: RE | Admit: 2021-03-22 | Discharge: 2021-03-22 | Disposition: A | Discharge status: Home / Self Care | Payer: Medicare HMO | Source: Ambulatory Visit | Attending: Family Medicine | Admitting: Family Medicine

## 2021-03-22 VITALS — BP 126/60 | HR 79 | Ht 61.0 in | Wt 125.8 lb

## 2021-03-22 DIAGNOSIS — Z01419 Encounter for gynecological examination (general) (routine) without abnormal findings: Secondary | ICD-10-CM | POA: Insufficient documentation

## 2021-03-22 DIAGNOSIS — E039 Hypothyroidism, unspecified: Secondary | ICD-10-CM | POA: Diagnosis not present

## 2021-03-22 DIAGNOSIS — E782 Mixed hyperlipidemia: Secondary | ICD-10-CM

## 2021-03-22 DIAGNOSIS — I10 Essential (primary) hypertension: Secondary | ICD-10-CM

## 2021-03-22 DIAGNOSIS — Z124 Encounter for screening for malignant neoplasm of cervix: Secondary | ICD-10-CM | POA: Insufficient documentation

## 2021-03-22 DIAGNOSIS — R7309 Other abnormal glucose: Secondary | ICD-10-CM

## 2021-03-22 DIAGNOSIS — Z1151 Encounter for screening for human papillomavirus (HPV): Secondary | ICD-10-CM | POA: Diagnosis not present

## 2021-03-22 HISTORY — DX: Encounter for screening for malignant neoplasm of cervix: Z12.4

## 2021-03-22 LAB — POCT GLYCOSYLATED HEMOGLOBIN (HGB A1C): Hemoglobin A1C: 5.3 % (ref 4.0–5.6)

## 2021-03-22 MED ORDER — EZETIMIBE 10 MG PO TABS: 10.0000 mg | ORAL_TABLET | Freq: Every day | ORAL | 3 refills | Status: DC

## 2021-03-22 NOTE — Progress Notes (Signed)
Asked by Dr. Volanda Napoleon to evaluate medication administration punch-out cards.   Patient had two cards.  One contained all of her maintenance medications including Brilinta (ticagrelor).   The second had a 1 week supply of her Brilinta (ticagrelor)which appeared to be duplicate therapy.   The patient agreed to give [place] the extra Brilinta to avoid over dosing.  The Brilinta #7 was removed from her possession and sent for destruction.   Dr. Volanda Napoleon aware.  Patient appreciative of clarification.

## 2021-03-22 NOTE — Chronic Care Management (AMB) (Signed)
  Care Management   Note  03/22/2021 Name: Brooke Rivera MRN: [id number] DOB: 29-Oct-1960  Brooke Rivera is a 60 y.o. year old female who is a primary care patient of Carollee Leitz, MD and is actively engaged with the care management team. I reached out to [name] by phone today to assist with scheduling a follow up visit with the RN Case Manager  Follow up plan: Patient declines further follow up and engagement by the care management team. Appropriate care team members and provider have been notified via electronic communication.  The care management team is available to follow up with the patient after provider conversation with the patient regarding recommendation for care management engagement and subsequent re-referral to the care management team.   [facility]  Direct Dial: [phone]

## 2021-03-22 NOTE — Chronic Care Management (AMB) (Signed)
  Care Management   Note  03/22/2021 Name: Lizet Lenard MRN: [id number] DOB: December 19, 1960  Lucelle Flecker is a 60 y.o. year old female who is a primary care patient of Carollee Leitz, MD and is actively engaged with the care management team. I reached out to [name] by phone today to assist with re-scheduling a follow up visit with the RN Case Manager  Follow up plan: Unsuccessful telephone outreach attempt made. A HIPAA compliant phone message was left for the patient providing contact information and requesting a return call.  The care management team will reach out to the patient again over the next 7 days.  If patient returns call to provider office, please advise to call [place] at [phone].  [facility]  Direct Dial: [phone]

## 2021-03-22 NOTE — Progress Notes (Signed)
This encounter was created in error - please disregard.  This encounter was created in error - please disregard.

## 2021-03-22 NOTE — Assessment & Plan Note (Signed)
Asymptomatic Repeat TSH today

## 2021-03-22 NOTE — Assessment & Plan Note (Signed)
Not sexually active, does not wasn't STD testing Last PAP 2016 NILM, HPV neg Pap smear: performed Follow up results

## 2021-03-23 ENCOUNTER — Encounter: Payer: Self-pay | Admitting: Family Medicine

## 2021-03-23 LAB — LIPID PANEL
Chol/HDL Ratio: 2.9 ratio (ref 0.0–4.4)
Cholesterol, Total: 197 mg/dL (ref 100–199)
HDL: 69 mg/dL (ref >39)
LDL Chol Calc (NIH): 110 mg/dL — ABNORMAL HIGH (ref 0–99)
Triglycerides: 100 mg/dL (ref 0–149)
VLDL Cholesterol Cal: 18 mg/dL (ref 5–40)

## 2021-03-23 LAB — BASIC METABOLIC PANEL
BUN/Creatinine Ratio: 13 (ref 12–28)
BUN: 10 mg/dL (ref 8–27)
CO2: 25 mmol/L (ref 20–29)
Calcium: 9.3 mg/dL (ref 8.7–10.3)
Chloride: 101 mmol/L (ref 96–106)
Creatinine, Ser: 0.77 mg/dL (ref 0.57–1.00)
Glucose: 96 mg/dL (ref 65–99)
Potassium: 4.1 mmol/L (ref 3.5–5.2)
Sodium: 141 mmol/L (ref 134–144)
eGFR: 88 mL/min/{1.73_m2} (ref >59)

## 2021-03-23 LAB — TSH: TSH: 3.36 u[IU]/mL (ref 0.450–4.500)

## 2021-03-23 NOTE — Assessment & Plan Note (Addendum)
Well controlled on antihypertensives -BMet today -Continue Amlodipine 5 mg daily -Follow up as needed

## 2021-03-23 NOTE — Assessment & Plan Note (Signed)
Last LDL 115 in 2021.  Goal <100.  Compliant with Atorvastatin. -Repeat Lipids today -Continue Atorvastatin 80 mg daily -Add Zetia 10 mg daily -Repeat Lipids in 6 months

## 2021-03-27 LAB — CYTOLOGY - PAP
Comment: NEGATIVE
Diagnosis: NEGATIVE
High risk HPV: NEGATIVE

## 2021-03-31 ENCOUNTER — Other Ambulatory Visit: Payer: Self-pay | Admitting: Family Medicine

## 2021-05-04 ENCOUNTER — Other Ambulatory Visit: Payer: Self-pay | Admitting: Family Medicine

## 2021-06-21 ENCOUNTER — Other Ambulatory Visit: Payer: Self-pay | Admitting: Family Medicine

## 2021-06-21 DIAGNOSIS — Z1231 Encounter for screening mammogram for malignant neoplasm of breast: Secondary | ICD-10-CM

## 2021-06-28 ENCOUNTER — Other Ambulatory Visit: Payer: Self-pay | Admitting: Family Medicine

## 2021-07-26 ENCOUNTER — Ambulatory Visit: Payer: Medicare HMO

## 2021-07-27 ENCOUNTER — Other Ambulatory Visit: Payer: Self-pay | Admitting: Cardiovascular Disease

## 2021-08-17 ENCOUNTER — Other Ambulatory Visit: Payer: Self-pay

## 2021-08-17 ENCOUNTER — Encounter: Payer: Self-pay | Admitting: Family Medicine

## 2021-08-17 ENCOUNTER — Ambulatory Visit (INDEPENDENT_AMBULATORY_CARE_PROVIDER_SITE_OTHER): Payer: Medicare HMO | Admitting: Family Medicine

## 2021-08-17 VITALS — BP 126/61 | HR 81 | Ht 61.0 in | Wt 135.0 lb

## 2021-08-17 DIAGNOSIS — F17200 Nicotine dependence, unspecified, uncomplicated: Secondary | ICD-10-CM | POA: Diagnosis not present

## 2021-08-17 DIAGNOSIS — R079 Chest pain, unspecified: Secondary | ICD-10-CM | POA: Diagnosis not present

## 2021-08-17 DIAGNOSIS — N644 Mastodynia: Secondary | ICD-10-CM

## 2021-08-17 DIAGNOSIS — E782 Mixed hyperlipidemia: Secondary | ICD-10-CM | POA: Diagnosis not present

## 2021-08-17 DIAGNOSIS — I10 Essential (primary) hypertension: Secondary | ICD-10-CM

## 2021-08-17 NOTE — Patient Instructions (Addendum)
Thank you for coming to see me today. It was a pleasure.   We will get some labs today.  If they are abnormal or we need to do something about them, I will call you.  If they are normal, I will send you a message on MyChart (if it is active) or a letter in the mail.  If you don't hear from [place] in 2 weeks, please call the office at the number below.   Recommend Shingles vaccine.  This can be administered at your pharmacy.  ECG was normal.  Follow up with the Breast centre to make an appointment for mammogram  Please follow-up with PCP as needed  If you have any questions or concerns, please do not hesitate to call the office at [phone].  Best,   Carollee Leitz, MD

## 2021-08-17 NOTE — Progress Notes (Addendum)
° ° °  SUBJECTIVE:   CHIEF COMPLAINT / HPI: sore breasts  Reports both breast have been hurting for about 4-5 days.  Throbbing pain.  Does not radiate to jaw, shoulder, arm or back.  Denies any chest pain, shortness of breath, diaphoresis, nausea or vomiting. Denies any nipple discharge, breast lumps or skin changes.  Denies any recent trauma.  PERTINENT  PMH / PSH:  Tobacco use HTN STEMI/ RCA HLD Hypothyroid GERD  OBJECTIVE:   BP 126/61    Pulse 81    Ht 5\' 1"  (1.549 m)    Wt 135 lb (61.2 kg)    SpO2 100%    BMI 25.51 kg/m    General: Alert, no acute distress Breast exam: symmetric, skin normal, no nipple discharge, breast lumps or axillary lymphadenopathy.  Anterior chest wall tenderness  Cardio: Normal S1 and S2, RRR, no r/m/g Pulm: CTAB, normal work of breathing   ASSESSMENT/PLAN:   Breast pain Bilateral breast pain. Breast exam benign.  Less likely ACS given Heart score 3 and ECG shows NSR without ST elevation. Patient was unable to follow up from previous appointment for breast mammogram.  CMA called to schedule appointment and was advised to have patient call to schedule given she had not followed up from previous appointment. Patient aware to call to make appointment for breast exam, phone number and directions provided Follow up with results Tylenol prn for discomfort  Hyperlipidemia Repeat lipid panel Follow up with results  TOBACCO USER Encouraged smoking cessation  Hypertension, benign Normotensive Continue current medications   Declined Shingles vaccine  Carollee Leitz, MD [place]

## 2021-08-18 LAB — LIPID PANEL
Chol/HDL Ratio: 3.1 ratio (ref 0.0–4.4)
Cholesterol, Total: 138 mg/dL (ref 100–199)
HDL: 45 mg/dL (ref >39)
LDL Chol Calc (NIH): 77 mg/dL (ref 0–99)
Triglycerides: 85 mg/dL (ref 0–149)
VLDL Cholesterol Cal: 16 mg/dL (ref 5–40)

## 2021-08-22 ENCOUNTER — Encounter: Payer: Self-pay | Admitting: Family Medicine

## 2021-08-22 DIAGNOSIS — N644 Mastodynia: Secondary | ICD-10-CM

## 2021-08-22 HISTORY — DX: Mastodynia: N64.4

## 2021-08-22 NOTE — Assessment & Plan Note (Signed)
Normotensive Continue current medications

## 2021-08-22 NOTE — Assessment & Plan Note (Signed)
Encouraged smoking cessation 

## 2021-08-22 NOTE — Assessment & Plan Note (Signed)
Repeat lipid panel Follow up with results

## 2021-08-22 NOTE — Assessment & Plan Note (Addendum)
Bilateral breast pain. Breast exam benign.  Less likely ACS given Heart score 3 and ECG shows NSR without ST elevation. Patient was unable to follow up from previous appointment for breast mammogram.  CMA called to schedule appointment and was advised to have patient call to schedule given she had not followed up from previous appointment. Patient aware to call to make appointment for breast exam, phone number and directions provided Follow up with results Tylenol prn for discomfort

## 2021-09-21 ENCOUNTER — Other Ambulatory Visit: Payer: Self-pay | Admitting: Cardiovascular Disease

## 2021-10-17 ENCOUNTER — Other Ambulatory Visit: Payer: Self-pay | Admitting: Family Medicine

## 2021-11-06 ENCOUNTER — Other Ambulatory Visit: Payer: Self-pay | Admitting: Cardiovascular Disease

## 2021-11-08 ENCOUNTER — Other Ambulatory Visit: Payer: Self-pay | Admitting: Cardiovascular Disease

## 2021-12-19 ENCOUNTER — Ambulatory Visit
Admission: RE | Admit: 2021-12-19 | Discharge: 2021-12-19 | Disposition: A | Discharge status: Home / Self Care | Payer: Medicare HMO | Source: Ambulatory Visit | Attending: Family Medicine | Admitting: Family Medicine

## 2021-12-19 DIAGNOSIS — Z1231 Encounter for screening mammogram for malignant neoplasm of breast: Secondary | ICD-10-CM

## 2021-12-20 ENCOUNTER — Other Ambulatory Visit: Payer: Self-pay | Admitting: Family Medicine

## 2021-12-20 DIAGNOSIS — N6310 Unspecified lump in the right breast, unspecified quadrant: Secondary | ICD-10-CM

## 2021-12-28 ENCOUNTER — Other Ambulatory Visit: Payer: Self-pay | Admitting: Cardiovascular Disease

## 2021-12-29 ENCOUNTER — Other Ambulatory Visit: Payer: Self-pay | Admitting: Family Medicine

## 2022-01-09 ENCOUNTER — Ambulatory Visit
Admission: RE | Admit: 2022-01-09 | Discharge: 2022-01-09 | Disposition: A | Discharge status: Home / Self Care | Payer: Medicare HMO | Source: Ambulatory Visit | Attending: Family Medicine | Admitting: Family Medicine

## 2022-01-09 ENCOUNTER — Ambulatory Visit: Payer: Medicare HMO

## 2022-01-09 DIAGNOSIS — R922 Inconclusive mammogram: Secondary | ICD-10-CM | POA: Diagnosis not present

## 2022-01-09 DIAGNOSIS — N6310 Unspecified lump in the right breast, unspecified quadrant: Secondary | ICD-10-CM

## 2022-01-18 ENCOUNTER — Encounter: Payer: Self-pay | Admitting: Family Medicine

## 2022-01-18 ENCOUNTER — Ambulatory Visit (INDEPENDENT_AMBULATORY_CARE_PROVIDER_SITE_OTHER): Payer: Medicare HMO | Admitting: Family Medicine

## 2022-01-18 VITALS — BP 131/83 | HR 66 | Ht 61.0 in | Wt 132.0 lb

## 2022-01-18 DIAGNOSIS — I10 Essential (primary) hypertension: Secondary | ICD-10-CM | POA: Diagnosis not present

## 2022-01-18 DIAGNOSIS — I739 Peripheral vascular disease, unspecified: Secondary | ICD-10-CM

## 2022-01-18 DIAGNOSIS — E039 Hypothyroidism, unspecified: Secondary | ICD-10-CM | POA: Diagnosis not present

## 2022-01-18 DIAGNOSIS — R6889 Other general symptoms and signs: Secondary | ICD-10-CM | POA: Diagnosis not present

## 2022-01-18 NOTE — Progress Notes (Signed)
    SUBJECTIVE:   CHIEF COMPLAINT / HPI: leg pain  Patient reports lower leg pain when walking. Relieved when at rest. Reports pain started about 3-4 days ago.  Denies any fevers, numbness, tingling, weakness or lower extremity edema.  No bowel or bladder incontinence.    Hypothyroid Asymptomatic. Compliant with medication daily.  HTN Asymptomatic.  Compliant with medications.  Denies any chest pain, shortness of breath or lower extremity edema.    PERTINENT  PMH / PSH:  HTN Tobacco use HLD STEMI s/p stent  Learning disability  OBJECTIVE:   BP 131/83   Pulse 66   Ht '5\' 1"'$  (1.549 m)   Wt 132 lb (59.9 kg)   SpO2 100%   BMI 24.94 kg/m    General: Alert, no acute distress Cardio: Normal S1 and S2, RRR, no r/m/g Pulm: CTAB, normal work of breathing Extremities: No peripheral edema. Distal pulses diminished but palpable.    ASSESSMENT/PLAN:   Intermittent claudication (HCC) ABI in office showing severe PAD Currently on Ticagrelor 90 mg BID, Atorvastatin 80 mg daily Recommend smoking cessation, offered nicoderm patch today, patient declined Lower extremity dopplers ordered Refer to vascular surgery Follow up as needed  Hypothyroidism Check TSH today Continue current medication Follow up with labs and adjust medication accordingly  Hypertension, benign Normotensive Continue current medications Bmet today Follow up with resuts   Recommend shingles vaccine  Carollee Leitz, MD [place]

## 2022-01-18 NOTE — Patient Instructions (Addendum)
Thank you for coming to see me today. It was a pleasure. Today we talked about:   We will get some labs today.  If they are abnormal or we need to do something about them, I will call you.  If they are normal, I will send you a message on MyChart (if it is active) or a letter in the mail.  If you don't hear from [place] in 2 weeks, please call the office at the number below.   Recommend Shingles vaccine.  This is a 2 dose series and can be given at your local pharmacy.  Please talk to your pharmacist about this.   If you would like some assistance to help quit smoking please schedule an appointment with me to discuss the many ways that we can assist you if and when you decide you are ready to take the next step toward a smoke free lifestyle.   Call you with an appointment for your ultrasound for your legs.  Call Cardiology to schedule an appointment for follow up.  Please follow-up with PCP as needed  If you have any questions or concerns, please do not hesitate to call the office at [phone].  Best,   Carollee Leitz, MD

## 2022-01-20 ENCOUNTER — Encounter: Payer: Self-pay | Admitting: Family Medicine

## 2022-01-20 DIAGNOSIS — I739 Peripheral vascular disease, unspecified: Secondary | ICD-10-CM | POA: Insufficient documentation

## 2022-01-20 NOTE — Assessment & Plan Note (Signed)
Check TSH today Continue current medication Follow up with labs and adjust medication accordingly

## 2022-01-20 NOTE — Assessment & Plan Note (Addendum)
Normotensive Continue current medications Bmet today Follow up with resuts

## 2022-01-20 NOTE — Assessment & Plan Note (Signed)
ABI in office showing severe PAD Currently on Ticagrelor 90 mg BID, Atorvastatin 80 mg daily Recommend smoking cessation, offered nicoderm patch today, patient declined Lower extremity dopplers ordered Refer to vascular surgery Follow up as needed

## 2022-01-23 ENCOUNTER — Other Ambulatory Visit: Payer: Medicare HMO

## 2022-01-23 DIAGNOSIS — I10 Essential (primary) hypertension: Secondary | ICD-10-CM

## 2022-01-23 DIAGNOSIS — E039 Hypothyroidism, unspecified: Secondary | ICD-10-CM

## 2022-01-24 LAB — BASIC METABOLIC PANEL
BUN/Creatinine Ratio: 16 (ref 12–28)
BUN: 13 mg/dL (ref 8–27)
CO2: 20 mmol/L (ref 20–29)
Calcium: 9.7 mg/dL (ref 8.7–10.3)
Chloride: 103 mmol/L (ref 96–106)
Creatinine, Ser: 0.8 mg/dL (ref 0.57–1.00)
Glucose: 91 mg/dL (ref 70–99)
Potassium: 5.1 mmol/L (ref 3.5–5.2)
Sodium: 139 mmol/L (ref 134–144)
eGFR: 83 mL/min/{1.73_m2} (ref >59)

## 2022-01-24 LAB — TSH: TSH: 0.215 u[IU]/mL — ABNORMAL LOW (ref 0.450–4.500)

## 2022-01-26 ENCOUNTER — Other Ambulatory Visit: Payer: Self-pay | Admitting: Cardiovascular Disease

## 2022-02-05 ENCOUNTER — Ambulatory Visit (HOSPITAL_COMMUNITY)
Admission: RE | Admit: 2022-02-05 | Discharge: 2022-02-05 | Disposition: A | Discharge status: Home / Self Care | Payer: Medicare HMO | Source: Ambulatory Visit | Attending: Family Medicine | Admitting: Family Medicine

## 2022-02-05 DIAGNOSIS — R6889 Other general symptoms and signs: Secondary | ICD-10-CM | POA: Insufficient documentation

## 2022-02-05 DIAGNOSIS — I739 Peripheral vascular disease, unspecified: Secondary | ICD-10-CM | POA: Diagnosis not present

## 2022-02-05 NOTE — Progress Notes (Signed)
VASCULAR LAB    ABI has been performed.  See CV proc for preliminary results.   Takela Varden, RVT 02/05/2022, 1:47 PM

## 2022-02-12 ENCOUNTER — Other Ambulatory Visit: Payer: Self-pay | Admitting: Family Medicine

## 2022-02-12 ENCOUNTER — Telehealth: Payer: Self-pay | Admitting: Family Medicine

## 2022-02-12 ENCOUNTER — Encounter: Payer: Medicare HMO | Admitting: Vascular Surgery

## 2022-02-12 MED ORDER — LEVOTHYROXINE SODIUM 88 MCG PO TABS: 88.0000 ug | ORAL_TABLET | ORAL | 3 refills | Status: DC

## 2022-02-12 NOTE — Progress Notes (Signed)
Spoke with patient about results of recent TSH.  Will decrease Levothyroxine from 100 mcg to 88 mcg daily Repeat TSH in 6 weeks.   Carollee Leitz, MD [facility]

## 2022-02-12 NOTE — Telephone Encounter (Signed)
err

## 2022-02-23 ENCOUNTER — Other Ambulatory Visit: Payer: Self-pay | Admitting: Family Medicine

## 2022-02-26 ENCOUNTER — Other Ambulatory Visit: Payer: Self-pay | Admitting: Cardiovascular Disease

## 2022-02-26 ENCOUNTER — Other Ambulatory Visit: Payer: Self-pay | Admitting: Family Medicine

## 2022-02-28 ENCOUNTER — Ambulatory Visit (INDEPENDENT_AMBULATORY_CARE_PROVIDER_SITE_OTHER): Payer: Medicare HMO | Admitting: Vascular Surgery

## 2022-02-28 ENCOUNTER — Encounter: Payer: Self-pay | Admitting: Vascular Surgery

## 2022-02-28 VITALS — BP 153/76 | HR 68 | Temp 98.6°F | Resp 20 | Ht 61.0 in | Wt 135.0 lb

## 2022-02-28 DIAGNOSIS — R6889 Other general symptoms and signs: Secondary | ICD-10-CM | POA: Diagnosis not present

## 2022-02-28 NOTE — Progress Notes (Signed)
Patient ID: Brooke Rivera, female   DOB: [birth year], 61 y.o.   MRN: [id number]  Reason for Consult: New Patient (Initial Visit)   Referred by Carollee Leitz, MD  Subjective:     HPI:  Brooke Rivera is a 61 y.o. female without history of peripheral vascular disease does have a history of coronary artery disease with previous STEMI now on aspirin and statin.  She states that she was having difficulty walking even short distances having cramping in the left calf.  Because of this she quit smoking 3 weeks ago states that she has been walking and is without any claudication at this time.  She denies any previous history of stroke, TIA or amaurosis.  She denies any personal or family history of aneurysm disease.  She denies any tissue loss or rest pain in her bilateral lower extremities and has never had any interventions.  Past Medical History:  Diagnosis Date   Abnormal mammogram 11/29/2017   08/2016: IMPRESSION: 1. Likely benign 6 mm fibroadenoma in the upper outer quadrant of the right breast at posterior depth (10 o'clock, 10 cm from the nipple). 2. 3 mm normal appearing intramammary lymph node in the upper outer quadrant of the right breast (9:30 o'clock, 11 cm from the nipple). 3. Normal appearing lymph node in the low right axilla.   RECOMMENDATION: We discussed management options   Acute systolic heart failure ([place]) 05/21/2019   Allergy    mild    Assistance needed at home 03/25/2020   Bilateral leg cramps 09/03/2016   Breast pain 08/22/2021   Cervical cancer screening 03/22/2021   Cocaine use    Depression    rx not taking at this time   Dry skin 11/22/2017   Goiter, lymphadenoid    thyroid   Healthcare maintenance 06/25/2020   HOT FLASHES 06/06/2010   Qualifier: Diagnosis of  By: Barbra Sarks MD, Apolonio Schneiders     Hx of adenomatous polyp of colon 05/26/2017   Hx of adenomatous polyp of colon 05/26/2017   04/2017 diminutive adenoma and diminutive hyperplastic polyp recall 5 yrs 2023 Gatha Mayer, MD, [facility]    Hyperlipidemia    Hypertension    Loss of weight 11/22/2017   Scotoma 01/01/2014   STEMI (ST elevation myocardial infarction) ([place])    Systolic heart failure (HCC)    Ventricular fibrillation ([place])    Family History  Problem Relation Age of Onset   Hyperlipidemia Mother    Cancer Mother    Cancer Father    Colon cancer Neg Hx    Colon polyps Neg Hx    Esophageal cancer Neg Hx    Rectal cancer Neg Hx    Stomach cancer Neg Hx    Past Surgical History:  Procedure Laterality Date   COLONOSCOPY  04/2017   1 adenoma   CORONARY STENT INTERVENTION N/A 05/19/2019   Procedure: CORONARY STENT INTERVENTION;  Surgeon: Leonie Man, MD;  Location: [place] CV LAB;  Service: Cardiovascular;  Laterality: N/A;   CORONARY/GRAFT ACUTE MI REVASCULARIZATION N/A 05/17/2019   Procedure: Coronary/Graft Acute MI Revascularization;  Surgeon: Troy Sine, MD;  Location: [place] CV LAB;  Service: Cardiovascular;  Laterality: N/A;   LEFT HEART CATH AND CORONARY ANGIOGRAPHY N/A 05/19/2019   Procedure: LEFT HEART CATH AND CORONARY ANGIOGRAPHY;  Surgeon: Leonie Man, MD;  Location: [place] CV LAB;  Service: Cardiovascular;  Laterality: N/A;   LEFT HEART CATH AND CORONARY ANGIOGRAPHY N/A 05/17/2019   Procedure: LEFT HEART CATH AND  CORONARY ANGIOGRAPHY;  Surgeon: Troy Sine, MD;  Location: [place] CV LAB;  Service: Cardiovascular;  Laterality: N/A;   THYROIDECTOMY Bilateral 07/08/2015   Procedure: BILATERAL THYROIDECTOMY;  Surgeon: Ruby Cola, MD;  [facility];  Service: ENT;  Laterality: Bilateral;    Short Social History:  Social History   Tobacco Use   Smoking status: Former    Packs/day: 0.25    Types: Cigarettes    Quit date: 02/08/2022    Years since quitting: 0.0   Smokeless tobacco: Never   Tobacco comments:    4-5 cigs a day   Substance Use Topics   Alcohol use: Yes    Alcohol/week: 0.0 standard drinks of alcohol    Comment: last  occurence 3 weeks ago    Allergies  Allergen Reactions   Cabbage Itching and Swelling    Facial redness    Current Outpatient Medications  Medication Sig Dispense Refill   amLODipine (NORVASC) 5 MG tablet TAKE 1 TABLET BY MOUTH ONCE DAILY. 28 tablet 11   aspirin EC 81 MG tablet Take 1 tablet (81 mg total) by mouth daily. 30 tablet 0   atorvastatin (LIPITOR) 80 MG tablet TAKE 1 TABLET BY MOUTH ONCE DAILY IN THE EVENING 28 tablet 11   ezetimibe (ZETIA) 10 MG tablet TAKE 1 TABLET BY MOUTH ONCE DAILY. 28 tablet 0   levothyroxine (SYNTHROID) 100 MCG tablet Take 100 mcg by mouth daily before breakfast.     lisinopril (ZESTRIL) 40 MG tablet TAKE 1 TABLET BY MOUTH ONCE DAILY. 28 tablet 0   nitroGLYCERIN (NITROSTAT) 0.4 MG SL tablet Place 1 tablet (0.4 mg total) under the tongue every 5 (five) minutes x 3 doses as needed for chest pain. 25 tablet 2   ticagrelor (BRILINTA) 90 MG TABS tablet Take 1 tablet (90 mg total) by mouth 2 (two) times daily. SCHEDULE OFFICE VISIT FOR FUTURE REFILLS. 60 tablet 0   VENTOLIN HFA 108 (90 Base) MCG/ACT inhaler INHALE 2 PUFFS INTO THE LUNGS ONCE EVERY 6 HOURS AS NEEDED FOR WHEEZING OR SHORTNESS OF BREATH. 18 each 11   No current facility-administered medications for this visit.    Review of Systems  Constitutional:  Constitutional negative. HENT: HENT negative.  Eyes: Eyes negative.  Cardiovascular: Positive for claudication.  GI: Gastrointestinal negative.  Musculoskeletal: Musculoskeletal negative.  Skin: Skin negative.  Neurological: Neurological negative. Hematologic: Hematologic/lymphatic negative.  Psychiatric: Psychiatric negative.        Objective:  Objective   Vitals:   02/28/22 1103  BP: (!) 153/76  Pulse: 68  Resp: 20  Temp: 98.6 F (37 C)  SpO2: 99%  Weight: 135 lb (61.2 kg)  Height: '5\' 1"'$  (1.549 m)   Body mass index is 25.51 kg/m.  Physical Exam HENT:     Head: Normocephalic.     Nose: Nose normal.     Mouth/Throat:      Mouth: Mucous membranes are moist.  Eyes:     Pupils: Pupils are equal, round, and reactive to light.  Cardiovascular:     Rate and Rhythm: Normal rate.     Pulses:          Radial pulses are 2+ on the right side and 2+ on the left side.       Femoral pulses are 1+ on the right side and 2+ on the left side.      Popliteal pulses are 0 on the right side and 0 on the left side.  Dorsalis pedis pulses are 0 on the right side and 1+ on the left side.       Posterior tibial pulses are 0 on the right side and 0 on the left side.  Abdominal:     General: Abdomen is flat.     Palpations: Abdomen is soft. There is no mass.  Skin:    General: Skin is warm and dry.     Capillary Refill: Capillary refill takes less than 2 seconds.  Neurological:     General: No focal deficit present.     Mental Status: She is alert.  Psychiatric:        Mood and Affect: Mood normal.        Thought Content: Thought content normal.     Data: ABI Findings:  +---------+------------------+-----+----------+--------+  Right    Rt Pressure (mmHg)IndexWaveform  Comment   +---------+------------------+-----+----------+--------+  Brachial 140                    triphasic           +---------+------------------+-----+----------+--------+  PTA      85                0.61 monophasic          +---------+------------------+-----+----------+--------+  DP       96                0.69 biphasic            +---------+------------------+-----+----------+--------+  Great Toe50                0.36                     +---------+------------------+-----+----------+--------+   +---------+------------------+-----+----------+-------+  Left     Lt Pressure (mmHg)IndexWaveform  Comment  +---------+------------------+-----+----------+-------+  Brachial 120                    triphasic          +---------+------------------+-----+----------+-------+  PTA      87                0.62  monophasic         +---------+------------------+-----+----------+-------+  DP       82                0.59 biphasic           +---------+------------------+-----+----------+-------+  Great Toe52                0.37                    +---------+------------------+-----+----------+-------+   +-------+-----------+-----------+------------+------------+  ABI/TBIToday's ABIToday's TBIPrevious ABIPrevious TBI  +-------+-----------+-----------+------------+------------+  Right  0.69       0.36                                 +-------+-----------+-----------+------------+------------+  Left   0.62       0.37                                 +-------+-----------+-----------+------------+------------+            Summary:  Right: Resting right ankle-brachial index indicates moderate right lower  extremity arterial disease. The right toe-brachial index is abnormal.   Left: Resting left ankle-brachial index indicates moderate left lower  extremity arterial disease. The left toe-brachial index is abnormal.      Assessment/Plan:    61 year old female with left greater than right lower extremity claudication and ABIs to objectively support this.  Given that she has quit smoking her symptoms are improving she may never need intervention.  She will continue walking but she has increased since quitting smoking 3 weeks ago.  Continue aspirin and statin follow-up in 1 year with repeat ABIs unless she has issues prior to this.     Waynetta Sandy MD [facility]

## 2022-03-16 DIAGNOSIS — E039 Hypothyroidism, unspecified: Secondary | ICD-10-CM | POA: Diagnosis not present

## 2022-03-16 DIAGNOSIS — E785 Hyperlipidemia, unspecified: Secondary | ICD-10-CM | POA: Diagnosis not present

## 2022-03-16 DIAGNOSIS — G3184 Mild cognitive impairment, so stated: Secondary | ICD-10-CM | POA: Diagnosis not present

## 2022-03-16 DIAGNOSIS — J45909 Unspecified asthma, uncomplicated: Secondary | ICD-10-CM | POA: Diagnosis not present

## 2022-03-16 DIAGNOSIS — Z7902 Long term (current) use of antithrombotics/antiplatelets: Secondary | ICD-10-CM | POA: Diagnosis not present

## 2022-03-16 DIAGNOSIS — Z7982 Long term (current) use of aspirin: Secondary | ICD-10-CM | POA: Diagnosis not present

## 2022-03-16 DIAGNOSIS — I2510 Atherosclerotic heart disease of native coronary artery without angina pectoris: Secondary | ICD-10-CM | POA: Diagnosis not present

## 2022-03-16 DIAGNOSIS — I10 Essential (primary) hypertension: Secondary | ICD-10-CM | POA: Diagnosis not present

## 2022-03-16 DIAGNOSIS — I252 Old myocardial infarction: Secondary | ICD-10-CM | POA: Diagnosis not present

## 2022-03-23 ENCOUNTER — Other Ambulatory Visit: Payer: Self-pay | Admitting: Family Medicine

## 2022-03-26 ENCOUNTER — Ambulatory Visit: Payer: Medicare HMO | Admitting: Family Medicine

## 2022-05-09 ENCOUNTER — Other Ambulatory Visit: Payer: Self-pay | Admitting: *Deleted

## 2022-05-09 NOTE — Telephone Encounter (Signed)
Patient calls nurse line regarding prescription refills. Patient reports that she has about three days left.   Forwarding to PCP.   Talbot Grumbling, RN

## 2022-05-10 MED ORDER — EZETIMIBE 10 MG PO TABS: 10.0000 mg | ORAL_TABLET | Freq: Every day | ORAL | 0 refills | Status: DC

## 2022-05-10 MED ORDER — LISINOPRIL 40 MG PO TABS: 40.0000 mg | ORAL_TABLET | Freq: Every day | ORAL | 0 refills | Status: DC

## 2022-05-18 ENCOUNTER — Ambulatory Visit (INDEPENDENT_AMBULATORY_CARE_PROVIDER_SITE_OTHER): Payer: Medicare HMO | Admitting: Family Medicine

## 2022-05-18 ENCOUNTER — Encounter: Payer: Self-pay | Admitting: Family Medicine

## 2022-05-18 VITALS — BP 139/80 | HR 82 | Ht 61.0 in | Wt 138.6 lb

## 2022-05-18 DIAGNOSIS — F819 Developmental disorder of scholastic skills, unspecified: Secondary | ICD-10-CM | POA: Diagnosis not present

## 2022-05-18 DIAGNOSIS — E039 Hypothyroidism, unspecified: Secondary | ICD-10-CM | POA: Diagnosis not present

## 2022-05-18 MED ORDER — TICAGRELOR 90 MG PO TABS: 90.0000 mg | ORAL_TABLET | Freq: Two times a day (BID) | ORAL | 0 refills | Status: DC

## 2022-05-18 MED ORDER — ALBUTEROL SULFATE HFA 108 (90 BASE) MCG/ACT IN AERS: INHALATION_SPRAY | RESPIRATORY_TRACT | 11 refills | Status: DC

## 2022-05-18 MED ORDER — ATORVASTATIN CALCIUM 80 MG PO TABS: 80.0000 mg | ORAL_TABLET | Freq: Every evening | ORAL | 11 refills | Status: AC

## 2022-05-18 MED ORDER — AMLODIPINE BESYLATE 5 MG PO TABS: 5.0000 mg | ORAL_TABLET | Freq: Every day | ORAL | 11 refills | Status: DC

## 2022-05-18 MED ORDER — LISINOPRIL 40 MG PO TABS: 40.0000 mg | ORAL_TABLET | Freq: Every day | ORAL | 0 refills | Status: DC

## 2022-05-18 MED ORDER — EZETIMIBE 10 MG PO TABS: 10.0000 mg | ORAL_TABLET | Freq: Every day | ORAL | 0 refills | Status: DC

## 2022-05-18 MED ORDER — LEVOTHYROXINE SODIUM 100 MCG PO TABS: 100.0000 ug | ORAL_TABLET | Freq: Every day | ORAL | 0 refills | Status: DC

## 2022-05-18 NOTE — Patient Instructions (Addendum)
It was great seeing you today!  Today we refilled all of your medications, and we are checking your thyroid levels.  If the thyroid medication needs to be changed I will call you and let you know and send in the new dose to the pharmacy.  Feel free to call with any questions or concerns at any time, at [phone].   Take care,  Dr. Shary Key [facility]

## 2022-05-18 NOTE — Progress Notes (Signed)
    SUBJECTIVE:   CHIEF COMPLAINT / HPI:   Patient was seen in June and TSH was checked, and levothyroxine was decreased from 122mg to 864m. Was advised to follow up in 6 weeks for repeat TSH. States she hasnt had medications in over a week. Feels fine overall.  States that pharmacy said she had to come in, and were waiting on certified documentation from her PCP.  Advised patient that if that happens again to call the clinic and we will make sure we send in medication because we do not want her to go without it.  Requests to have home health aid come back which she has due to her learning disability- would come most days to help bath, cook,   BP 139/80. Needs refill of amlodipine and Lisinopril   Stopped smoking cigarettes 3-4 months ago. This has helped with her claudication   PERTINENT  PMH / PSH: Reviewed   OBJECTIVE:   BP 139/80   Pulse 82   Ht '5\' 1"'$  (1.549 m)   Wt 138 lb 9.6 oz (62.9 kg)   SpO2 100%   BMI 26.19 kg/m    Physical exam General: well appearing, NAD Cardiovascular: RRR, no murmurs Lungs: CTAB. Normal WOB Abdomen: soft, non-distended, non-tender Skin: warm, dry. No edema  ASSESSMENT/PLAN:   No problem-specific Assessment & Plan notes found for this encounter.   Hypothyroidism  TSH in May was 0.215 so Levothyroxine was decreased from 10049mto 51m18mShe states she has been out of her medications for about a week.  Asymptomatic.  Today will recheck TSH and adjust thyroid medication based on blood work.   HTN  BP 139/80.  Currently takes Amlodipine and Lisinopril though has not taken it in a week.  Refilled medications.  Health maintenance Patient declines flu and TDAP vaccines   Patient requests home health aide, states she has had this in the past due to her learning disability the aide has helped with cooking, bathing, etc.  Home health orders placed.  [place]

## 2022-05-19 LAB — TSH: TSH: 1.02 u[IU]/mL (ref 0.450–4.500)

## 2022-05-25 ENCOUNTER — Encounter: Payer: Self-pay | Admitting: Family Medicine

## 2022-05-25 ENCOUNTER — Other Ambulatory Visit: Payer: Self-pay | Admitting: Family Medicine

## 2022-05-25 DIAGNOSIS — F819 Developmental disorder of scholastic skills, unspecified: Secondary | ICD-10-CM

## 2022-05-28 ENCOUNTER — Ambulatory Visit: Payer: Self-pay | Admitting: Licensed Clinical Social Worker

## 2022-05-28 ENCOUNTER — Telehealth: Payer: Self-pay | Admitting: *Deleted

## 2022-05-28 NOTE — Patient Instructions (Signed)
Visit Information  Thank you for taking time to visit with me today. Please don't hesitate to contact me if I can be of assistance to you.   Following are the goals we discussed today:   Goals Addressed               This Visit's Progress     Care Coordination Activities - PCS services (pt-stated)        Patient stated PCS services stopped in February 2023. Patient stated she would call [place] [facility] Phone: [phone] to inquire on what is needed. Patient will contact SW back and advise if a new PCS application is needed.   SW completed SDOH screening. No additional barriers or needs present.          Patient verbalizes understanding of instructions and care plan provided today and agrees to view in [place]. Active MyChart status and patient understanding of how to access instructions and care plan via MyChart confirmed with patient.     The care management team will reach out to the patient again over the next 30 days.   Lenor Derrick , MSW Social Worker [facility]  [phone]

## 2022-05-28 NOTE — Chronic Care Management (AMB) (Signed)
  Care Coordination   Note   05/28/2022 Name: Brooke Rivera MRN: [id number] DOB: [birth year]  Brooke Rivera is a 60 y.o. year old female who sees Shary Key, DO for primary care. I reached out to [employer] by phone today to offer care coordination services.  Brooke Rivera was given information about Care Coordination services today including:   The Care Coordination services include support from the care team which includes your Nurse Coordinator, Clinical Social Worker, or Pharmacist.  The Care Coordination team is here to help remove barriers to the health concerns and goals most important to you. Care Coordination services are voluntary, and the patient may decline or stop services at any time by request to their care team member.   Care Coordination Consent Status: Patient agreed to services and verbal consent obtained.   Follow up plan:  Telephone appointment with care coordination team member scheduled for:  05/28/22  Encounter Outcome:  Pt. Scheduled  [place]  Direct Dial: [phone]

## 2022-05-28 NOTE — Patient Outreach (Signed)
  Care Coordination   Initial Visit Note   05/28/2022 Name: Brooke Rivera MRN: [id number] DOB: [birth year]  Brooke Rivera is a 61 y.o. year old female who sees Shary Key, DO for primary care. I spoke with  Brooke Rivera by phone today.  What matters to the patients health and wellness today?  PCS Services    Goals Addressed               This Visit's Progress     Care Coordination Activities - PCS services (pt-stated)        Patient stated PCS services stopped in February 2023. Patient stated she would call [place] [facility] Phone: [phone] to inquire on what is needed. Patient will contact SW back and advise if a new PCS application is needed.   SW completed SDOH screening. No additional barriers or needs present.        SDOH assessments and interventions completed:  Yes     Care Coordination Interventions Activated:  Yes  Care Coordination Interventions:  Yes, provided   Follow up plan: Follow up call scheduled for within the next 7 days.    Encounter Outcome:  Pt. Visit Completed

## 2022-06-19 ENCOUNTER — Other Ambulatory Visit: Payer: Self-pay | Admitting: *Deleted

## 2022-06-19 MED ORDER — LEVOTHYROXINE SODIUM 100 MCG PO TABS: 100.0000 ug | ORAL_TABLET | Freq: Every day | ORAL | 0 refills | Status: DC

## 2022-06-22 ENCOUNTER — Encounter: Payer: Self-pay | Admitting: Internal Medicine

## 2022-07-07 ENCOUNTER — Other Ambulatory Visit: Payer: Self-pay | Admitting: Family Medicine

## 2022-07-10 ENCOUNTER — Other Ambulatory Visit: Payer: Self-pay

## 2022-07-10 ENCOUNTER — Encounter: Payer: Self-pay | Admitting: Family Medicine

## 2022-07-10 ENCOUNTER — Ambulatory Visit (INDEPENDENT_AMBULATORY_CARE_PROVIDER_SITE_OTHER): Payer: Medicare HMO | Admitting: Family Medicine

## 2022-07-10 VITALS — BP 113/71 | HR 88 | Wt 144.4 lb

## 2022-07-10 DIAGNOSIS — Z742 Need for assistance at home and no other household member able to render care: Secondary | ICD-10-CM

## 2022-07-10 MED ORDER — LISINOPRIL 40 MG PO TABS: 40.0000 mg | ORAL_TABLET | Freq: Every day | ORAL | 0 refills | Status: DC

## 2022-07-10 NOTE — Patient Instructions (Addendum)
It was great seeing you today!  For your home health aid it was discussed with our social worker Wyatt Portela that you would call personal care services at [phone] to inquire on what is needed.   You can then call Wyatt Portela back with what they said.   Milus Height, Arita Miss , MSW Social Worker [facility]  [phone]  Feel free to call with any questions or concerns at any time, at [phone].   Take care,  Dr. Shary Key [facility]

## 2022-07-10 NOTE — Progress Notes (Signed)
    SUBJECTIVE:   CHIEF COMPLAINT / HPI:   Patient presents to discuss paperwork. Received a care plan from insurance that she would like signed. Denies any particular concerns she would like to discuss further/  Patient requests home health aide,which was discussed at last visit and referral placed. Per SW note she advised patient to contact personal care services to see if they need an application and then reach back out to SW Wyatt Portela). Patient states she has not done this yet.   PERTINENT  PMH / PSH: Reviewed   OBJECTIVE:   BP 113/71   Pulse 88   Wt 144 lb 6.4 oz (65.5 kg)   SpO2 100%   BMI 27.28 kg/m    Physical exam General: well appearing, NAD Cardiovascular: RRR, no murmurs Lungs: CTAB. Normal WOB Abdomen: soft, non-distended Skin: warm, dry. No edema  ASSESSMENT/PLAN:   No problem-specific Assessment & Plan notes found for this encounter.   Home health aid request  Patient requests status of home health aid referral. Needs assistance with cleaning, cooking, bathing due to intellectual disability which was discussed at last visit. Gave her the number to contact which was in [place] the social workers last note. Also signed the insurance paperwork she requested and gave it back to her. All questions and concerns addressed.   [place]

## 2022-07-11 ENCOUNTER — Other Ambulatory Visit: Payer: Self-pay | Admitting: Family Medicine

## 2022-07-30 ENCOUNTER — Other Ambulatory Visit: Payer: Self-pay | Admitting: Family Medicine

## 2022-09-03 ENCOUNTER — Encounter: Payer: Self-pay | Admitting: Family Medicine

## 2022-09-03 ENCOUNTER — Ambulatory Visit (INDEPENDENT_AMBULATORY_CARE_PROVIDER_SITE_OTHER): Payer: Medicare HMO | Admitting: Family Medicine

## 2022-09-03 VITALS — BP 105/62 | HR 92 | Temp 98.4°F | Resp 16 | Ht 61.0 in | Wt 147.2 lb

## 2022-09-03 DIAGNOSIS — E039 Hypothyroidism, unspecified: Secondary | ICD-10-CM | POA: Diagnosis not present

## 2022-09-03 DIAGNOSIS — I10 Essential (primary) hypertension: Secondary | ICD-10-CM

## 2022-09-03 DIAGNOSIS — I959 Hypotension, unspecified: Secondary | ICD-10-CM

## 2022-09-03 DIAGNOSIS — E782 Mixed hyperlipidemia: Secondary | ICD-10-CM

## 2022-09-03 NOTE — Progress Notes (Unsigned)
    SUBJECTIVE:   CHIEF COMPLAINT / HPI:   Patient presents to have a form from insurance signed. Denied any particular concerns she wanted to address today. Noticed her blood pressure was very low on arrival (70s/50s) and she stated she felt ok. States she has not been drinking much water. Has been eating ok. Does state she gets this way when she doesn't drink water. Denies dizziness or falling. Advised her to hold blood pressure medication but she says it comes out of the bottle and will all her other meds combined so wouldn't know which one to stop. Does not want blood work today. Feels like thyroid medication needs to be adjusted so agreed to getting labs to check.   Health maintenance Declines tdap    PERTINENT  PMH / PSH: Reviewed   OBJECTIVE:   BP 105/62 (BP Location: Right Arm, Patient Position: Sitting, Cuff Size: Normal)   Pulse 92   Temp 98.4 F (36.9 C)   Resp 16   Ht '5\' 1"'$  (1.549 m)   Wt 147 lb 3.2 oz (66.8 kg)   SpO2 100%   BMI 27.81 kg/m    Physical exam General: well appearing, NAD Cardiovascular: RRR, no murmurs Lungs: CTAB. Normal WOB Abdomen: soft, non-distended, non-tender Skin: warm, dry. No edema  ASSESSMENT/PLAN:   Hypotension BP on arrival Initially 70s/50s and 105/62 on repeat after I had her drink water throughout the visit. Orthostatics obtained which were still low sitting, standing and laying. Suspect due to dehydration though weight has been stable and has actually increased. Advised her to stop taking her blood pressure medication but she states all her medication are combined and she wasn't sure which is which. Recommended returning for follow up with all of her medication with myself or the pharmacy team. Will check TSH, BMP, CBC, lipid panel    Addendum to visit:  Labs resulted on 1/9. Creatinine elevated and patient with AKI, also with elevated white count and low hgb below baseline. Discussed wit Dr. Andria Frames. AKI potentially from over  aggressively treating blood pressure vs dehydration and concern for possible infection. Called patient and left message to schedule to come back in the next day or 2. Also wanted to ask her a more detailed ROS to determine if this could be due to infection, any concern for bleeding, etc. Did recommend on voicemail that she stop all her medications for now until her visit since she is not able to tell which medication is which. When she follows up will repeat blood work and check iron studies. She should stop ASA and may benefit from starting PPI.    [place]

## 2022-09-03 NOTE — Patient Instructions (Addendum)
It was great seeing you today!  You came in to get your form signed and you also had low blood pressure. We are checking some blood work and I would like to see you back for follow up. I have scheduled your appointment for 1/22.   I recommend staying well hydrated which will help your blood pressure.   I will call you if anything is abnormal.   Feel free to call with any questions or concerns at any time, at [phone].   Take care,  Dr. Shary Key [facility]

## 2022-09-04 ENCOUNTER — Telehealth: Payer: Self-pay | Admitting: Family Medicine

## 2022-09-04 ENCOUNTER — Other Ambulatory Visit: Payer: Self-pay

## 2022-09-04 DIAGNOSIS — I959 Hypotension, unspecified: Secondary | ICD-10-CM | POA: Insufficient documentation

## 2022-09-04 LAB — LIPID PANEL
Chol/HDL Ratio: 4.9 ratio — ABNORMAL HIGH (ref 0.0–4.4)
Cholesterol, Total: 226 mg/dL — ABNORMAL HIGH (ref 100–199)
HDL: 46 mg/dL (ref >39)
LDL Chol Calc (NIH): 144 mg/dL — ABNORMAL HIGH (ref 0–99)
Triglycerides: 198 mg/dL — ABNORMAL HIGH (ref 0–149)
VLDL Cholesterol Cal: 36 mg/dL (ref 5–40)

## 2022-09-04 LAB — BASIC METABOLIC PANEL
BUN/Creatinine Ratio: 11 — ABNORMAL LOW (ref 12–28)
BUN: 20 mg/dL (ref 8–27)
CO2: 20 mmol/L (ref 20–29)
Calcium: 9.8 mg/dL (ref 8.7–10.3)
Chloride: 97 mmol/L (ref 96–106)
Creatinine, Ser: 1.78 mg/dL — ABNORMAL HIGH (ref 0.57–1.00)
Glucose: 114 mg/dL — ABNORMAL HIGH (ref 70–99)
Potassium: 5.3 mmol/L — ABNORMAL HIGH (ref 3.5–5.2)
Sodium: 135 mmol/L (ref 134–144)
eGFR: 32 mL/min/{1.73_m2} — ABNORMAL LOW (ref >59)

## 2022-09-04 LAB — CBC
Hematocrit: 31 % — ABNORMAL LOW (ref 34.0–46.6)
Hemoglobin: 9 g/dL — ABNORMAL LOW (ref 11.1–15.9)
MCH: 17.4 pg — ABNORMAL LOW (ref 26.6–33.0)
MCHC: 29 g/dL — ABNORMAL LOW (ref 31.5–35.7)
MCV: 60 fL — ABNORMAL LOW (ref 79–97)
Platelets: 582 10*3/uL — ABNORMAL HIGH (ref 150–450)
RBC: 5.17 x10E6/uL (ref 3.77–5.28)
RDW: 22.8 % — ABNORMAL HIGH (ref 11.7–15.4)
WBC: 13.4 10*3/uL — ABNORMAL HIGH (ref 3.4–10.8)

## 2022-09-04 LAB — TSH: TSH: 0.686 u[IU]/mL (ref 0.450–4.500)

## 2022-09-04 MED ORDER — EZETIMIBE 10 MG PO TABS: 10.0000 mg | ORAL_TABLET | Freq: Every day | ORAL | 0 refills | Status: DC

## 2022-09-04 MED ORDER — TICAGRELOR 90 MG PO TABS: 90.0000 mg | ORAL_TABLET | Freq: Two times a day (BID) | ORAL | 0 refills | Status: DC

## 2022-09-04 MED ORDER — LEVOTHYROXINE SODIUM 100 MCG PO TABS: 100.0000 ug | ORAL_TABLET | Freq: Every day | ORAL | 0 refills | Status: DC

## 2022-09-04 NOTE — Telephone Encounter (Signed)
Called patient to discuss abnormal blood work. Left message. Would like her to come in as soon as possible (1/10 or 1/11) to recheck and get more information. Advised her to stop taking her medication since she is not able to tell the difference between her blood pressure medications and she was very hypotensive at her visit.

## 2022-09-04 NOTE — Assessment & Plan Note (Deleted)
BP on arrival Initially 70s/50s and 105/62 on repeat after I had her drink water throughout the visit. Orthostatics obtained which were still low sitting, standing and laying. Suspect due to dehydration though weight has been stable and has actually increased. Advised her to stop taking her blood pressure medication but she states all her medication are combined and she wasn't sure which is which. Recommended returning for follow up with all of her medication with myself or the pharmacy team. Will check TSH, BMP, CBC, lipid panel

## 2022-09-04 NOTE — Assessment & Plan Note (Signed)
BP on arrival Initially 70s/50s and 105/62 on repeat after I had her drink water throughout the visit. Orthostatics obtained which were still low sitting, standing and laying. Suspect due to dehydration though weight has been stable and has actually increased. Advised her to stop taking her blood pressure medication but she states all her medication are combined and she wasn't sure which is which. Recommended returning for follow up with all of her medication with myself or the pharmacy team. Will check TSH, BMP, CBC, lipid panel

## 2022-09-05 ENCOUNTER — Ambulatory Visit: Payer: Medicare HMO | Admitting: Family Medicine

## 2022-09-06 ENCOUNTER — Encounter: Payer: Self-pay | Admitting: Family Medicine

## 2022-09-06 ENCOUNTER — Ambulatory Visit (INDEPENDENT_AMBULATORY_CARE_PROVIDER_SITE_OTHER): Payer: Medicare HMO | Admitting: Family Medicine

## 2022-09-06 VITALS — BP 127/67 | HR 90 | Wt 145.4 lb

## 2022-09-06 DIAGNOSIS — N179 Acute kidney failure, unspecified: Secondary | ICD-10-CM | POA: Diagnosis not present

## 2022-09-06 DIAGNOSIS — I10 Essential (primary) hypertension: Secondary | ICD-10-CM | POA: Diagnosis not present

## 2022-09-06 DIAGNOSIS — D649 Anemia, unspecified: Secondary | ICD-10-CM

## 2022-09-06 DIAGNOSIS — I952 Hypotension due to drugs: Secondary | ICD-10-CM

## 2022-09-06 NOTE — Progress Notes (Signed)
    SUBJECTIVE:   CHIEF COMPLAINT / HPI:   Follow up for abnormal labs  Patient has not been taking ANY of her medications yesterday or today, per Dr. Avie Echevaria recommendation as she was hypotensive at the last visit and had abnormal labs with elevated Cr .8 baseline to 1.78. Patient mentioned that she does not drink much water and has increased her water intake per Dr. Arby Barrette in the last two days as well. She denies fever, recent illness, or change in medication other than abstaining for the last two days. Denies dizziness.   PERTINENT  PMH / PSH: HTN, Hypothyroidism, H/o STEMI, tobacco use   OBJECTIVE:   BP 127/67   Pulse 90   Wt 145 lb 6 oz (65.9 kg)   SpO2 100%   BMI 27.47 kg/m   General: well appearing, in no acute distress HEENT: moist mucous membranes  CV: RRR, radial pulses equal and palpable, no BLE edema, cap refill < 2 seconds  Resp: Normal work of breathing on room air, CTAB Abd: Soft, non tender, non distended  Neuro: Alert & Oriented x 4    ASSESSMENT/PLAN:   Hypotension Patient's blood pressure has improved today to 127/67 when off of all of her medication. At this point given her history and exam was not concerning for infection or third spacing, or blood loss, hypotension most likely due to over treatment of blood pressure with some hydration. Increase in Cr also most likely due to hypotension from over treatment of BP.  - Counseled patient regarding which pills to stop (Showed her the exact ones in the pill pack)  - Stopped Amlodipine and lisinopril  - BMP, CBC today  - F/u appointment to recheck BP with Dr. Arby Barrette in 2 weeks      Lowry Ram, MD [place]

## 2022-09-06 NOTE — Assessment & Plan Note (Addendum)
Patient's blood pressure has improved today to 127/67 when off of all of her medication. At this point given her history and exam was not concerning for infection or third spacing, or blood loss, hypotension most likely due to over treatment of blood pressure with some hydration. Increase in Cr also most likely due to hypotension from over treatment of BP.  - Counseled patient regarding which pills to stop (Showed her the exact ones in the pill pack)  - Stopped Amlodipine and lisinopril  - BMP, CBC today  - F/u appointment to recheck BP with Dr. Arby Barrette in 2 weeks

## 2022-09-06 NOTE — Patient Instructions (Signed)
Thank you for coming to see Brooke Rivera!   We will be STOPPING your amlodipine and lisinopril. Come back on 2/2 at 11:10 am to check your blood pressure.

## 2022-09-07 LAB — BASIC METABOLIC PANEL
BUN/Creatinine Ratio: 19 (ref 12–28)
BUN: 17 mg/dL (ref 8–27)
CO2: 21 mmol/L (ref 20–29)
Calcium: 9.3 mg/dL (ref 8.7–10.3)
Chloride: 99 mmol/L (ref 96–106)
Creatinine, Ser: 0.9 mg/dL (ref 0.57–1.00)
Glucose: 108 mg/dL — ABNORMAL HIGH (ref 70–99)
Potassium: 5 mmol/L (ref 3.5–5.2)
Sodium: 138 mmol/L (ref 134–144)
eGFR: 72 mL/min/{1.73_m2} (ref >59)

## 2022-09-07 LAB — CBC WITH DIFFERENTIAL/PLATELET
Basophils Absolute: 0.1 10*3/uL (ref 0.0–0.2)
Basos: 1 %
EOS (ABSOLUTE): 0.4 10*3/uL (ref 0.0–0.4)
Eos: 4 %
Hematocrit: 30.2 % — ABNORMAL LOW (ref 34.0–46.6)
Hemoglobin: 8.6 g/dL — ABNORMAL LOW (ref 11.1–15.9)
Immature Grans (Abs): 0 10*3/uL (ref 0.0–0.1)
Immature Granulocytes: 0 %
Lymphocytes Absolute: 1.3 10*3/uL (ref 0.7–3.1)
Lymphs: 15 %
MCH: 17.5 pg — ABNORMAL LOW (ref 26.6–33.0)
MCHC: 28.5 g/dL — ABNORMAL LOW (ref 31.5–35.7)
MCV: 62 fL — ABNORMAL LOW (ref 79–97)
Monocytes Absolute: 0.8 10*3/uL (ref 0.1–0.9)
Monocytes: 8 %
Neutrophils Absolute: 6.5 10*3/uL (ref 1.4–7.0)
Neutrophils: 72 %
Platelets: 663 10*3/uL — ABNORMAL HIGH (ref 150–450)
RBC: 4.91 x10E6/uL (ref 3.77–5.28)
RDW: 22.3 % — ABNORMAL HIGH (ref 11.7–15.4)
WBC: 9.1 10*3/uL (ref 3.4–10.8)

## 2022-09-10 ENCOUNTER — Telehealth: Payer: Self-pay | Admitting: Family Medicine

## 2022-09-10 NOTE — Telephone Encounter (Signed)
Discussed improvement of Cr. Also discussed anemia. Patient denies blood in stool or any other bleeding. Scheduled patient for lab visit to check hgb and iron panel on 1/17 and placed ambulatory referral to GI. She has visit with Dr. Arby Barrette on 1/22.

## 2022-09-10 NOTE — Addendum Note (Signed)
Addended by: Lowry Ram on: 09/10/2022 06:34 PM   Modules accepted: Orders

## 2022-09-12 ENCOUNTER — Other Ambulatory Visit: Payer: Medicare HMO

## 2022-09-12 DIAGNOSIS — D649 Anemia, unspecified: Secondary | ICD-10-CM

## 2022-09-13 LAB — SPECIMEN STATUS REPORT

## 2022-09-14 ENCOUNTER — Telehealth: Payer: Self-pay | Admitting: Family Medicine

## 2022-09-14 LAB — IRON,TIBC AND FERRITIN PANEL
Ferritin: 15 ng/mL (ref 15–150)
Iron Saturation: 3 % — CL (ref 15–55)
Iron: 16 ug/dL — ABNORMAL LOW (ref 27–139)
Total Iron Binding Capacity: 489 ug/dL — ABNORMAL HIGH (ref 250–450)
UIBC: 473 ug/dL — ABNORMAL HIGH (ref 118–369)

## 2022-09-14 LAB — HEMOGLOBIN: Hemoglobin: 8.3 g/dL — ABNORMAL LOW (ref 11.1–15.9)

## 2022-09-14 MED ORDER — FERROUS SULFATE 325 (65 FE) MG PO TABS: 325.0000 mg | ORAL_TABLET | ORAL | 0 refills | Status: AC

## 2022-09-14 NOTE — Telephone Encounter (Signed)
Called patient regarding test results and iron deficiency anemia. Still reports no hematochezia or melena. I informed patient I am prescribing iron and is already scheduled for f/u with her PCP on 2/2.

## 2022-09-14 NOTE — Addendum Note (Signed)
Addended by: Lowry Ram on: 09/14/2022 12:55 PM   Modules accepted: Orders

## 2022-09-17 ENCOUNTER — Ambulatory Visit: Payer: Self-pay | Admitting: Family Medicine

## 2022-09-23 ENCOUNTER — Other Ambulatory Visit: Payer: Self-pay | Admitting: Family Medicine

## 2022-09-28 ENCOUNTER — Ambulatory Visit: Payer: Self-pay | Admitting: Family Medicine

## 2022-10-31 ENCOUNTER — Other Ambulatory Visit: Payer: Self-pay | Admitting: Family Medicine

## 2022-11-23 ENCOUNTER — Other Ambulatory Visit: Payer: Self-pay | Admitting: Family Medicine

## 2022-12-05 DIAGNOSIS — I739 Peripheral vascular disease, unspecified: Secondary | ICD-10-CM | POA: Diagnosis not present

## 2022-12-05 DIAGNOSIS — F1411 Cocaine abuse, in remission: Secondary | ICD-10-CM | POA: Diagnosis not present

## 2022-12-05 DIAGNOSIS — I2510 Atherosclerotic heart disease of native coronary artery without angina pectoris: Secondary | ICD-10-CM | POA: Diagnosis not present

## 2022-12-05 DIAGNOSIS — Z87891 Personal history of nicotine dependence: Secondary | ICD-10-CM | POA: Diagnosis not present

## 2022-12-05 DIAGNOSIS — I509 Heart failure, unspecified: Secondary | ICD-10-CM | POA: Diagnosis not present

## 2022-12-05 DIAGNOSIS — Z0000 Encounter for general adult medical examination without abnormal findings: Secondary | ICD-10-CM | POA: Diagnosis not present

## 2022-12-14 ENCOUNTER — Other Ambulatory Visit: Payer: Self-pay | Admitting: Family Medicine

## 2022-12-18 ENCOUNTER — Encounter: Payer: Self-pay | Admitting: Family Medicine

## 2023-01-17 ENCOUNTER — Other Ambulatory Visit: Payer: Self-pay | Admitting: Family Medicine

## 2023-01-28 ENCOUNTER — Other Ambulatory Visit: Payer: Self-pay

## 2023-01-28 MED ORDER — EZETIMIBE 10 MG PO TABS: 10.0000 mg | ORAL_TABLET | Freq: Every day | ORAL | 0 refills | Status: AC

## 2023-01-28 MED ORDER — TICAGRELOR 90 MG PO TABS: 90.0000 mg | ORAL_TABLET | Freq: Two times a day (BID) | ORAL | 0 refills | Status: AC

## 2023-01-28 MED ORDER — LEVOTHYROXINE SODIUM 100 MCG PO TABS: ORAL_TABLET | ORAL | 0 refills | Status: AC

## 2023-06-13 ENCOUNTER — Other Ambulatory Visit: Payer: Self-pay | Admitting: Family Medicine

## 2023-06-14 MED ORDER — ALBUTEROL SULFATE HFA 108 (90 BASE) MCG/ACT IN AERS: INHALATION_SPRAY | RESPIRATORY_TRACT | 11 refills | Status: DC

## 2023-12-31 ENCOUNTER — Other Ambulatory Visit: Payer: Self-pay | Admitting: Family Medicine

## 2023-12-31 ENCOUNTER — Encounter: Payer: Self-pay | Admitting: Family Medicine

## 2023-12-31 DIAGNOSIS — Z1231 Encounter for screening mammogram for malignant neoplasm of breast: Secondary | ICD-10-CM

## 2023-12-31 DIAGNOSIS — R221 Localized swelling, mass and lump, neck: Secondary | ICD-10-CM

## 2024-01-02 ENCOUNTER — Encounter: Payer: Self-pay | Admitting: Family Medicine

## 2024-01-06 ENCOUNTER — Other Ambulatory Visit

## 2024-01-10 ENCOUNTER — Other Ambulatory Visit

## 2024-01-15 ENCOUNTER — Other Ambulatory Visit

## 2024-01-22 ENCOUNTER — Ambulatory Visit
Admission: RE | Admit: 2024-01-22 | Discharge: 2024-01-22 | Disposition: A | Discharge status: Home / Self Care | Source: Ambulatory Visit | Attending: Family Medicine

## 2024-01-22 ENCOUNTER — Ambulatory Visit

## 2024-01-22 DIAGNOSIS — Z1231 Encounter for screening mammogram for malignant neoplasm of breast: Secondary | ICD-10-CM

## 2024-01-23 ENCOUNTER — Other Ambulatory Visit

## 2024-01-31 ENCOUNTER — Ambulatory Visit
Admission: RE | Admit: 2024-01-31 | Discharge: 2024-01-31 | Disposition: A | Discharge status: Home / Self Care | Source: Ambulatory Visit | Attending: Family Medicine | Admitting: Family Medicine

## 2024-01-31 DIAGNOSIS — R221 Localized swelling, mass and lump, neck: Secondary | ICD-10-CM

## 2024-05-06 ENCOUNTER — Other Ambulatory Visit: Payer: Self-pay | Admitting: Family Medicine

## 2024-05-07 NOTE — Telephone Encounter (Signed)
 Chart reviewed. Rx refilled.

## 2024-05-19 ENCOUNTER — Encounter: Payer: Self-pay | Admitting: Internal Medicine

## 2024-09-01 ENCOUNTER — Telehealth: Payer: Self-pay

## 2024-09-01 NOTE — Telephone Encounter (Signed)
 Called and spoke with patient-advised patient the office would need to schedule her for an OV (as she is on Brilinta ) prior to scheduling her for her colonoscopy; Patient reports she is not ready to schedule the colonoscopy or the OV at this time; she reports she will call the office back to schedule her appts when she is able to complete them; Patient advised to call back to the office at [phone] should questions/concerns arise;  Patient verbalized understanding of information/instructions;
